# Patient Record
Sex: Female | Born: 1935 | Race: White | Hispanic: No | Smoking: Never smoker
Health system: Southern US, Community
[De-identification: ages and names within clinical notes are randomized; demographics above are authoritative.]

## PROBLEM LIST (undated history)

## (undated) DIAGNOSIS — M1990 Unspecified osteoarthritis, unspecified site: Secondary | ICD-10-CM

## (undated) DIAGNOSIS — F29 Unspecified psychosis not due to a substance or known physiological condition: Secondary | ICD-10-CM

## (undated) DIAGNOSIS — M4620 Osteomyelitis of vertebra, site unspecified: Secondary | ICD-10-CM

## (undated) DIAGNOSIS — Z87440 Personal history of urinary (tract) infections: Secondary | ICD-10-CM

## (undated) DIAGNOSIS — F419 Anxiety disorder, unspecified: Secondary | ICD-10-CM

## (undated) DIAGNOSIS — I499 Cardiac arrhythmia, unspecified: Secondary | ICD-10-CM

## (undated) DIAGNOSIS — Z85828 Personal history of other malignant neoplasm of skin: Secondary | ICD-10-CM

## (undated) DIAGNOSIS — IMO0002 Reserved for concepts with insufficient information to code with codable children: Secondary | ICD-10-CM

## (undated) DIAGNOSIS — Z8679 Personal history of other diseases of the circulatory system: Secondary | ICD-10-CM

## (undated) DIAGNOSIS — Z8639 Personal history of other endocrine, nutritional and metabolic disease: Secondary | ICD-10-CM

## (undated) DIAGNOSIS — D72829 Elevated white blood cell count, unspecified: Secondary | ICD-10-CM

## (undated) DIAGNOSIS — E871 Hypo-osmolality and hyponatremia: Secondary | ICD-10-CM

## (undated) DIAGNOSIS — M21371 Foot drop, right foot: Secondary | ICD-10-CM

## (undated) DIAGNOSIS — R197 Diarrhea, unspecified: Secondary | ICD-10-CM

## (undated) DIAGNOSIS — I739 Peripheral vascular disease, unspecified: Secondary | ICD-10-CM

## (undated) DIAGNOSIS — G6181 Chronic inflammatory demyelinating polyneuritis: Secondary | ICD-10-CM

## (undated) DIAGNOSIS — N393 Stress incontinence (female) (male): Secondary | ICD-10-CM

## (undated) DIAGNOSIS — K859 Acute pancreatitis without necrosis or infection, unspecified: Secondary | ICD-10-CM

## (undated) DIAGNOSIS — I341 Nonrheumatic mitral (valve) prolapse: Secondary | ICD-10-CM

## (undated) DIAGNOSIS — D649 Anemia, unspecified: Secondary | ICD-10-CM

## (undated) DIAGNOSIS — N12 Tubulo-interstitial nephritis, not specified as acute or chronic: Secondary | ICD-10-CM

## (undated) DIAGNOSIS — L89309 Pressure ulcer of unspecified buttock, unspecified stage: Secondary | ICD-10-CM

## (undated) DIAGNOSIS — I10 Essential (primary) hypertension: Secondary | ICD-10-CM

## (undated) DIAGNOSIS — A0472 Enterocolitis due to Clostridium difficile, not specified as recurrent: Secondary | ICD-10-CM

## (undated) DIAGNOSIS — K219 Gastro-esophageal reflux disease without esophagitis: Secondary | ICD-10-CM

## (undated) DIAGNOSIS — E119 Type 2 diabetes mellitus without complications: Secondary | ICD-10-CM

## (undated) DIAGNOSIS — I4891 Unspecified atrial fibrillation: Secondary | ICD-10-CM

## (undated) DIAGNOSIS — Z872 Personal history of diseases of the skin and subcutaneous tissue: Secondary | ICD-10-CM

## (undated) DIAGNOSIS — F339 Major depressive disorder, recurrent, unspecified: Secondary | ICD-10-CM

## (undated) DIAGNOSIS — M21372 Foot drop, left foot: Secondary | ICD-10-CM

## (undated) DIAGNOSIS — M316 Other giant cell arteritis: Secondary | ICD-10-CM

## (undated) DIAGNOSIS — R51 Headache: Secondary | ICD-10-CM

## (undated) DIAGNOSIS — H9190 Unspecified hearing loss, unspecified ear: Secondary | ICD-10-CM

## (undated) DIAGNOSIS — R5381 Other malaise: Secondary | ICD-10-CM

## (undated) DIAGNOSIS — R630 Anorexia: Secondary | ICD-10-CM

## (undated) DIAGNOSIS — R5383 Other fatigue: Secondary | ICD-10-CM

## (undated) DIAGNOSIS — IMO0001 Reserved for inherently not codable concepts without codable children: Secondary | ICD-10-CM

## (undated) DIAGNOSIS — Z5189 Encounter for other specified aftercare: Secondary | ICD-10-CM

## (undated) DIAGNOSIS — M545 Low back pain: Secondary | ICD-10-CM

## (undated) HISTORY — DX: Essential (primary) hypertension: I10

## (undated) HISTORY — DX: Major depressive disorder, recurrent, unspecified: F33.9

## (undated) HISTORY — DX: Other malaise: R53.81

## (undated) HISTORY — DX: Hypo-osmolality and hyponatremia: E87.1

## (undated) HISTORY — DX: Reserved for concepts with insufficient information to code with codable children: IMO0002

## (undated) HISTORY — PX: LUMBAR LAMINECTOMY: SHX95

## (undated) HISTORY — DX: Headache: R51

## (undated) HISTORY — DX: Other giant cell arteritis: M31.6

## (undated) HISTORY — DX: Diarrhea, unspecified: R19.7

## (undated) HISTORY — PX: OTHER SURGICAL HISTORY: SHX169

## (undated) HISTORY — DX: Unspecified osteoarthritis, unspecified site: M19.90

## (undated) HISTORY — PX: ABDOMINAL HYSTERECTOMY: SHX81

## (undated) HISTORY — DX: Anemia, unspecified: D64.9

## (undated) HISTORY — DX: Low back pain: M54.5

## (undated) HISTORY — PX: JOINT REPLACEMENT: SHX530

## (undated) HISTORY — DX: Tubulo-interstitial nephritis, not specified as acute or chronic: N12

## (undated) HISTORY — DX: Anorexia: R63.0

## (undated) HISTORY — DX: Other fatigue: R53.83

## (undated) HISTORY — DX: Pressure ulcer of unspecified buttock, unspecified stage: L89.309

## (undated) HISTORY — DX: Unspecified hearing loss, unspecified ear: H91.90

## (undated) HISTORY — DX: Elevated white blood cell count, unspecified: D72.829

## (undated) HISTORY — PX: CHOLECYSTECTOMY: SHX55

---

## 2008-06-21 ENCOUNTER — Encounter: Admission: RE | Admit: 2008-06-21 | Discharge: 2008-06-21 | Payer: Self-pay | Admitting: Family Medicine

## 2008-10-20 ENCOUNTER — Encounter: Payer: Self-pay | Admitting: Family Medicine

## 2008-11-17 ENCOUNTER — Ambulatory Visit: Payer: Self-pay | Admitting: Family Medicine

## 2008-11-17 DIAGNOSIS — I10 Essential (primary) hypertension: Secondary | ICD-10-CM

## 2008-11-17 DIAGNOSIS — M1990 Unspecified osteoarthritis, unspecified site: Secondary | ICD-10-CM | POA: Insufficient documentation

## 2008-11-17 DIAGNOSIS — H9190 Unspecified hearing loss, unspecified ear: Secondary | ICD-10-CM | POA: Insufficient documentation

## 2008-11-17 HISTORY — DX: Unspecified hearing loss, unspecified ear: H91.90

## 2008-11-17 HISTORY — DX: Unspecified osteoarthritis, unspecified site: M19.90

## 2008-11-17 HISTORY — DX: Essential (primary) hypertension: I10

## 2008-11-24 ENCOUNTER — Ambulatory Visit: Payer: Self-pay | Admitting: Family Medicine

## 2008-11-29 ENCOUNTER — Encounter: Payer: Self-pay | Admitting: Family Medicine

## 2008-12-19 ENCOUNTER — Ambulatory Visit: Payer: Self-pay | Admitting: Family Medicine

## 2008-12-19 DIAGNOSIS — R630 Anorexia: Secondary | ICD-10-CM

## 2008-12-19 DIAGNOSIS — R197 Diarrhea, unspecified: Secondary | ICD-10-CM

## 2008-12-19 HISTORY — DX: Diarrhea, unspecified: R19.7

## 2008-12-19 HISTORY — DX: Anorexia: R63.0

## 2008-12-20 LAB — CONVERTED CEMR LAB
Basophils Absolute: 0 10*3/uL (ref 0.0–0.1)
Basophils Relative: 0.3 % (ref 0.0–3.0)
Calcium: 8.2 mg/dL — ABNORMAL LOW (ref 8.4–10.5)
Creatinine, Ser: 0.8 mg/dL (ref 0.4–1.2)
Eosinophils Absolute: 0.2 10*3/uL (ref 0.0–0.7)
Eosinophils Relative: 2.3 % (ref 0.0–5.0)
GFR calc non Af Amer: 72.97 mL/min (ref >60)
Glucose, Bld: 99 mg/dL (ref 70–99)
Lymphocytes Relative: 17 % (ref 12.0–46.0)
MCHC: 35 g/dL (ref 30.0–36.0)
MCV: 91.9 fL (ref 78.0–100.0)
Monocytes Relative: 11.7 % (ref 3.0–12.0)
Neutro Abs: 6 10*3/uL (ref 1.4–7.7)
Potassium: 5.2 meq/L — ABNORMAL HIGH (ref 3.5–5.1)
RDW: 11.8 % (ref 11.5–14.6)
TSH: 1.45 microintl units/mL (ref 0.35–5.50)

## 2008-12-22 ENCOUNTER — Encounter: Payer: Self-pay | Admitting: Family Medicine

## 2008-12-23 ENCOUNTER — Ambulatory Visit: Payer: Self-pay | Admitting: Internal Medicine

## 2008-12-23 ENCOUNTER — Inpatient Hospital Stay (HOSPITAL_COMMUNITY): Admission: EM | Admit: 2008-12-23 | Discharge: 2008-12-26 | Payer: Self-pay | Admitting: Emergency Medicine

## 2008-12-25 ENCOUNTER — Encounter: Payer: Self-pay | Admitting: Speech Pathology

## 2008-12-26 ENCOUNTER — Encounter (INDEPENDENT_AMBULATORY_CARE_PROVIDER_SITE_OTHER): Payer: Self-pay | Admitting: Gastroenterology

## 2009-01-01 ENCOUNTER — Ambulatory Visit: Payer: Self-pay | Admitting: Family Medicine

## 2009-01-01 DIAGNOSIS — R5381 Other malaise: Secondary | ICD-10-CM

## 2009-01-01 DIAGNOSIS — R5383 Other fatigue: Secondary | ICD-10-CM

## 2009-01-01 DIAGNOSIS — E871 Hypo-osmolality and hyponatremia: Secondary | ICD-10-CM

## 2009-01-01 HISTORY — DX: Hypo-osmolality and hyponatremia: E87.1

## 2009-01-01 HISTORY — DX: Other malaise: R53.81

## 2009-01-01 LAB — CONVERTED CEMR LAB: Sodium: 138 meq/L (ref 135–145)

## 2009-01-02 ENCOUNTER — Telehealth: Payer: Self-pay | Admitting: Family Medicine

## 2009-01-02 ENCOUNTER — Ambulatory Visit: Payer: Self-pay | Admitting: Family Medicine

## 2009-01-15 ENCOUNTER — Encounter: Payer: Self-pay | Admitting: Internal Medicine

## 2009-01-16 ENCOUNTER — Ambulatory Visit: Payer: Self-pay | Admitting: Family Medicine

## 2009-01-17 LAB — CONVERTED CEMR LAB
Chloride: 100 meq/L (ref 96–112)
Creatinine, Ser: 0.8 mg/dL (ref 0.4–1.2)
Potassium: 3.9 meq/L (ref 3.5–5.1)
Sodium: 134 meq/L — ABNORMAL LOW (ref 135–145)

## 2009-01-18 ENCOUNTER — Telehealth: Payer: Self-pay | Admitting: Family Medicine

## 2009-01-19 ENCOUNTER — Telehealth (INDEPENDENT_AMBULATORY_CARE_PROVIDER_SITE_OTHER): Payer: Self-pay | Admitting: *Deleted

## 2009-01-22 ENCOUNTER — Ambulatory Visit: Payer: Self-pay | Admitting: Family Medicine

## 2009-01-24 ENCOUNTER — Telehealth: Payer: Self-pay | Admitting: Family Medicine

## 2009-01-31 ENCOUNTER — Encounter: Payer: Self-pay | Admitting: Family Medicine

## 2009-02-01 ENCOUNTER — Ambulatory Visit: Payer: Self-pay | Admitting: Family Medicine

## 2009-02-01 DIAGNOSIS — F339 Major depressive disorder, recurrent, unspecified: Secondary | ICD-10-CM

## 2009-02-01 DIAGNOSIS — L89309 Pressure ulcer of unspecified buttock, unspecified stage: Secondary | ICD-10-CM | POA: Insufficient documentation

## 2009-02-01 HISTORY — DX: Pressure ulcer of unspecified buttock, unspecified stage: L89.309

## 2009-02-01 HISTORY — DX: Major depressive disorder, recurrent, unspecified: F33.9

## 2009-02-12 ENCOUNTER — Telehealth: Payer: Self-pay | Admitting: Family Medicine

## 2009-02-14 ENCOUNTER — Encounter: Payer: Self-pay | Admitting: Internal Medicine

## 2009-02-15 ENCOUNTER — Telehealth: Payer: Self-pay | Admitting: Family Medicine

## 2009-03-02 ENCOUNTER — Ambulatory Visit: Payer: Self-pay | Admitting: Family Medicine

## 2009-03-02 ENCOUNTER — Telehealth (INDEPENDENT_AMBULATORY_CARE_PROVIDER_SITE_OTHER): Payer: Self-pay | Admitting: *Deleted

## 2009-03-14 ENCOUNTER — Telehealth (INDEPENDENT_AMBULATORY_CARE_PROVIDER_SITE_OTHER): Payer: Self-pay | Admitting: *Deleted

## 2009-03-20 ENCOUNTER — Telehealth: Payer: Self-pay | Admitting: Family Medicine

## 2009-04-16 ENCOUNTER — Ambulatory Visit: Payer: Self-pay | Admitting: Family Medicine

## 2009-04-24 ENCOUNTER — Telehealth: Payer: Self-pay | Admitting: Family Medicine

## 2009-05-24 ENCOUNTER — Telehealth: Payer: Self-pay | Admitting: Family Medicine

## 2009-05-29 ENCOUNTER — Encounter: Payer: Self-pay | Admitting: Family Medicine

## 2009-06-04 ENCOUNTER — Ambulatory Visit (HOSPITAL_COMMUNITY): Admission: RE | Admit: 2009-06-04 | Discharge: 2009-06-04 | Payer: Self-pay | Admitting: Surgery

## 2009-06-04 ENCOUNTER — Encounter (INDEPENDENT_AMBULATORY_CARE_PROVIDER_SITE_OTHER): Payer: Self-pay | Admitting: Surgery

## 2009-06-04 HISTORY — PX: TEMPORAL ARTERY BIOPSY / LIGATION: SUR132

## 2009-06-13 ENCOUNTER — Telehealth: Payer: Self-pay | Admitting: Family Medicine

## 2009-06-13 ENCOUNTER — Encounter: Payer: Self-pay | Admitting: Family Medicine

## 2009-06-28 ENCOUNTER — Telehealth: Payer: Self-pay | Admitting: Family Medicine

## 2009-07-20 ENCOUNTER — Encounter: Payer: Self-pay | Admitting: Family Medicine

## 2009-07-20 ENCOUNTER — Ambulatory Visit (HOSPITAL_COMMUNITY): Admission: RE | Admit: 2009-07-20 | Discharge: 2009-07-20 | Payer: Self-pay | Admitting: Rheumatology

## 2009-07-23 ENCOUNTER — Encounter (INDEPENDENT_AMBULATORY_CARE_PROVIDER_SITE_OTHER): Payer: Self-pay | Admitting: *Deleted

## 2009-07-30 ENCOUNTER — Encounter (INDEPENDENT_AMBULATORY_CARE_PROVIDER_SITE_OTHER): Payer: Self-pay | Admitting: *Deleted

## 2009-08-03 ENCOUNTER — Telehealth: Payer: Self-pay | Admitting: Family Medicine

## 2009-08-07 ENCOUNTER — Telehealth: Payer: Self-pay | Admitting: Family Medicine

## 2009-08-20 ENCOUNTER — Telehealth: Payer: Self-pay | Admitting: Family Medicine

## 2009-08-20 ENCOUNTER — Ambulatory Visit: Payer: Self-pay | Admitting: Family Medicine

## 2009-08-20 DIAGNOSIS — D72829 Elevated white blood cell count, unspecified: Secondary | ICD-10-CM

## 2009-08-20 DIAGNOSIS — D649 Anemia, unspecified: Secondary | ICD-10-CM

## 2009-08-20 DIAGNOSIS — M316 Other giant cell arteritis: Secondary | ICD-10-CM

## 2009-08-20 HISTORY — DX: Elevated white blood cell count, unspecified: D72.829

## 2009-08-20 HISTORY — DX: Other giant cell arteritis: M31.6

## 2009-08-20 HISTORY — DX: Anemia, unspecified: D64.9

## 2009-08-22 LAB — CONVERTED CEMR LAB
ALT: 22 units/L (ref 0–35)
AST: 21 units/L (ref 0–37)
Albumin: 3.4 g/dL — ABNORMAL LOW (ref 3.5–5.2)
BUN: 31 mg/dL — ABNORMAL HIGH (ref 6–23)
Basophils Absolute: 0.1 10*3/uL (ref 0.0–0.1)
Creatinine, Ser: 1.4 mg/dL — ABNORMAL HIGH (ref 0.4–1.2)
Eosinophils Relative: 0.1 % (ref 0.0–5.0)
Ferritin: 51.9 ng/mL (ref 10.0–291.0)
Glucose, Bld: 143 mg/dL — ABNORMAL HIGH (ref 70–99)
HCT: 31.7 % — ABNORMAL LOW (ref 36.0–46.0)
Hemoglobin: 10.4 g/dL — ABNORMAL LOW (ref 12.0–15.0)
Iron: 58 ug/dL (ref 42–145)
Lymphocytes Relative: 4.6 % — ABNORMAL LOW (ref 12.0–46.0)
Lymphs Abs: 0.9 10*3/uL (ref 0.7–4.0)
MCHC: 32.7 g/dL (ref 30.0–36.0)
MCV: 89.9 fL (ref 78.0–100.0)
Monocytes Absolute: 0.5 10*3/uL (ref 0.1–1.0)
Monocytes Relative: 2.4 % — ABNORMAL LOW (ref 3.0–12.0)
Neutro Abs: 17.4 10*3/uL — ABNORMAL HIGH (ref 1.4–7.7)
Neutrophils Relative %: 92.5 % — ABNORMAL HIGH (ref 43.0–77.0)
Potassium: 5.1 meq/L (ref 3.5–5.1)
RBC: 3.52 M/uL — ABNORMAL LOW (ref 3.87–5.11)
Total Bilirubin: 0.6 mg/dL (ref 0.3–1.2)
Total Protein: 6 g/dL (ref 6.0–8.3)
WBC: 18.9 10*3/uL (ref 4.5–10.5)

## 2009-08-30 ENCOUNTER — Telehealth: Payer: Self-pay | Admitting: Family Medicine

## 2009-09-04 ENCOUNTER — Inpatient Hospital Stay (HOSPITAL_COMMUNITY): Admission: EM | Admit: 2009-09-04 | Discharge: 2009-09-06 | Payer: Self-pay | Admitting: Emergency Medicine

## 2009-09-06 ENCOUNTER — Telehealth: Payer: Self-pay | Admitting: Family Medicine

## 2009-09-10 ENCOUNTER — Ambulatory Visit: Payer: Self-pay | Admitting: Family Medicine

## 2009-09-10 DIAGNOSIS — N12 Tubulo-interstitial nephritis, not specified as acute or chronic: Secondary | ICD-10-CM

## 2009-09-10 HISTORY — DX: Tubulo-interstitial nephritis, not specified as acute or chronic: N12

## 2009-09-10 LAB — CONVERTED CEMR LAB
Blood in Urine, dipstick: NEGATIVE
Nitrite: NEGATIVE
Protein, U semiquant: NEGATIVE
Specific Gravity, Urine: <1.005
Urobilinogen, UA: 0.2

## 2009-09-12 ENCOUNTER — Ambulatory Visit: Payer: Self-pay | Admitting: Family Medicine

## 2009-09-14 ENCOUNTER — Telehealth: Payer: Self-pay | Admitting: Family Medicine

## 2009-09-21 ENCOUNTER — Encounter: Payer: Self-pay | Admitting: Family Medicine

## 2009-09-24 ENCOUNTER — Telehealth: Payer: Self-pay | Admitting: Family Medicine

## 2009-09-24 ENCOUNTER — Ambulatory Visit: Payer: Self-pay | Admitting: Family Medicine

## 2009-09-24 LAB — CONVERTED CEMR LAB
CO2: 29 meq/L (ref 19–32)
Calcium: 8.9 mg/dL (ref 8.4–10.5)
Creatinine, Ser: 1 mg/dL (ref 0.4–1.2)
Eosinophils Absolute: 0.4 10*3/uL (ref 0.0–0.7)
Lymphocytes Relative: 9 % — ABNORMAL LOW (ref 12–46)
Lymphs Abs: 1.8 10*3/uL (ref 0.7–4.0)
MCHC: 35.6 g/dL (ref 30.0–36.0)
MCV: 88.9 fL (ref 78.0–100.0)
Monocytes Absolute: 0.6 10*3/uL (ref 0.1–1.0)
Monocytes Relative: 3 % (ref 3–12)
Neutro Abs: 16.9 10*3/uL — ABNORMAL HIGH (ref 1.7–7.7)
Platelets: 460 10*3/uL — ABNORMAL HIGH (ref 150–400)
Potassium: 4.3 meq/L (ref 3.5–5.1)
RDW: 18.9 % — ABNORMAL HIGH (ref 11.5–15.5)
Sed Rate: 13 mm/hr (ref 0–22)
WBC: 19.7 10*3/uL — ABNORMAL HIGH (ref 4.0–10.5)

## 2009-10-03 ENCOUNTER — Telehealth: Payer: Self-pay | Admitting: Family Medicine

## 2009-10-09 ENCOUNTER — Encounter: Payer: Self-pay | Admitting: Family Medicine

## 2009-10-09 ENCOUNTER — Telehealth: Payer: Self-pay | Admitting: Family Medicine

## 2009-10-10 ENCOUNTER — Encounter: Payer: Self-pay | Admitting: Family Medicine

## 2009-10-11 ENCOUNTER — Telehealth: Payer: Self-pay | Admitting: Family Medicine

## 2009-10-11 ENCOUNTER — Encounter: Payer: Self-pay | Admitting: Family Medicine

## 2009-10-16 ENCOUNTER — Telehealth: Payer: Self-pay | Admitting: Family Medicine

## 2009-10-19 ENCOUNTER — Telehealth: Payer: Self-pay | Admitting: Family Medicine

## 2009-10-24 ENCOUNTER — Encounter: Payer: Self-pay | Admitting: Family Medicine

## 2009-11-27 ENCOUNTER — Encounter: Payer: Self-pay | Admitting: Family Medicine

## 2010-01-07 ENCOUNTER — Encounter: Payer: Self-pay | Admitting: Family Medicine

## 2010-01-09 ENCOUNTER — Telehealth: Payer: Self-pay | Admitting: Family Medicine

## 2010-01-17 ENCOUNTER — Telehealth: Payer: Self-pay | Admitting: Family Medicine

## 2010-01-18 ENCOUNTER — Inpatient Hospital Stay (HOSPITAL_COMMUNITY)
Admission: EM | Admit: 2010-01-18 | Discharge: 2010-01-21 | Payer: Self-pay | Source: Ambulatory Visit | Admitting: Emergency Medicine

## 2010-02-14 ENCOUNTER — Ambulatory Visit: Payer: Self-pay | Admitting: Family Medicine

## 2010-02-21 ENCOUNTER — Telehealth: Payer: Self-pay | Admitting: Family Medicine

## 2010-02-23 ENCOUNTER — Encounter: Payer: Self-pay | Admitting: Family Medicine

## 2010-03-18 ENCOUNTER — Encounter: Payer: Self-pay | Admitting: Family Medicine

## 2010-03-18 ENCOUNTER — Telehealth: Payer: Self-pay | Admitting: Family Medicine

## 2010-03-27 ENCOUNTER — Encounter: Payer: Self-pay | Admitting: Family Medicine

## 2010-03-29 ENCOUNTER — Encounter: Payer: Self-pay | Admitting: Family Medicine

## 2010-04-02 ENCOUNTER — Ambulatory Visit: Payer: Self-pay | Admitting: Family Medicine

## 2010-04-04 ENCOUNTER — Ambulatory Visit: Payer: Self-pay | Admitting: Family Medicine

## 2010-04-16 ENCOUNTER — Ambulatory Visit: Payer: Self-pay | Admitting: Family Medicine

## 2010-04-16 DIAGNOSIS — IMO0002 Reserved for concepts with insufficient information to code with codable children: Secondary | ICD-10-CM

## 2010-04-16 HISTORY — DX: Reserved for concepts with insufficient information to code with codable children: IMO0002

## 2010-04-18 ENCOUNTER — Ambulatory Visit: Payer: Self-pay | Admitting: Family Medicine

## 2010-04-18 DIAGNOSIS — R51 Headache: Secondary | ICD-10-CM

## 2010-04-18 DIAGNOSIS — R519 Headache, unspecified: Secondary | ICD-10-CM | POA: Insufficient documentation

## 2010-04-18 HISTORY — DX: Headache: R51

## 2010-04-24 ENCOUNTER — Telehealth: Payer: Self-pay | Admitting: Family Medicine

## 2010-04-27 ENCOUNTER — Ambulatory Visit: Payer: Self-pay | Admitting: Family Medicine

## 2010-05-13 ENCOUNTER — Ambulatory Visit: Payer: Self-pay | Admitting: Family Medicine

## 2010-05-21 ENCOUNTER — Telehealth: Payer: Self-pay | Admitting: Family Medicine

## 2010-05-24 ENCOUNTER — Telehealth: Payer: Self-pay | Admitting: Family Medicine

## 2010-05-31 ENCOUNTER — Telehealth: Payer: Self-pay | Admitting: Family Medicine

## 2010-06-13 ENCOUNTER — Ambulatory Visit: Payer: Self-pay | Admitting: Family Medicine

## 2010-07-03 ENCOUNTER — Telehealth: Payer: Self-pay | Admitting: Family Medicine

## 2010-07-29 ENCOUNTER — Ambulatory Visit: Payer: Self-pay | Admitting: Family Medicine

## 2010-07-29 DIAGNOSIS — M545 Low back pain, unspecified: Secondary | ICD-10-CM

## 2010-07-29 HISTORY — DX: Low back pain, unspecified: M54.50

## 2010-08-02 ENCOUNTER — Telehealth: Payer: Self-pay | Admitting: Family Medicine

## 2010-08-29 ENCOUNTER — Telehealth: Payer: Self-pay | Admitting: Family Medicine

## 2010-09-09 ENCOUNTER — Ambulatory Visit
Admission: RE | Admit: 2010-09-09 | Discharge: 2010-09-09 | Payer: Self-pay | Source: Home / Self Care | Attending: Family Medicine | Admitting: Family Medicine

## 2010-09-09 ENCOUNTER — Other Ambulatory Visit: Payer: Self-pay | Admitting: Family Medicine

## 2010-09-09 DIAGNOSIS — T148XXA Other injury of unspecified body region, initial encounter: Secondary | ICD-10-CM | POA: Insufficient documentation

## 2010-09-09 LAB — BASIC METABOLIC PANEL
BUN: 20 mg/dL (ref 6–23)
CO2: 30 mEq/L (ref 19–32)
Calcium: 9.3 mg/dL (ref 8.4–10.5)
Chloride: 97 mEq/L (ref 96–112)
Creatinine, Ser: 0.7 mg/dL (ref 0.4–1.2)
GFR: 80.76 mL/min (ref >60.00)
Glucose, Bld: 75 mg/dL (ref 70–99)
Potassium: 3.8 mEq/L (ref 3.5–5.1)
Sodium: 135 mEq/L (ref 135–145)

## 2010-09-20 ENCOUNTER — Encounter: Payer: Self-pay | Admitting: Family Medicine

## 2010-09-20 ENCOUNTER — Ambulatory Visit
Admission: RE | Admit: 2010-09-20 | Discharge: 2010-09-20 | Payer: Self-pay | Source: Home / Self Care | Attending: Family Medicine | Admitting: Family Medicine

## 2010-09-26 ENCOUNTER — Encounter: Payer: Self-pay | Admitting: Family Medicine

## 2010-09-26 ENCOUNTER — Telehealth: Payer: Self-pay | Admitting: Family Medicine

## 2010-10-01 NOTE — Assessment & Plan Note (Signed)
Summary: rov/mm//infected boil on back.Marland Kitchenlh   Vital Signs:  Patient profile:   77 year old female Temp:     98.8 degrees F oral BP sitting:   170 / 100  (left arm) Cuff size:   regular  Vitals Entered By: Sid Falcon LPN (September 12, 2009 4:07 PM) CC: boil on buttock   History of Present Illness: Patient seen with ulceration buttock area. She has known pressure ulcers and followed by home health nurse. There is concern that one had a yellowish discoloration superiorly. Also they noticed a firm nodular density right buttock.  There was some concern about whether this was an abscess. No fever.  they are using some sort of topical cream for her superficial pressure ulcers and they plan to call back with the name of that. Her methotrexate was stopped by rheumatologist yesterday.  Allergies: 1)  ! Epinephrine Hcl (Epinephrine Hcl) 2)  ! Lotensin  Past History:  Past Medical History: Last updated: 08/20/2009 Arthritis Hypertension migraines Blood Tranfusion Mitral Valve Prolapse Osteoarthritis Peripheral neuropathy Temperal arteritis  Review of Systems      See HPI  Physical Exam  General:  Well-developed,well-nourished,in no acute distress; alert,appropriate and cooperative throughout examination Lungs:  Normal respiratory effort, chest expands symmetrically. Lungs are clear to auscultation, no crackles or wheezes. Heart:  normal rate and regular rhythm.   Skin:  patient has couple of this small superficial pressure ulcerations one near the lower sacral area which is approximately 1/2 by three-quarter centimeter in size. There is some yellowish exudative material but no purulent drainage  and no surrounding erythema. No fluctuance. She has a smaller ulcer which has almost fully healed left lower buttock   Impression & Recommendations:  Problem # 1:  PRESSURE ULCER BUTTOCK (ICD-707.05) No cellulitis or abscess findings on exam today.  Cont good topical wound care and  frequent shift of position.  Problem # 2:  LIPOMA (ICD-214.9) lipomatous mass R buttock appears benign.  They are reassurred this is not an abcess.  Complete Medication List: 1)  Neurontin 100 Mg Caps (Gabapentin) .... Two tabs at bedtime 2)  Lisinopril-hydrochlorothiazide 20-12.5 Mg Tabs (Lisinopril-hydrochlorothiazide) .... One tab daily 3)  Diazepam 5 Mg Tabs (Diazepam) .... One tab daily as needed 4)  Hydrocodone-acetaminophen 10-500 Mg Tabs (Hydrocodone-acetaminophen) .... One tab three times a day as needed 5)  Amlodipine Besylate 2.5 Mg Tabs (Amlodipine besylate) .... One by mouth once daily 6)  Calcium 500/d 500-200 Mg-unit Tabs (Calcium carbonate-vitamin d) .... Twice daily 7)  Imodium A-d 2 Mg Tabs (Loperamide hcl) .... As needed 8)  Butalbital-apap-caffeine 50-325-40 Mg Tabs (Butalbital-apap-caffeine) .... One tab every 6 hours as needed for headache 9)  Tagamet Hb 200 Mg Tabs (Cimetidine) .... One tab two times a day

## 2010-10-01 NOTE — Miscellaneous (Signed)
Summary: Certification and Plan of Care/[facility]  Certification and Plan of Care/[facility]   Imported By: Maryln Gottron 05/02/2010 10:58:06  _____________________________________________________________________  External Attachment:    Type:   Image     Comment:   External Document

## 2010-10-01 NOTE — Progress Notes (Signed)
Summary: refill  Phone Note Refill Request Message from:  Fax from Pharmacy  Refills Requested: Medication #1:  HYDROCODONE-ACETAMINOPHEN 10-500 MG TABS one tab three times a day as needed   Last Ref[facility]: [facility] [phone]     fax---[phone]  Initial call taken by: Warnell Forester,  September 06, 2009 12:50 PM  Follow-up for Phone Call        I checked with Ray, pharmicist at [facility].  He states thie request was a mistake, they do have 12/21 Rx for #90 with 1 refill. Follow-up by: Sid Falcon LPN,  September 07, 2009 10:18 AM

## 2010-10-01 NOTE — Progress Notes (Signed)
Summary: Diazepam refill request  Phone Note From Pharmacy   [facility]  [place] [address] Call For: Burchette/Fry  Summary of Call: Faxed request for Diazepam 5mg , last filled [facility]/04/10 [facility] [place] Initial call taken by: Sid Falcon LPN,  August 02, 2010 3:48 PM  Follow-up for Phone Call        call in #30 with no rf  Follow-up by: Nelwyn Salisbury MD,  August 02, 2010 5:30 PM  Additional Follow-up for Phone Call Additional follow up Details #1::        called Additional Follow-up by: Pura Spice, RN,  August 02, 2010 5:42 PM    Prescriptions: DIAZEPAM 5 MG TABS (DIAZEPAM) one tab daily as needed  #30 x 0   Entered by:   Pura Spice, RN   Authorized by:   Nelwyn Salisbury MD   Signed by:   Pura Spice, RN on 08/02/2010   Method used:   Telephoned to ...       [facility]  [place] [address] ([facility]ail)       [address]       [place], [place]  [address]       Ph: [phone] or [phone]       Fax: [phone]   RxID:   [id number]

## 2010-10-01 NOTE — Miscellaneous (Signed)
Summary: [facility]  [facility]   Imported By: Maryln Gottron 04/03/2010 10:04:34  _____________________________________________________________________  External Attachment:    Type:   Image     Comment:   External Document

## 2010-10-01 NOTE — Progress Notes (Signed)
Summary: Order for UA, given  Phone Note From Other Clinic   Caller: Nurse [facility] CARE) Neysa Bonito [phone] Request: Talk with Provider Summary of Call: Neysa Bonito, RN w/ [facility] called to adv that pt has been complaining of burning / discomfort while urinating,   frequency associated with same... would like to do a UA but has to have an order for same.Marland Kitchen...will be seeing pt around 10am this morning.... Can you advise order for UA to be done/taken by Neysa Bonito, RN w/ [facility]??   Initial call taken by: Debbra Riding,  October 09, 2009 8:35 AM  Follow-up for Phone Call        [facility] back, verbal order given.  UA will be sent to [facility] and we should have the results soon Follow-up by: Sid Falcon LPN,  October 09, 2009 8:46 AM  Additional Follow-up for Phone Call Additional follow up Details #1::        agree with advice. Additional Follow-up by: Evelena Peat MD,  October 09, 2009 12:35 PM

## 2010-10-01 NOTE — Medication Information (Signed)
Summary: Order for [facility]  Order for [facility]   Imported By: Maryln Gottron 02/27/2010 12:14:48  _____________________________________________________________________  External Attachment:    Type:   Image     Comment:   External Document

## 2010-10-01 NOTE — Miscellaneous (Signed)
Summary: Physician's Orders/[facility]  Physician's Orders/[facility]   Imported By: Maryln Gottron 10/12/2009 10:53:39  _____________________________________________________________________  External Attachment:    Type:   Image     Comment:   External Document

## 2010-10-01 NOTE — Assessment & Plan Note (Signed)
Summary: tb inj/njr  Nurse Visit   Allergies: 1)  ! Epinephrine Hcl (Epinephrine Hcl) 2)  ! Lotensin  Immunizations Administered:  PPD Skin Test:    Vaccine Type: PPD    Site: left forearm    Mfr: Sanofi Pasteur    Dose: 0.1 ml    Route: ID    Given by: Sid Falcon LPN    Exp. Date: 06/14/2011    Lot #: [id number]  Orders Added: 1)  TB Skin Test [86580] 2)  Admin 1st Vaccine [phone]

## 2010-10-01 NOTE — Letter (Signed)
Summary: TB Skin Test  All     ,     Phone:   Fax:           TB Skin Test    Jo Alvarez    Date TB Test Placed:  ______8/2/2011________  L or R forearm  TB Test Placed by:  _________[name] Neckers LPN__________  Lot #:  ____[id number]_        Expiration Date: _____10-13-2012________  Date TB Test Read:  _____8/4/2011_______________    Result ______<5 (neg)_____MM  TB Test Read by:  _____[name] Neckers LPN__________

## 2010-10-01 NOTE — Progress Notes (Signed)
Summary: [facility] req referal for Hearing Test  Phone Note From Other Clinic Call back at [facility]  [phone] [place]   Caller: [facility]   - [place] Summary of Call: Pt is needing to get an Audiology Referral or hearing test to determine change in hearing. Pls call [phone]     Initial call taken by: Lucy Antigua,  May 21, 2010 11:51 AM  Follow-up for Phone Call        OK to refer. Follow-up by: Evelena Peat MD,  May 21, 2010 1:10 PM

## 2010-10-01 NOTE — Progress Notes (Signed)
Summary: Refill Diazepam & Hydo-Acet  Phone Note From Pharmacy   Summary of Call: Ok to refill diazepam 5 mg? Last refill was on 12-19-09,on med list, last office vist 05-13-10 Ok to refill Hydrocodon-acetaminophn? 10-500 last refill: 04/24/10 Initial call taken by: Kathrynn Speed CMA,  May 31, 2010 3:04 PM  Follow-up for Phone Call        OK for both RF Follow-up by: Gordy Savers  MD,  May 31, 2010 3:15 PM    Prescriptions: DIAZEPAM 5 MG TABS (DIAZEPAM) one tab daily as needed  #30 x 0   Entered by:   Kathrynn Speed CMA   Authorized by:   Evelena Peat MD   Signed by:   Kathrynn Speed CMA on 05/31/2010   Method used:   Historical   RxID:   [id number] HYDROCODONE-ACETAMINOPHEN 10-500 MG TABS (HYDROCODONE-ACETAMINOPHEN) one tab three times a day as needed  #90 x 0   Entered by:   Kathrynn Speed CMA   Authorized by:   Evelena Peat MD   Signed by:   Kathrynn Speed CMA on 05/31/2010   Method used:   Historical   RxID:   [id number]

## 2010-10-01 NOTE — Assessment & Plan Note (Signed)
Summary: 2 WK ROV//SLM son rsc/njr   Vital Signs:  Patient profile:   77 year old female Weight:      141 pounds O2 Sat:      96 % on Room air Temp:     98.7 degrees F oral Pulse rate:   84 / minute Pulse rhythm:   regular BP sitting:   140 / 64  (left arm) BP standing:   132 / 60 Cuff size:   regular  Vitals Entered By: Sid Falcon LPN (September 10, 2009 10:00 AM)  O2 Flow:  Room air  Serial Vital Signs/Assessments:  Time      Position  BP       Pulse  Resp  Temp     By           Lying RA  168/70                         Evelena Peat MD           Sitting   160/70                         Evelena Peat MD           Standing  132/60                         Evelena Peat MD  CC: Post hospital visit   History of Present Illness: Hospital followup. Patient was admitted recently after a fall at home. Lab work revealed a pyuria and a urine culture grew out Escherichia coli. She was diagnosed with pyelonephritis. Discharged on Cipro and has one more day of therapy. Has some chronic urine frequency but no burning with urination.  Patient has temporal arteritis on chronic steroids also psoriatic arthritis on methotrexate. He does have some generalized weakness. Recent fall and had x-rays including lumbar spine films, pelvic x-ray, and head CT which showed no acute abnormalities. CT of the spine revealed degenerative spondylosis but no acute finding. Patient has home physical therapy arranged.  Major complaint now is generalized weakness. She feels methotrexate is making her feel worse. She has occasional lightheadedness but no syncope. Denies fever or chills. Has some chronic intermittent diarrhea which is unchanged. Having some left lower rib cage pain which has been brought up with a rheumatologist previously.  Chronic anemia and recent hosp d/c Hgb 7.6.  Pt due for labs and to see rheumatologist in 2 weeks.  Mild light headedness with standing but no sycope.  Allergies: 1)  !  Epinephrine Hcl (Epinephrine Hcl) 2)  ! Lotensin  Past History:  Past Medical History: Last updated: 08/20/2009 Arthritis Hypertension migraines Blood Tranfusion Mitral Valve Prolapse Osteoarthritis Peripheral neuropathy Temperal arteritis  Past Surgical History: Last updated: 12/28/2008 Cholecystectomy Total hip replacement Hysterectomy Total knee replacement Lumbar laminectomy Skin cancer MVP  Social History: Last updated: 11/17/2008 Retired Widow/Widower Former Smoker Alcohol use-no  Review of Systems  The patient denies anorexia, weight loss, weight gain, syncope, dyspnea on exertion, peripheral edema, prolonged cough, headaches, hemoptysis, abdominal pain, melena, hematochezia, severe indigestion/heartburn, and hematuria.    Physical Exam  General:  Well-developed,well-nourished,in no acute distress; alert,appropriate and cooperative throughout examination Ears:  External ear exam shows no significant lesions or deformities.  Otoscopic examination reveals clear canals, tympanic membranes are intact bilaterally without bulging, retraction, inflammation or discharge. Hearing is grossly normal bilaterally. Mouth:  Oral  mucosa and oropharynx without lesions or exudates.   Neck:  No deformities, masses, or tenderness noted. Lungs:  clear to auscultation. Symmetric breath sounds Heart:  normal rate, regular rhythm, and no gallop.   Abdomen:  soft and nontender. Extremities:  trace edema lower legs bil. Bil leg braces on. Neurologic:  alert & oriented X3 and cranial nerves II-XII intact.  no focal strength defecits.   Psych:  Oriented X3, normally interactive, good eye contact, and not anxious appearing.     Impression & Recommendations:  Problem # 1:  PYELONEPHRITIS (ICD-590.80) Assessment Improved  recheck UA.  Prompt f/u for any recurrent fever.  Orders: UA Dipstick w/o Micro (manual) (45409)  Problem # 2:  WEAKNESS (ICD-780.79) Likely multifactorial  (inactivity, anemia, meds, multiple medical problems).  High risk of falls.  Patient undergoing PT.  Recheck labs (BMP, ESR, CBC) in 2 weeks.  Problem # 3:  ANEMIA (ICD-285.9) chronic. Transfuse if hgb < 7.  Problem # 4:  TEMPORAL ARTERITIS (ICD-446.5) On prednisone and followed by rheumatology.  Problem # 5:  ACCIDENTAL FALLS, RECURRENT (ICD-E888.9) Cont walker at all times and PT.  patient instructed to change positions (eg sitting to walking) slowly.  Complete Medication List: 1)  Neurontin 100 Mg Caps (Gabapentin) .... Two tabs at bedtime 2)  Lisinopril-hydrochlorothiazide 20-12.5 Mg Tabs (Lisinopril-hydrochlorothiazide) .... One tab daily 3)  Diazepam 5 Mg Tabs (Diazepam) .... One tab daily as needed 4)  Hydrocodone-acetaminophen 10-500 Mg Tabs (Hydrocodone-acetaminophen) .... One tab three times a day as needed 5)  Amlodipine Besylate 2.5 Mg Tabs (Amlodipine besylate) .... One by mouth once daily 6)  Calcium 500/d 500-200 Mg-unit Tabs (Calcium carbonate-vitamin d) .... Once daily 7)  Calcium Citrate-vitamin D 250-50 Mg-unit Tabs (Calcium citrate-vitamin d) .... Once daily 8)  Arthrotec 75 75-200 Mg-mcg Tabs (Diclofenac-misoprostol) .... One by mouth two times a day 9)  Imodium A-d 2 Mg Tabs (Loperamide hcl) .... As needed 10)  Sucralfate 1 Gm Tabs (Sucralfate) .... Once daily 11)  Butalbital-apap-caffeine 50-325-40 Mg Tabs (Butalbital-apap-caffeine) .... One tab every 6 hours as needed for headache 12)  Tagamet Hb 200 Mg Tabs (Cimetidine) .... One tab two times a day 13)  Methotrexate 2.5 Mg Tabs (Methotrexate sodium)  Patient Instructions: 1)  Drink plenty of fluids 2)  Eat plenty of potassium rich foods such as fruits (bananas, oranges, strawberries, etc) 3)  Finish out Cipro and follow up immediately if you have any burning with urination or recurrent fever 4)  Get up from sitting or lying slowly to avoid sudden drop in blood pressure.  Laboratory Results   Urine  Tests    Routine Urinalysis   Color: yellow Appearance: Clear Glucose: negative   (Normal Range: Negative) Bilirubin: negative   (Normal Range: Negative) Ketone: negative   (Normal Range: Negative) Spec. Gravity: <1.005   (Normal Range: 1.003-1.035) Blood: negative   (Normal Range: Negative) pH: 6.0   (Normal Range: 5.0-8.0) Protein: negative   (Normal Range: Negative) Urobilinogen: 0.2   (Normal Range: 0-1) Nitrite: negative   (Normal Range: Negative) Leukocyte Esterace: small   (Normal Range: Negative)    Comments: Sid Falcon LPN  September 10, 2009 10:52 AM

## 2010-10-01 NOTE — Progress Notes (Signed)
Summary: [facility] orders update, continue home visits sacrum sores  Phone Note From Other Clinic   [facility]er[facility]ysa Bonito [phone] Call For: Luane Rochon Summary of Call: Pt Home health nurse from [facility] [phone]) reporting she has been applying Hydrocoloid dressings to pt sacrum.  This dressing tends to "roll" when pt slides down in bed.  Questioning if Dr [name] has another recommendation for pt sores.  Also, today is last approved [facility] visit.  Requesting weekly order X 4 weeks as sacrum sores are "about the same". Initial call taken by: Sid Falcon LPN,  October 03, 2009 3:40 PM  Follow-up for Phone Call        I would verbally extend her home visits another 4 weeks.  Have they tried a thinner hydrocolloid such as Tegaderm? Follow-up by: Evelena Peat MD,  October 04, 2009 8:45 AM  Additional Follow-up for Phone Call Additional follow up Details #1::        Verbal order to extend visits X 4 weeks given, pt sores actually looked better yesterday using only  a barrier cream.  Neysa Bonito will try tegaderm if next week the sores have not continued to improve. Additional Follow-up by: Sid Falcon LPN,  October 04, 2009 10:56 AM

## 2010-10-01 NOTE — Medication Information (Signed)
Summary: Folding Walker with wheels and seat attachment/[facility] stage manager with wheels and seat attachment/[facility]   Imported By: Maryln Gottron 11/29/2009 10:24:41  _____________________________________________________________________  External Attachment:    Type:   Image     Comment:   External Document

## 2010-10-01 NOTE — Progress Notes (Signed)
Summary: wants ov today  Phone Note Call from Patient Call back at [phone]   Caller: Son---live call Summary of Call: wants ov today. Having a lot of shoulder pain. Initial call taken by: Warnell Forester,  March 18, 2010 8:43 AM  Follow-up for Phone Call        Fluid in shoulder that [facility] is treating, and does not want to wait until tomorrow to see Dr. [name].  Already has a call in to [facility], will wait for them to call back for appt today. Follow-up by: Lynann Beaver CMA,  March 18, 2010 8:51 AM  Additional Follow-up for Phone Call Additional follow up Details #1::        noted Additional Follow-up by: Evelena Peat MD,  March 18, 2010 10:46 AM

## 2010-10-01 NOTE — Assessment & Plan Note (Signed)
Summary: 1 month fup//ccm   Vital Signs:  Patient profile:   77 year old female Weight:      132 pounds Temp:     98 degrees F oral BP sitting:   170 / 72  (left arm) BP standing:   140 / 68 Cuff size:   regular  Vitals Entered By: Sid Falcon LPN (June 13, 2010 10:11 AM)  Serial Vital Signs/Assessments:  Time      Position  BP       Pulse  Resp  Temp     By           Sitting   158/70                         Evelena Peat MD           Standing  140/68                         Evelena Peat MD   History of Present Illness: Patient for followup hypertension. Increased amlodipine to 10 mg last visit. No edema or other side effects. Not monitoring blood pressure. No orthostatic symptoms.  Hypertension History:      She denies headache, chest pain, palpitations, dyspnea with exertion, orthopnea, peripheral edema, visual symptoms, neurologic problems, syncope, and side effects from treatment.        Positive major cardiovascular risk factors include female age 44 years old or older and hypertension.  Negative major cardiovascular risk factors include non-tobacco-user status.      Allergies: 1)  ! Epinephrine Hcl (Epinephrine Hcl) 2)  ! Lotensin  Past History:  Past Medical History: Last updated: 08/20/2009 Arthritis Hypertension migraines Blood Tranfusion Mitral Valve Prolapse Osteoarthritis Peripheral neuropathy Temperal arteritis PMH reviewed for relevance  Review of Systems      See HPI  Physical Exam  General:  Well-developed,well-nourished,in no acute distress; alert,appropriate and cooperative throughout examination Mouth:  Oral mucosa and oropharynx without lesions or exudates.  Teeth in good repair. Neck:  No deformities, masses, or tenderness noted. Lungs:  Normal respiratory effort, chest expands symmetrically. Lungs are clear to auscultation, no crackles or wheezes. Heart:  normal rate and regular rhythm.   Extremities:  no edema   Impression  & Recommendations:  Problem # 1:  HYPERTENSION (ICD-401.9) Assessment Improved cont current meds. Her updated medication list for this problem includes:    Lisinopril-hydrochlorothiazide 20-12.5 Mg Tabs (Lisinopril-hydrochlorothiazide) ..... One tab daily    Amlodipine Besylate 10 Mg Tabs (Amlodipine besylate) ..... One by mouth once daily  Complete Medication List: 1)  Neurontin 100 Mg Caps (Gabapentin) .... One by mouth once daily 2)  Lisinopril-hydrochlorothiazide 20-12.5 Mg Tabs (Lisinopril-hydrochlorothiazide) .... One tab daily 3)  Diazepam 5 Mg Tabs (Diazepam) .... One tab daily as needed 4)  Hydrocodone-acetaminophen 10-500 Mg Tabs (Hydrocodone-acetaminophen) .... One tab three times a day as needed 5)  Amlodipine Besylate 10 Mg Tabs (Amlodipine besylate) .... One by mouth once daily 6)  Calcium 500/d 500-200 Mg-unit Tabs (Calcium carbonate-vitamin d) .... Twice daily 7)  Butalbital-apap-caffeine 50-325-40 Mg Tabs (Butalbital-apap-caffeine) .... One tab every 6 hours as needed for headache 8)  Tagamet Hb 200 Mg Tabs (Cimetidine) .... One tab two times a day 9)  Sertraline Hcl 50 Mg Tabs (Sertraline hcl) .... One by mouth once daily 10)  Prednisone 5 Mg Tabs (Prednisone) .... Take as directed by dr deveshwar  Other Orders: Flu Vaccine 43yrs +  MEDICARE PATIENTS [phone]) Administration Flu vaccine - MCR [phone]) Prescription Created Electronically [phone])  Hypertension Assessment/Plan:      The patient's hypertensive risk group is category B: At least one risk factor (excluding diabetes) with no target organ damage.  Today's blood pressure is 170/72.    Patient Instructions: 1)  Please schedule a follow-up appointment in 3 months .  Prescriptions: SERTRALINE HCL 50 MG TABS (SERTRALINE HCL) one by mouth once daily  #30 x 5   Entered and Authorized by:   Evelena Peat MD   Signed by:   Evelena Peat MD on 06/13/2010   Method used:   [facility]  [place] [address]       [address]       [place], [place]  [address]       Ph: [phone] or [phone]       Fax: [phone]   RxID:   [id number]   Flu Vaccine Consent Questions     Do you have a history of severe allergic reactions to this vaccine? no    Any prior history of allergic reactions to egg and/or gelatin? no    Do you have a sensitivity to the preservative Thimersol? no    Do you have a past history of Guillan-Barre Syndrome? no    Do you currently have an acute febrile illness? no    Have you ever had a severe reaction to latex? no    Vaccine information given and explained to patient? yes    Are you currently pregnant? no    Lot Number:[id number]   Exp Date:03/01/2011   Site Given  Left Deltoid IMdflu

## 2010-10-01 NOTE — Progress Notes (Signed)
Summary: Hydrocodone refill X 3 months  Phone Note From Pharmacy   [facility]  [place] [address] Call For: Jo Alvarez  Summary of Call: Faxed request to refill Hydrocodone Acetaminophin 10-500 Last filled 08-21-09 #90 with 1 refill Initial call taken by: Sid Falcon LPN,  October 19, 2009 4:17 PM  Follow-up for Phone Call        OK to refill for 3 months (ie once with 2 refills) Follow-up by: Evelena Peat MD,  October 19, 2009 4:52 PM  Additional Follow-up for Phone Call Additional follow up Details #1::        Rx called in Additional Follow-up by: Sid Falcon LPN,  October 22, 2009 8:27 AM    Prescriptions: HYDROCODONE-ACETAMINOPHEN 10-500 MG TABS (HYDROCODONE-ACETAMINOPHEN) one tab three times a day as needed  #90 x 2   Entered by:   Sid Falcon LPN   Authorized by:   Evelena Peat MD   Signed by:   Sid Falcon LPN on 78/29/5621   Method used:   Telephoned to ...       [facility]  [place] [address] ([facility]ail)       [address]       [place], [place]  [address]       Ph: [phone] or [phone]       Fax: [phone]   RxID:   [id number]

## 2010-10-01 NOTE — Miscellaneous (Signed)
Summary: FL-2  FL-2   Imported By: Maryln Gottron 04/01/2010 10:52:30  _____________________________________________________________________  External Attachment:    Type:   Image     Comment:   External Document

## 2010-10-01 NOTE — Progress Notes (Signed)
Summary: Approval request for Hydrocodone, caregiver on vacation  Phone Note From Pharmacy   [facility]  [place] [address] Call For: Jo Alvarez  Summary of Call: [facility] a [facility] requesting early refill authorization for Hydrocodone 10-500.   CAREGIVERS WILL BE ON VACATION WHEN THIS COMES DUE AGAIN, CAN DOCTOR GO AHEAD AND APPROVE??????? Initial call taken by: Sid Falcon LPN,  April 24, 2010 12:30 PM  Follow-up for Phone Call        OK to refill once. Follow-up by: Evelena Peat MD,  April 24, 2010 1:04 PM    Prescriptions: HYDROCODONE-ACETAMINOPHEN 10-500 MG TABS (HYDROCODONE-ACETAMINOPHEN) one tab three times a day as needed  #90 x 0   Entered by:   Sid Falcon LPN   Authorized by:   Evelena Peat MD   Signed by:   Sid Falcon LPN on 16/06/9603   Method used:   Telephoned to ...       [facility]  [place] [address] ([facility]ail)       [address]       [place], [place]  [address]       Ph: [phone] or [phone]       Fax: [phone]   RxID:   [id number]

## 2010-10-01 NOTE — Progress Notes (Signed)
Summary: return call re: pain med  Phone Note Call from Patient Call back at Home Phone [phone]   Caller: Patient---live call Summary of Call: returning nancy's call. Initial call taken by: Warnell Forester,  Jan 17, 2010 2:19 PM  Follow-up for Phone Call        Pt reports she has had terrible headaches lately, alot of problems with one of her eyes.  Explained reason for our call re: the Butalbital pain med.  Pt is going to schedule OV to see Dr [name] next week sometime as she would like to see him about several things. Follow-up by: Sid Falcon LPN,  Jan 17, 2010 2:32 PM

## 2010-10-01 NOTE — Assessment & Plan Note (Signed)
Summary: ROA/RCD   Vital Signs:  Patient profile:   77 year old female Temp:     97.8 degrees F oral BP sitting:   148 / 72  (left arm) Cuff size:   regular  Vitals Entered By: Sid Falcon LPN (April 18, 2010 9:28 AM)  History of Present Illness: Here for follow up forearm abscess.   I and D 2 days ago and feels better No fever or chills.  Dressing changed yesterday. Date of last tetanus unknown and needs booster.  Hx of freq headaches.  Has taken Esgic for years and requested refill yesterday. Discussed not overusing this.  Pt states she never got filled on 03-25-10 and son will confirm.  Allergies: 1)  ! Epinephrine Hcl (Epinephrine Hcl) 2)  ! Lotensin  Past History:  Past Medical History: Last updated: 08/20/2009 Arthritis Hypertension migraines Blood Tranfusion Mitral Valve Prolapse Osteoarthritis Peripheral neuropathy Temperal arteritis  Review of Systems      See HPI  Physical Exam  General:  Well-developed,well-nourished,in no acute distress; alert,appropriate and cooperative throughout examination Lungs:  Normal respiratory effort, chest expands symmetrically. Lungs are clear to auscultation, no crackles or wheezes. Heart:  normal rate and regular rhythm.   Extremities:  R forearm.  Great improvemetn in edema and less erythema.  Packing removed and some pus in cavity.  Cleaned with  peroxide and wound repacked.  Area of erythema greatly reduced.   Impression & Recommendations:  Problem # 1:  CELLULITIS AND ABSCESS OF UPPER ARM AND FOREARM (ICD-682.3) Assessment Improved wound repacked.  Family will remove in 2 days and wound care instruction given. Her updated medication list for this problem includes:    Cephalexin 500 Mg Caps (Cephalexin) ..... One by mouth three times a day for 7 days  Problem # 2:  HEADACHE (ICD-784.0) discussed over use of analgesics. Her updated medication list for this problem includes:    Hydrocodone-acetaminophen 10-500  Mg Tabs (Hydrocodone-acetaminophen) ..... One tab three times a day as needed    Butalbital-apap-caffeine 50-325-40 Mg Tabs (Butalbital-apap-caffeine) ..... One tab every 6 hours as needed for headache  Complete Medication List: 1)  Neurontin 100 Mg Caps (Gabapentin) .... One by mouth once daily 2)  Lisinopril-hydrochlorothiazide 20-12.5 Mg Tabs (Lisinopril-hydrochlorothiazide) .... One tab daily 3)  Diazepam 5 Mg Tabs (Diazepam) .... One tab daily as needed 4)  Hydrocodone-acetaminophen 10-500 Mg Tabs (Hydrocodone-acetaminophen) .... One tab three times a day as needed 5)  Amlodipine Besylate 5 Mg Tabs (Amlodipine besylate) .... One by mouth once daily 6)  Calcium 500/d 500-200 Mg-unit Tabs (Calcium carbonate-vitamin d) .... Twice daily 7)  Butalbital-apap-caffeine 50-325-40 Mg Tabs (Butalbital-apap-caffeine) .... One tab every 6 hours as needed for headache 8)  Tagamet Hb 200 Mg Tabs (Cimetidine) .... One tab two times a day 9)  Sertraline Hcl 50 Mg Tabs (Sertraline hcl) .... One by mouth once daily 10)  Prednisone 5 Mg Tabs (Prednisone) .... Take as directed by dr deveshwar 11)  Cephalexin 500 Mg Caps (Cephalexin) .... One by mouth three times a day for 7 days  Other Orders: TD Toxoids IM 7 YR + (01027) Admin 1st Vaccine (25366)  Patient Instructions: 1)  Pull packing in 2 days then clean daily with soap and water and topical antibiotic daily.   Immunizations Administered:  Tetanus Vaccine:    Vaccine Type: Td    Site: left deltoid    Mfr: Sanofi Pasteur    Dose: 0.5 ml    Route: IM    Given  by: Sid Falcon LPN    Exp. Date: 10/03/2011    Lot #: [id number]    VIS given: 07/20/07 version given April 18, 2010.

## 2010-10-01 NOTE — Assessment & Plan Note (Signed)
Summary: KNEE PAIN // RS   Vital Signs:  Patient profile:   77 year old female Temp:     98.0 degrees F oral BP sitting:   120 / 50  (left arm) Cuff size:   regular  Vitals Entered By: Sid Falcon LPN (July 29, 2010 1:28 PM)  History of Present Illness: Patient here with pain which radiates from right lower lumbar area toward right buttock and down to the knee. Present for couple of months and progressive. Sharp quality worse with walking sometimes relieved with sitting and extending the back. Has had mild relief with diazepam. Also takes hydrocodone. Pain is still intense at x8-10/10. Takes low-dose Neurontin 100 mg 2 at night.   Remote hx of lumbar laminectomy.   Allergies: 1)  ! Epinephrine Hcl (Epinephrine Hcl) 2)  ! Lotensin  Past History:  Past Medical History: Last updated: 08/20/2009 Arthritis Hypertension migraines Blood Tranfusion Mitral Valve Prolapse Osteoarthritis Peripheral neuropathy Temperal arteritis  Past Surgical History: Last updated: 12/28/2008 Cholecystectomy Total hip replacement Hysterectomy Total knee replacement Lumbar laminectomy Skin cancer MVP  Family History: Last updated: 11/17/2008 Family History of Arthritis Family History Hypertension Family History Lung cancer  Social History: Last updated: 11/17/2008 Retired Widow/Widower Former Smoker Alcohol use-no  Risk Factors: Smoking Status: quit (11/17/2008) PMH-FH-SH reviewed for relevance  Review of Systems  The patient denies anorexia, fever, weight loss, peripheral edema, and incontinence.    Physical Exam  General:  Well-developed,well-nourished,in no acute distress; alert,appropriate and cooperative throughout examination Neck:  No deformities, masses, or tenderness noted. Lungs:  Normal respiratory effort, chest expands symmetrically. Lungs are clear to auscultation, no crackles or wheezes. Heart:  normal rate and regular rhythm.   Extremities:  no edema.  She has some pain on the medial and lateral joint line right knee but no effusion, no erythema, and no warmth. Full range of motion of the knee. Straight leg raise is negative Neurologic:  patient has AFO brace on right lower extremity. She has chronic weakness with plantarflexion and dorsiflexion of the right Skin:  no rashes.     Impression & Recommendations:  Problem # 1:  BACK PAIN, LUMBAR (ICD-724.2) suspect radiculopathy symptoms.  Try increase neurontin to 100 mg AM and cont 200 mg at night.  Cont hydrocodone as needed. Consider ortho referral with prior hx surgery if no better in few weeks. Her updated medication list for this problem includes:    Hydrocodone-acetaminophen 10-500 Mg Tabs (Hydrocodone-acetaminophen) ..... One tab three times a day as needed    Butalbital-apap-caffeine 50-325-40 Mg Tabs (Butalbital-apap-caffeine) ..... One tab every 6 hours as needed for headache  Complete Medication List: 1)  Neurontin 100 Mg Caps (Gabapentin) .... One by mouth am and two by mouth at bedtime 2)  Lisinopril-hydrochlorothiazide 20-12.5 Mg Tabs (Lisinopril-hydrochlorothiazide) .... One tab daily 3)  Diazepam 5 Mg Tabs (Diazepam) .... One tab daily as needed 4)  Hydrocodone-acetaminophen 10-500 Mg Tabs (Hydrocodone-acetaminophen) .... One tab three times a day as needed 5)  Amlodipine Besylate 10 Mg Tabs (Amlodipine besylate) .... One by mouth once daily 6)  Calcium 500/d 500-200 Mg-unit Tabs (Calcium carbonate-vitamin d) .... Twice daily 7)  Butalbital-apap-caffeine 50-325-40 Mg Tabs (Butalbital-apap-caffeine) .... One tab every 6 hours as needed for headache 8)  Tagamet Hb 200 Mg Tabs (Cimetidine) .... One tab two times a day 9)  Sertraline Hcl 50 Mg Tabs (Sertraline hcl) .... One by mouth once daily 10)  Prednisone 5 Mg Tabs (Prednisone) .... Take as directed by dr Corliss Skains  Patient Instructions: 1)  Touch base in 2 weeks if back pain no better. 2)  We need to consider x-rays at  that time if no better. Prescriptions: NEURONTIN 100 MG CAPS (GABAPENTIN) one by mouth AM and two by mouth at bedtime  #90 x 5   Entered and Authorized by:   Evelena Peat MD   Signed by:   Evelena Peat MD on 07/29/2010   Method used:   [facility]        [facility]  [place] [address] [facility]       [address]       [place], [place]  [address]       Ph: [phone] or [phone]       Fax: [phone]   RxID:   [id number]    Orders Added: 1)  Est. Patient Level III [27741]

## 2010-10-01 NOTE — Assessment & Plan Note (Signed)
Summary: 3 month rov/njr   Vital Signs:  Patient profile:   77 year old female Weight:      131.50 pounds Temp:     98.2 degrees F oral BP sitting:   180 / 88  (left arm) BP standing:   158 / 64 Cuff size:   regular  Vitals Entered By: Sid Falcon LPN (May 13, 2010 9:38 AM)  Serial Vital Signs/Assessments:  Time      Position  BP       Pulse  Resp  Temp     By           Sitting   170/68                         Evelena Peat MD           Standing  158/64                         Evelena Peat MD   History of Present Illness: here for follow up hypertension.  Compliant with meds and denies any side effects.  Hypertension History:      She denies chest pain, palpitations, dyspnea with exertion, orthopnea, PND, peripheral edema, neurologic problems, syncope, and side effects from treatment.        Positive major cardiovascular risk factors include female age 43 years old or older and hypertension.  Negative major cardiovascular risk factors include non-tobacco-user status.     Allergies: 1)  ! Epinephrine Hcl (Epinephrine Hcl) 2)  ! Lotensin  Past History:  Past Medical History: Last updated: 08/20/2009 Arthritis Hypertension migraines Blood Tranfusion Mitral Valve Prolapse Osteoarthritis Peripheral neuropathy Temperal arteritis  Physical Exam  General:  Well-developed,well-nourished,in no acute distress; alert,appropriate and cooperative throughout examination Mouth:  Oral mucosa and oropharynx without lesions or exudates.  Teeth in good repair. Neck:  No deformities, masses, or tenderness noted. Lungs:  Normal respiratory effort, chest expands symmetrically. Lungs are clear to auscultation, no crackles or wheezes. Heart:  normal rate and regular rhythm.   Extremities:  no pitting edema.   Impression & Recommendations:  Problem # 1:  HYPERTENSION (ICD-401.9) Assessment Deteriorated  increase amlodipine to 10 mg by mouth once daily and one month  f/u Her updated medication list for this problem includes:    Lisinopril-hydrochlorothiazide 20-12.5 Mg Tabs (Lisinopril-hydrochlorothiazide) ..... One tab daily    Amlodipine Besylate 10 Mg Tabs (Amlodipine besylate) ..... One by mouth once daily  Orders: Prescription Created Electronically [id number])  Complete Medication List: 1)  Neurontin 100 Mg Caps (Gabapentin) .... One by mouth once daily 2)  Lisinopril-hydrochlorothiazide 20-12.5 Mg Tabs (Lisinopril-hydrochlorothiazide) .... One tab daily 3)  Diazepam 5 Mg Tabs (Diazepam) .... One tab daily as needed 4)  Hydrocodone-acetaminophen 10-500 Mg Tabs (Hydrocodone-acetaminophen) .... One tab three times a day as needed 5)  Amlodipine Besylate 10 Mg Tabs (Amlodipine besylate) .... One by mouth once daily 6)  Calcium 500/d 500-200 Mg-unit Tabs (Calcium carbonate-vitamin d) .... Twice daily 7)  Butalbital-apap-caffeine 50-325-40 Mg Tabs (Butalbital-apap-caffeine) .... One tab every 6 hours as needed for headache 8)  Tagamet Hb 200 Mg Tabs (Cimetidine) .... One tab two times a day 9)  Sertraline Hcl 50 Mg Tabs (Sertraline hcl) .... One by mouth once daily 10)  Prednisone 5 Mg Tabs (Prednisone) .... Take as directed by dr deveshwar 11)  Cephalexin 500 Mg Caps (Cephalexin) .... One by mouth three times a day for 7  days  Hypertension Assessment/Plan:      The patient's hypertensive risk group is category B: At least one risk factor (excluding diabetes) with no target organ damage.  Today's blood pressure is 180/88.    Patient Instructions: 1)  Please schedule a follow-up appointment in 1 month.  Prescriptions: AMLODIPINE BESYLATE 10 MG TABS (AMLODIPINE BESYLATE) one by mouth once daily  #30 x 5   Entered and Authorized by:   Evelena Peat MD   Signed by:   Evelena Peat MD on 05/13/2010   Method used:   [facility]        [facility]  [place] [address]       [address]       [place], [place]  [address]       Ph: [phone] or  [phone]       Fax: [phone]   RxID:   [id number]

## 2010-10-01 NOTE — Miscellaneous (Signed)
Summary: [facility]'s Orders/[facility]  Physician's Orders/[facility]   Imported By: Maryln Gottron 04/02/2010 12:33:30  _____________________________________________________________________  External Attachment:    Type:   Image     Comment:   External Document

## 2010-10-01 NOTE — Progress Notes (Signed)
Summary: Diazepam and Hydrocodone refill, last filled 9/30  Phone Note From Pharmacy   [facility]  [address] Call For: Burchette  Summary of Call: Pt pharmacy requesting refill on Diazepam #30 and Hydrocodone #90, last filled 05-31-2010 per Dr Amador Cunas. Initial call taken by: Sid Falcon LPN,  July 03, 2010 12:10 PM  Follow-up for Phone Call        may refill Hydrocodone #90 with 2 refill and refill diazepam once.  Follow-up by: Evelena Peat MD,  July 03, 2010 1:08 PM    Prescriptions: DIAZEPAM 5 MG TABS (DIAZEPAM) one tab daily as needed  #30 x 0   Entered by:   Sid Falcon LPN   Authorized by:   Evelena Peat MD   Signed by:   Sid Falcon LPN on 87/56/4332   Method used:   Telephoned to ...       [facility]  [place] [address] [facility]ail[facility]       [address]       [place], [place]  [address]       Ph: [phone] or [phone]       Fax: [phone]   RxID:   [id number] HYDROCODONE-ACETAMINOPHEN 10-500 MG TABS (HYDROCODONE-ACETAMINOPHEN) one tab three times a day as needed  #90 x 2   Entered by:   Sid Falcon LPN   Authorized by:   Evelena Peat MD   Signed by:   Sid Falcon LPN on 73/71/0626   Method used:   Telephoned to ...       [facility]  [place] [address] (retail)       [address]       [place], [place]  [address]       Ph: [phone] or [phone]       Fax: [phone]   RxID:   [id number]

## 2010-10-01 NOTE — Progress Notes (Signed)
Summary: early signs urine urgency, pressure  Phone Note From Other Clinic   Caller: Nurse [facility]  Call For: Roselind Klus Summary of Call: VM from [facility], Raynelle Fanning reporting pt has not been leaking urine like before, however she is C/O urine urge and pressure, no pain, burning or fever.  Questioning need for another UA, or take a wait and see approach? Initial call taken by: Sid Falcon LPN,  February 21, 2010 3:10 PM  Follow-up for Phone Call        Would repeat given her hx of recent infection and admission. Follow-up by: Evelena Peat MD,  February 21, 2010 5:42 PM  Additional Follow-up for Phone Call Additional follow up Details #1::        Spoke with pt, we will plan to collect urine tomorrow [facility] [phone], [facility] informed, they will collect tomorrow.  Pt informed Additional Follow-up by: Sid Falcon LPN,  February 21, 2010 5:59 PM

## 2010-10-01 NOTE — Progress Notes (Signed)
Summary: Bilateral carbon toe off ankle foot orthosis order  Phone Note Other Incoming Call back at [phone]   Caller: Son [name] Summary of Call: Son [name] sent a letter requesting a right and left carbon toe off ankle foot orthosis.  One of her braces broke, so it was determined she was due for bilateral new braces.  Rx written by Dr [name], faxed order, confirmation received, son informed on hpome phone Initial call taken by: Sid Falcon LPN,  May 24, 2010 10:58 AM

## 2010-10-01 NOTE — Assessment & Plan Note (Signed)
Summary: tb reading/njr  Nurse Visit   Allergies: 1)  ! Epinephrine Hcl (Epinephrine Hcl) 2)  ! Lotensin  PPD Results    Date of reading: 04/04/2010    Results: < 5mm    Interpretation: negative

## 2010-10-01 NOTE — Progress Notes (Signed)
Summary: butalbital request  Phone Note Call from Patient Call back at Home Phone [phone]   Call For: Ervey Fallin Summary of Call: Requests Esgic, generic butalbital, for terrible migraine headaches.  Harriett Sine said she'd check with him.  [facility] [place].  Requests it before the weekend. Initial call taken by: Rudy Jew, RN,  September 14, 2009 4:28 PM  Follow-up for Phone Call        OK to refill once. Follow-up by: Evelena Peat MD,  September 17, 2009 1:05 PM  Additional Follow-up for Phone Call Additional follow up Details #1::        Pt called to check on status of migraine med.Please call in to [facility] asap today.  Additional Follow-up by: Lucy Antigua,  September 17, 2009 2:43 PM    Additional Follow-up for Phone Call Additional follow up Details #2::    Pt informed Rx called in Follow-up by: Sid Falcon LPN,  September 17, 2009 4:42 PM  Prescriptions: BUTALBITAL-APAP-CAFFEINE 50-325-40 MG TAB[facility]) one tab every 6 hours as needed for headache  #30 x 0   Entered by:   Sid Falcon LPN   Authorized by:   Evelena Peat MD   Signed by:   Sid Falcon LPN on 09/81/1914   Method used:   Telephoned to ...       [facility]  [place] [address] (retail)       [address]       [place], [place]  [address]       Ph: [phone] or [phone]       Fax: [phone]   RxID:   [id number]

## 2010-10-01 NOTE — Medication Information (Signed)
Summary: Order for Wheelchair and Accessories/[facility]  Order for Wheelchair and Accessories/[facility]   Imported By: Maryln Gottron 03/28/2010 15:44:42  _____________________________________________________________________  External Attachment:    Type:   Image     Comment:   External Document

## 2010-10-01 NOTE — Letter (Signed)
Summary: [facility]   Imported By: Maryln Gottron 10/03/2009 15:40:50  _____________________________________________________________________  External Attachment:    Type:   Image     Comment:   External Document

## 2010-10-01 NOTE — Progress Notes (Signed)
Summary: CRITICAL VALUE  Phone Note From Other Clinic   Caller: [facility] Call For: Dr. [name] Action Taken: Provider Notified Details for Reason: CRITICAL VALUE Summary of Call: WBC:  18.6 Critical Value from [facility]. Initial call taken by: Lynann Beaver CMA,  September 24, 2009 1:38 PM  Follow-up for Phone Call        pt is on prednisone for temporal arteritis and this likely accounts for her leukocytosis.  If pt has any fever she would need prompt f/u to reassess otherwise we expect some WBC elevation. Follow-up by: Evelena Peat MD,  September 24, 2009 1:45 PM  Additional Follow-up for Phone Call Additional follow up Details #1::        Called pt, she reports she is in alot of pain; she is not running a fever.  FYI Additional Follow-up by: Sid Falcon LPN,  September 24, 2009 2:23 PM    Additional Follow-up for Phone Call Additional follow up Details #2::    noted Follow-up by: Evelena Peat MD,  September 24, 2009 2:57 PM

## 2010-10-01 NOTE — Assessment & Plan Note (Signed)
Summary: fup from [facility]m   Vital Signs:  Patient profile:   77 year old female Weight:      129 pounds O2 Sat:      95 % on Room air Temp:     98.7 degrees F oral BP sitting:   160 / 70  (left arm) Cuff size:   regular  Vitals Entered By: Sid Falcon LPN (February 14, 2010 11:28 AM)  O2 Flow:  Room air CC: post hospital visit   History of Present Illness: Patient has multiple medical problems including history of hypertension, chronic intermittent diarrhea, depression, temporal arteritis, and peripheral neuropathy and is seen for hospital followup.  Admitted in May with urinary tract infection with Escherichia coli. Patient treated with medication and discharged to rehabilitation. She has been generally weak and decreased ability to ambulate since admission but is slowly improving with physical therapy several times per week. She is getting close to using a walker again. Her Neurontin was reduced to 100 mg once daily. Norvasc increased to 5 mg daily. Patient remains on prednisone low dose per rheumatologist for temporal arteritis  Records reviewed from hospital and meds reviewed.  Allergies: 1)  ! Epinephrine Hcl (Epinephrine Hcl) 2)  ! Lotensin  Past History:  Past Medical History: Last updated: 08/20/2009 Arthritis Hypertension migraines Blood Tranfusion Mitral Valve Prolapse Osteoarthritis Peripheral neuropathy Temperal arteritis  Past Surgical History: Last updated: 12/28/2008 Cholecystectomy Total hip replacement Hysterectomy Total knee replacement Lumbar laminectomy Skin cancer MVP  Family History: Last updated: 11/17/2008 Family History of Arthritis Family History Hypertension Family History Lung cancer  Social History: Last updated: 11/17/2008 Retired Widow/Widower Former Smoker Alcohol use-no  Risk Factors: Smoking Status: quit (11/17/2008) PMH-FH-SH reviewed for relevance  Review of Systems       The patient complains of dyspnea  on exertion and muscle weakness.  The patient denies anorexia, fever, weight loss, vision loss, chest pain, syncope, peripheral edema, prolonged cough, headaches, hemoptysis, abdominal pain, melena, hematochezia, severe indigestion/heartburn, hematuria, and incontinence.    Physical Exam  General:  patient is alert cooperative and somewhat frail in appearance Head:  Normocephalic and atraumatic without obvious abnormalities. No apparent alopecia or balding. Eyes:  pupils equal, pupils round, and pupils reactive to light.   Ears:  External ear exam shows no significant lesions or deformities.  Otoscopic examination reveals clear canals, tympanic membranes are intact bilaterally without bulging, retraction, inflammation or discharge. Hearing is grossly normal bilaterally. Mouth:  Oral mucosa and oropharynx without lesions or exudates.  Teeth in good repair. Neck:  No deformities, masses, or tenderness noted. Lungs:  Normal respiratory effort, chest expands symmetrically. Lungs are clear to auscultation, no crackles or wheezes. Heart:  normal rate and regular rhythm.   Extremities:  patient has braces on both ankles and feet. No edema Neurologic:  alert & oriented X3 and cranial nerves II-XII intact.  unable to walk without assistance Psych:  normally interactive, good eye contact, not anxious appearing, and not depressed appearing.     Impression & Recommendations:  Problem # 1:  WEAKNESS (ICD-780.79) general deconditioning and will continue with PT.  Problem # 2:  HYPERTENSION (ICD-401.9)  Her updated medication list for this problem includes:    Lisinopril-hydrochlorothiazide 20-12.5 Mg Tabs (Lisinopril-hydrochlorothiazide) ..... One tab daily    Amlodipine Besylate 5 Mg Tabs (Amlodipine besylate) ..... One by mouth once daily  Problem # 3:  DEGENERATIVE JOINT DISEASE (ICD-715.90) cont hydrocodone as needed. Her updated medication list for this problem includes:  Hydrocodone-acetaminophen 10-500 Mg Tabs (Hydrocodone-acetaminophen) ..... One tab three times a day as needed    Butalbital-apap-caffeine 50-325-40 Mg Tabs (Butalbital-apap-caffeine) ..... One tab every 6 hours as needed for headache  Problem # 4:  TEMPORAL ARTERITIS (ICD-446.5) followed by rheum on pred taper.  Complete Medication List: 1)  Neurontin 100 Mg Caps (Gabapentin) .... One by mouth once daily 2)  Lisinopril-hydrochlorothiazide 20-12.5 Mg Tabs (Lisinopril-hydrochlorothiazide) .... One tab daily 3)  Diazepam 5 Mg Tabs (Diazepam) .... One tab daily as needed 4)  Hydrocodone-acetaminophen 10-500 Mg Tabs (Hydrocodone-acetaminophen) .... One tab three times a day as needed 5)  Amlodipine Besylate 5 Mg Tabs (Amlodipine besylate) .... One by mouth once daily 6)  Calcium 500/d 500-200 Mg-unit Tabs (Calcium carbonate-vitamin d) .... Twice daily 7)  Butalbital-apap-caffeine 50-325-40 Mg Tabs (Butalbital-apap-caffeine) .... One tab every 6 hours as needed for headache 8)  Tagamet Hb 200 Mg Tabs (Cimetidine) .... One tab two times a day 9)  Sertraline Hcl 50 Mg Tabs (Sertraline hcl) .... One by mouth once daily 10)  Prednisone 5 Mg Tabs (Prednisone) .... Take as directed by dr deveshwar  Patient Instructions: 1)  Continue physical therapy. 2)  Check your  Blood Pressure regularly . If it is above:140/90   you should make an appointment. 3)  Please schedule a follow-up appointment in 3 months .

## 2010-10-01 NOTE — Progress Notes (Signed)
Summary: Hydrocodone and Sertraline refill request  Phone Note From Pharmacy   [facility]  [place] [address] Call For: [place]  Summary of Call: Fax received fro pt pharmacy to refill Hydrocodone 10-500.  Last filled on 2/21 #90 with 2 refills Also Sertraline 50mg  one tab daily, med removed from active med list on 03/02/2009 Initial call taken by: Sid Falcon LPN,  Jan 09, 2010 1:51 PM  Follow-up for Phone Call        Need clarification.  Has pt been taking Sertraline? Ok to refill Hydrocodone for 3 months. Follow-up by: Evelena Peat MD,  Jan 10, 2010 9:15 AM  Additional Follow-up for Phone Call Additional follow up Details #1::        Called in Hydrocodone to pharmacist.     Per pharmacist, pt was given Sertraline, sig: one tab daily, #30 with 6 refills on 02/01/2009 by Dr [name]. Additional Follow-up by: Sid Falcon LPN,  Jan 10, 2010 9:26 AM    Additional Follow-up for Phone Call Additional follow up Details #2::    Will refill Sertraline and pt needs office f/u in one month to reassess.  She needs to take sertraline DAILY or not at all. Follow-up by: Evelena Peat MD,  Jan 10, 2010 9:35 AM  Additional Follow-up for Phone Call Additional follow up Details #3:: Details for Additional Follow-up Action Taken: Pt is taking Sertraline daily, also have trouble with her back, she will check with her son [name] for a ride and come for OV soon Additional Follow-up by: Sid Falcon LPN,  Jan 10, 2010 11:14 AM  New/Updated Medications: SERTRALINE HCL 50 MG TABS (SERTRALINE HCL) one by mouth once daily Prescriptions: SERTRALINE HCL 50 MG TABS (SERTRALINE HCL) one by mouth once daily  #30 x 5   Entered and Authorized by:   Evelena Peat MD   Signed by:   Evelena Peat MD on 01/10/2010   Method used:   [facility]  [place] [address]       [address]       [place], [place]  [address]       Ph: [phone] or [phone]       Fax:  [phone]   RxID:   [id number] HYDROCODONE-ACETAMINOPHEN 10-500 MG TABS (HYDROCODONE-ACETAMINOPHEN) one tab three times a day as needed  #90 x 2   Entered by:   Sid Falcon LPN   Authorized by:   Evelena Peat MD   Signed by:   Sid Falcon LPN on 74/25/9563   Method used:   Telephoned to ...       [facility]  [place] [address] (retail)       [address]       [place], [place]  [address]       Ph: [phone] or [phone]       Fax: [phone]   RxID:   [id number]

## 2010-10-01 NOTE — Miscellaneous (Signed)
Summary: Physician's Orders-Additional/[facility]  Physician's Orders-Additional/[facility]   Imported By: Maryln Gottron 10/12/2009 11:08:51  _____________________________________________________________________  External Attachment:    Type:   Image     Comment:   External Document

## 2010-10-01 NOTE — Medication Information (Signed)
Summary: Order for [facility]  Order for [facility]   Imported By: Maryln Gottron 03/19/2010 13:49:33  _____________________________________________________________________  External Attachment:    Type:   Image     Comment:   External Document

## 2010-10-01 NOTE — Assessment & Plan Note (Signed)
Summary: BOIL ON ARM/RCD   Vital Signs:  Patient profile:   77 year old female Temp:     99.0 degrees F oral BP sitting:   150 / 80  (left arm) Cuff size:   regular  Vitals Entered By: Sid Falcon LPN (April 16, 2010 11:39 AM)   History of Present Illness: Hx of reported "cyst" L forearm and 2 day ago noted rapid increase in swelling and  erythema.  Tender to palpation. No fever.  No relief with heating pad.  Allergies: 1)  ! Epinephrine Hcl (Epinephrine Hcl) 2)  ! Lotensin  Past History:  Past Medical History: Last updated: 08/20/2009 Arthritis Hypertension migraines Blood Tranfusion Mitral Valve Prolapse Osteoarthritis Peripheral neuropathy Temperal arteritis PMH reviewed for relevance  Physical Exam  General:  Well-developed,well-nourished,in no acute distress; alert,appropriate and cooperative throughout examination Lungs:  Normal respiratory effort, chest expands symmetrically. Lungs are clear to auscultation, no crackles or wheezes. Heart:  normal rate and regular rhythm.   Extremities:  R forearm area of erythema 5 by 10 cm.  Near center some induration and central area of fluctuance.  Tender to palpation.   Impression & Recommendations:  Problem # 1:  CELLULITIS AND ABSCESS OF UPPER ARM AND FOREARM (ICD-682.3) Assessment New Rec I and D and pt consented.  Prepped skin with betadine and anesth with plain xylocaine.  INcision with # 11 blade and expressed copious amt of pus.  Hemostats used to free loculations.  Packed with gauze.  Outer dressing applied.  Start Cephalexin for cellulitis changes. Her updated medication list for this problem includes:    Cephalexin 500 Mg Caps (Cephalexin) ..... One by mouth three times a day for 7 days  Orders: I&D Abscess, Complex (10061)  Complete Medication List: 1)  Neurontin 100 Mg Caps (Gabapentin) .... One by mouth once daily 2)  Lisinopril-hydrochlorothiazide 20-12.5 Mg Tabs (Lisinopril-hydrochlorothiazide)  .... One tab daily 3)  Diazepam 5 Mg Tabs (Diazepam) .... One tab daily as needed 4)  Hydrocodone-acetaminophen 10-500 Mg Tabs (Hydrocodone-acetaminophen) .... One tab three times a day as needed 5)  Amlodipine Besylate 5 Mg Tabs (Amlodipine besylate) .... One by mouth once daily 6)  Calcium 500/d 500-200 Mg-unit Tabs (Calcium carbonate-vitamin d) .... Twice daily 7)  Butalbital-apap-caffeine 50-325-40 Mg Tabs (Butalbital-apap-caffeine) .... One tab every 6 hours as needed for headache 8)  Tagamet Hb 200 Mg Tabs (Cimetidine) .... One tab two times a day 9)  Sertraline Hcl 50 Mg Tabs (Sertraline hcl) .... One by mouth once daily 10)  Prednisone 5 Mg Tabs (Prednisone) .... Take as directed by dr deveshwar 11)  Cephalexin 500 Mg Caps (Cephalexin) .... One by mouth three times a day for 7 days  Patient Instructions: 1)  Elevate arm frequently. 2)  Change outer dressing tomorrow. 3)  Follow up in 2 days for packing removal. 4)  Start anitibiotic today Prescriptions: CEPHALEXIN 500 MG CAPS (CEPHALEXIN) one by mouth three times a day for 7 days  #21 x 0   Entered and Authorized by:   Evelena Peat MD   Signed by:   Evelena Peat MD on 04/16/2010   Method used:   [facility]        [facility]  [place] [address] [facility]       [address]       [place], [place]  [address]       Ph: [phone] or [phone]       Fax: [phone]   RxID:  [id number]  

## 2010-10-01 NOTE — Miscellaneous (Signed)
Summary: Physician's Orders/[facility]  Physician's Orders/[facility]   Imported By: Maryln Gottron 10/26/2009 12:19:50  _____________________________________________________________________  External Attachment:    Type:   Image     Comment:   External Document

## 2010-10-01 NOTE — Progress Notes (Signed)
Summary: Butal-Apap refill request  Phone Note From Pharmacy   [facility]  [place] [address] [place] Call For: Laurelin Elson  Summary of Call: Pharmacy faxed Rx request ti refill Butal-Apap 325-Caff, #30, take one every 6 hours as needed headache Last filled 09/17/09 Initial call taken by: Sid Falcon LPN,  October 16, 2009 9:13 AM  Follow-up for Phone Call        may refill once. Follow-up by: Evelena Peat MD,  October 16, 2009 12:48 PM    Prescriptions: Sarita Haver 50-325-40 MG TABS [facility]) one tab every 6 hours as needed for headache  #30 x 0   Entered by:   Raechel Ache, RN   Authorized by:   Evelena Peat MD   Signed by:   Raechel Ache, RN on 10/16/2009   Method used:   [facility]        [facility]  [place] [address] [facility]ail[facility]       [address]       [place], [place]  [address]       Ph: [phone] or [phone]       Fax: [phone]   RxID:   [id number]

## 2010-10-01 NOTE — Progress Notes (Signed)
Summary: [facility] called FYI, pt fell at home, no injury  Phone Note From Other Clinic   Caller: [facility] nurse [phone] Summary of Call: [facility] nurse calling as an FYI, pt fell at home in her kitchen when she was by herself, no injury. Initial call taken by: Sid Falcon LPN,  October 11, 2009 4:37 PM  Follow-up for Phone Call        noted.   Hx of frequent falls and has had extensive PT. Follow-up by: Evelena Peat MD,  October 11, 2009 5:34 PM

## 2010-10-02 ENCOUNTER — Telehealth: Payer: Self-pay | Admitting: *Deleted

## 2010-10-02 NOTE — Telephone Encounter (Signed)
Notified pts son

## 2010-10-02 NOTE — Telephone Encounter (Signed)
Have her try miralax for the next couple of days as needed and increase fluid intake.

## 2010-10-02 NOTE — Telephone Encounter (Signed)
Son, [name] calls stating pt is constipated and has not had a BM in one week.  Sheiis taking some kind of stool softner, but they do not know what kind.  Asking for advice from Dr. [name], please.

## 2010-10-03 NOTE — Assessment & Plan Note (Signed)
Summary: 3 mnth rov//slm   Vital Signs:  Patient profile:   77 year old female Weight:      132 pounds Temp:     97.8 degrees F oral BP sitting:   142 / 80  (left arm) Cuff size:   regular  Vitals Entered By: Sid Falcon LPN (September 09, 2010 8:37 AM)  History of Present Illness: Here for several items  Laceration just this AM.  R wrist dorsally after hitting on wheelchair. No bony tenderness.  Minimal bleeding.  Last tetanus few months ago.  Hx hyponatremia.  Pt on hctz for hypertension.  No nausea.  Chronic LBP progressive.  Sharp to deep achy and poorly controlled on hydrocodone 10 three times a day.  Med helps but not lasting 8 hours. Pain 8/10 at times.  No radiculopathy signs.  Hypertension History:      She denies headache, chest pain, palpitations, dyspnea with exertion, peripheral edema, neurologic problems, syncope, and side effects from treatment.        Positive major cardiovascular risk factors include female age 4 years old or older and hypertension.  Negative major cardiovascular risk factors include non-tobacco-user status.     Allergies: 1)  ! Epinephrine Hcl (Epinephrine Hcl) 2)  ! Lotensin  Past History:  Past Medical History: Last updated: 08/20/2009 Arthritis Hypertension migraines Blood Tranfusion Mitral Valve Prolapse Osteoarthritis Peripheral neuropathy Temperal arteritis  Past Surgical History: Last updated: 12/28/2008 Cholecystectomy Total hip replacement Hysterectomy Total knee replacement Lumbar laminectomy Skin cancer MVP  Family History: Last updated: 11/17/2008 Family History of Arthritis Family History Hypertension Family History Lung cancer  Social History: Last updated: 11/17/2008 Retired Widow/Widower Former Smoker Alcohol use-no  Risk Factors: Smoking Status: quit (11/17/2008) PMH-FH-SH reviewed for relevance  Review of Systems  The patient denies anorexia, fever, weight loss, chest pain, syncope,  dyspnea on exertion, peripheral edema, hemoptysis, abdominal pain, melena, hematochezia, severe indigestion/heartburn, and hematuria.    Physical Exam  General:  Well-developed,well-nourished,in no acute distress; alert,appropriate and cooperative throughout examination Mouth:  Oral mucosa and oropharynx without lesions or exudates.  Teeth in good repair. Neck:  No deformities, masses, or tenderness noted. Lungs:  Normal respiratory effort, chest expands symmetrically. Lungs are clear to auscultation, no crackles or wheezes. Heart:  normal rate and regular rhythm.   Msk:  tender lower lumbar area. Extremities:  R wrist dorsally 5 cm laceration.  very thin skin. No bleeding. Neurologic:  alert & oriented X3 and cranial nerves II-XII intact.     Impression & Recommendations:  Problem # 1:  BACK PAIN, LUMBAR (ICD-724.2) Assessment Deteriorated  titrate hydrocodone to 10 mg qid  check films. Her updated medication list for this problem includes:    Hydrocodone-acetaminophen 10-325 Mg Tabs (Hydrocodone-acetaminophen) ..... One by mouth qid    Butalbital-apap-caffeine 50-325-40 Mg Tabs (Butalbital-apap-caffeine) ..... One tab every 6 hours as needed for headache  Orders: T-Lumbar Spine Complete, 5 Views (71110TC)  Problem # 2:  HYPONATREMIA (ICD-276.1)  Orders: Specimen Handling ([address]) Venipuncture (60454) TLB-BMP (Basic Metabolic Panel-BMET) (80048-METABOL)  Problem # 3:  LACERATION (ICD-879.8) very thin skin and  unable to suture. Steristrips applied.  Problem # 4:  HYPERTENSION (ICD-401.9)  Her updated medication list for this problem includes:    Lisinopril-hydrochlorothiazide 20-12.5 Mg Tabs (Lisinopril-hydrochlorothiazide) ..... One tab daily    Amlodipine Besylate 10 Mg Tabs (Amlodipine besylate) ..... One by mouth once daily  Complete Medication List: 1)  Neurontin 100 Mg Caps (Gabapentin) .... One by mouth am and two  by mouth at bedtime 2)   Lisinopril-hydrochlorothiazide 20-12.5 Mg Tabs (Lisinopril-hydrochlorothiazide) .... One tab daily 3)  Diazepam 5 Mg Tabs (Diazepam) .... One tab daily as needed 4)  Hydrocodone-acetaminophen 10-325 Mg Tabs (Hydrocodone-acetaminophen) .... One by mouth qid 5)  Amlodipine Besylate 10 Mg Tabs (Amlodipine besylate) .... One by mouth once daily 6)  Calcium 500/d 500-200 Mg-unit Tabs (Calcium carbonate-vitamin d) .... Twice daily 7)  Butalbital-apap-caffeine 50-325-40 Mg Tabs (Butalbital-apap-caffeine) .... One tab every 6 hours as needed for headache 8)  Tagamet Hb 200 Mg Tabs (Cimetidine) .... One tab two times a day 9)  Sertraline Hcl 50 Mg Tabs (Sertraline hcl) .... One by mouth once daily 10)  Prednisone 5 Mg Tabs (Prednisone) .... Take as directed by dr deveshwar  Hypertension Assessment/Plan:      The patient's hypertensive risk group is category B: At least one risk factor (excluding diabetes) with no target organ damage.  Today's blood pressure is 142/80.    Patient Instructions: 1)  Please schedule a follow-up appointment in 2 weeks.    Orders Added: 1)  Specimen Handling [99000] 2)  Venipuncture [36415] 3)  TLB-BMP (Basic Metabolic Panel-BMET) [80048-METABOL] 4)  T-Lumbar Spine Complete, 5 Views [71110TC] 5)  Est. Patient Level IV [62130]

## 2010-10-03 NOTE — Progress Notes (Signed)
Summary: Hydrocodone refill request, last filled 12/28  Phone Note From Pharmacy   [facility]  [place] [address] Call For: Jo Alvarez  Summary of Call: Faxed Rx request for Hydrocodone 10-500, one tab three times a day as needed pain current sig.  Pt looking for new script with an increase to 4 times per day".  Hydrocodone 10-325 is on med list. Initial call taken by: Sid Falcon LPN,  September 26, 2010 1:28 PM  Follow-up for Phone Call        refill Follow-up by: Evelena Peat MD,  September 26, 2010 5:19 PM  Additional Follow-up for Phone Call Additional follow up Details #1::        son aware ready for pick-up Additional Follow-up by: Sid Falcon LPN,  September 26, 2010 5:32 PM    Prescriptions: HYDROCODONE-ACETAMINOPHEN 10-325 MG TABS (HYDROCODONE-ACETAMINOPHEN) one by mouth qid  #120 x 2   Entered and Authorized by:   Evelena Peat MD   Signed by:   Evelena Peat MD on 09/26/2010   Method used:   Print then Give to Patient   RxID:   [id number]

## 2010-10-03 NOTE — Progress Notes (Signed)
Summary: refill diazepam  Phone Note Refill Request Message from:  Fax from Pharmacy on August 29, 2010 3:47 PM  Refills Requested: Medication #1:  DIAZEPAM 5 MG TABS one tab daily as needed   [facility]   Method Requested: Fax to Local Pharmacy Initial call taken by: Duard Brady LPN,  August 29, 2010 3:48 PM  Follow-up for Phone Call        refill for 3 months Follow-up by: Evelena Peat MD,  August 29, 2010 5:45 PM    Prescriptions: DIAZEPAM 5 MG TABS (DIAZEPAM) one tab daily as needed  #30 x 2   Entered by:   Duard Brady LPN   Authorized by:   Evelena Peat MD   Signed by:   Duard Brady LPN on 82/95/6213   Method used:   Historical   RxID:   [id number]  faxed back to [facility]

## 2010-10-03 NOTE — Assessment & Plan Note (Signed)
Summary: rov/mm   Vital Signs:  Patient profile:   77 year old female Weight:      139 pounds Temp:     97.8 degrees F oral BP sitting:   132 / 68  (left arm) Cuff size:   regular  Vitals Entered By: Sid Falcon LPN (September 20, 2010 9:58 AM)  History of Present Illness: Patient for followup. Recent right wrist laceration healing well. Steri-Strips still in place. No signs of infection.  Chronic low back pain severe at times. Hydrocodone helps but she still has an intense pain at times. Pain worse with movement and frequently interupting sleep. Frequent awakening. No constipation or other side effects with hydrocodone. We increased her hydrocodone from t.i.d. to q.i.d. recently. Still has breakthrough pain. Also takes low-dose Neurontin for neuropathy pains. Recent x-rays reviewed and showed diffuse degenerative spondylosis. No acute findings. Occasional right lower extremity radiculopathy symptoms. No incontinence.  Blood pressure stable with lisinopril HCTZ. No recent chest pains. No orthostatic symptoms. No recent falls.  Depression stable on Sertraline.  No suicidal ideation.  No med side effects.  Allergies: 1)  ! Epinephrine Hcl (Epinephrine Hcl) 2)  ! Lotensin  Past History:  Past Medical History: Last updated: 08/20/2009 Arthritis Hypertension migraines Blood Tranfusion Mitral Valve Prolapse Osteoarthritis Peripheral neuropathy Temperal arteritis  Past Surgical History: Last updated: 12/28/2008 Cholecystectomy Total hip replacement Hysterectomy Total knee replacement Lumbar laminectomy Skin cancer MVP  Family History: Last updated: 11/17/2008 Family History of Arthritis Family History Hypertension Family History Lung cancer  Social History: Last updated: 11/17/2008 Retired Widow/Widower Former Smoker Alcohol use-no  Risk Factors: Smoking Status: quit (11/17/2008) PMH-FH-SH reviewed for relevance  Review of Systems       no constipation on  pain meds.  Physical Exam  General:  Well-developed,well-nourished,in no acute distress; alert,appropriate and cooperative throughout examination Head:  normocephalic and atraumatic.   Mouth:  Oral mucosa and oropharynx without lesions or exudates.  Teeth in good repair. Neck:  No deformities, masses, or tenderness noted. Lungs:  Normal respiratory effort, chest expands symmetrically. Lungs are clear to auscultation, no crackles or wheezes. Heart:  normal rate and regular rhythm.   Extremities:  right wrist dorsally reveals Steri-Strips in place. Laceration healing well no signs of secondary infection. Neurologic:  alert & oriented X3 and cranial nerves II-XII intact.   Skin:  no rashes and no suspicious lesions.   Cervical Nodes:  No lymphadenopathy noted Psych:  Oriented X3, normally interactive, good eye contact, not anxious appearing, and not depressed appearing.     Impression & Recommendations:  Problem # 1:  BACK PAIN, LUMBAR (ICD-724.2) Discussed options.  Add tramadol for as needed use for breakthrough pain. If no relief with that we may need to look at other options such as oxycodone.  No med side effects at this time. Her updated medication list for this problem includes:    Hydrocodone-acetaminophen 10-325 Mg Tabs (Hydrocodone-acetaminophen) ..... One by mouth qid    Butalbital-apap-caffeine 50-325-40 Mg Tabs (Butalbital-apap-caffeine) ..... One tab every 6 hours as needed for headache    Tramadol Hcl 50 Mg Tabs (Tramadol hcl) .Marland Kitchen... 1-2 by mouth q 6 hours as needed pain  Problem # 2:  LACERATION (ICD-879.8) Assessment: Improved she will let steristrips fall off on their own.  Problem # 3:  DEPRESSION, MAJOR, RECURRENT (ICD-296.30)  Complete Medication List: 1)  Neurontin 100 Mg Caps (Gabapentin) .... One by mouth am and two by mouth at bedtime 2)  Lisinopril-hydrochlorothiazide 20-12.5 Mg Tabs (Lisinopril-hydrochlorothiazide) .Marland KitchenMarland KitchenMarland Kitchen  One tab daily 3)  Diazepam 5 Mg Tabs  (Diazepam) .... One tab daily as needed 4)  Hydrocodone-acetaminophen 10-325 Mg Tabs (Hydrocodone-acetaminophen) .... One by mouth qid 5)  Amlodipine Besylate 10 Mg Tabs (Amlodipine besylate) .... One by mouth once daily 6)  Calcium 500/d 500-200 Mg-unit Tabs (Calcium carbonate-vitamin d) .... Twice daily 7)  Butalbital-apap-caffeine 50-325-40 Mg Tabs (Butalbital-apap-caffeine) .... One tab every 6 hours as needed for headache 8)  Tagamet Hb 200 Mg Tabs (Cimetidine) .... One tab two times a day 9)  Sertraline Hcl 50 Mg Tabs (Sertraline hcl) .... One by mouth once daily 10)  Prednisone 5 Mg Tabs (Prednisone) .... Take as directed by dr deveshwar 11)  Tramadol Hcl 50 Mg Tabs (Tramadol hcl) .Marland Kitchen.. 1-2 by mouth q 6 hours as needed pain  Patient Instructions: 1)  Please schedule a follow-up appointment in 3 months .  Prescriptions: TRAMADOL HCL 50 MG TABS (TRAMADOL HCL) 1-2 by mouth q 6 hours as needed pain  #60 x 3   Entered and Authorized by:   Evelena Peat MD   Signed by:   Evelena Peat MD on 09/20/2010   Method used:   Electronically to        [facility]  [place] [address] (retail)       [address] [place] [address]       [place], [place]  [address]       Ph: [phone] or [phone]       Fax: [phone]   RxID:   [id number]    Orders Added: 1)  Est. Patient Level IV [56387]

## 2010-10-09 NOTE — Letter (Signed)
Summary: [facility]  [facility]   Imported By: Maryln Gottron 10/04/2010 13:42:42  _____________________________________________________________________  External Attachment:    Type:   Image     Comment:   External Document

## 2010-11-08 ENCOUNTER — Emergency Department (HOSPITAL_COMMUNITY): Payer: Medicare Other

## 2010-11-08 ENCOUNTER — Inpatient Hospital Stay (HOSPITAL_COMMUNITY)
Admission: EM | Admit: 2010-11-08 | Discharge: 2010-11-13 | DRG: 372 | Disposition: A | Discharge status: Skilled Nursing Facility | Payer: Medicare Other | Attending: Internal Medicine | Admitting: Internal Medicine

## 2010-11-08 DIAGNOSIS — M316 Other giant cell arteritis: Secondary | ICD-10-CM | POA: Diagnosis present

## 2010-11-08 DIAGNOSIS — K5641 Fecal impaction: Secondary | ICD-10-CM | POA: Diagnosis present

## 2010-11-08 DIAGNOSIS — I4891 Unspecified atrial fibrillation: Secondary | ICD-10-CM | POA: Diagnosis present

## 2010-11-08 DIAGNOSIS — A0472 Enterocolitis due to Clostridium difficile, not specified as recurrent: Principal | ICD-10-CM | POA: Diagnosis present

## 2010-11-08 DIAGNOSIS — E871 Hypo-osmolality and hyponatremia: Secondary | ICD-10-CM | POA: Diagnosis present

## 2010-11-08 DIAGNOSIS — IMO0002 Reserved for concepts with insufficient information to code with codable children: Secondary | ICD-10-CM

## 2010-11-08 DIAGNOSIS — M216X9 Other acquired deformities of unspecified foot: Secondary | ICD-10-CM | POA: Diagnosis present

## 2010-11-08 DIAGNOSIS — Z96659 Presence of unspecified artificial knee joint: Secondary | ICD-10-CM

## 2010-11-08 DIAGNOSIS — E876 Hypokalemia: Secondary | ICD-10-CM | POA: Diagnosis present

## 2010-11-08 DIAGNOSIS — Z96649 Presence of unspecified artificial hip joint: Secondary | ICD-10-CM

## 2010-11-08 DIAGNOSIS — I10 Essential (primary) hypertension: Secondary | ICD-10-CM | POA: Diagnosis present

## 2010-11-08 DIAGNOSIS — G609 Hereditary and idiopathic neuropathy, unspecified: Secondary | ICD-10-CM | POA: Diagnosis present

## 2010-11-08 LAB — CBC
HCT: 36.2 % (ref 36.0–46.0)
Hemoglobin: 11.8 g/dL — ABNORMAL LOW (ref 12.0–15.0)
MCHC: 32.6 g/dL (ref 30.0–36.0)
MCV: 94.8 fL (ref 78.0–100.0)
RBC: 3.82 MIL/uL — ABNORMAL LOW (ref 3.87–5.11)
WBC: 18.8 10*3/uL — ABNORMAL HIGH (ref 4.0–10.5)

## 2010-11-08 LAB — URINALYSIS, ROUTINE W REFLEX MICROSCOPIC
Glucose, UA: NEGATIVE mg/dL
Hgb urine dipstick: NEGATIVE
Ketones, ur: NEGATIVE mg/dL
Protein, ur: NEGATIVE mg/dL

## 2010-11-08 LAB — COMPREHENSIVE METABOLIC PANEL
ALT: 15 U/L (ref 0–35)
Calcium: 8.9 mg/dL (ref 8.4–10.5)
Chloride: 92 mEq/L — ABNORMAL LOW (ref 96–112)
GFR calc Af Amer: >60 mL/min (ref >60)
GFR calc non Af Amer: >60 mL/min (ref >60)
Potassium: 3.8 mEq/L (ref 3.5–5.1)
Sodium: 130 mEq/L — ABNORMAL LOW (ref 135–145)
Total Bilirubin: 0.9 mg/dL (ref 0.3–1.2)
Total Protein: 6 g/dL (ref 6.0–8.3)

## 2010-11-08 LAB — DIFFERENTIAL
Basophils Absolute: 0 10*3/uL (ref 0.0–0.1)
Basophils Relative: 0 % (ref 0–1)
Eosinophils Relative: 1 % (ref 0–5)
Monocytes Absolute: 1.1 10*3/uL — ABNORMAL HIGH (ref 0.1–1.0)

## 2010-11-08 LAB — LIPASE, BLOOD: Lipase: 20 U/L (ref 11–59)

## 2010-11-08 MED ORDER — IOHEXOL 300 MG/ML  SOLN
100.0000 mL | Freq: Once | INTRAMUSCULAR | Status: AC | PRN
  Administered 2010-11-08: 100 mL via INTRAVENOUS

## 2010-11-09 ENCOUNTER — Inpatient Hospital Stay (HOSPITAL_COMMUNITY): Payer: Medicare Other

## 2010-11-09 LAB — DIFFERENTIAL
Basophils Absolute: 0.1 10*3/uL (ref 0.0–0.1)
Basophils Relative: 1 % (ref 0–1)
Lymphocytes Relative: 13 % (ref 12–46)
Neutro Abs: 7.4 10*3/uL (ref 1.7–7.7)
Neutrophils Relative %: 74 % (ref 43–77)

## 2010-11-09 LAB — BASIC METABOLIC PANEL
CO2: 26 mEq/L (ref 19–32)
Calcium: 7.9 mg/dL — ABNORMAL LOW (ref 8.4–10.5)
GFR calc Af Amer: >60 mL/min (ref >60)
GFR calc non Af Amer: >60 mL/min (ref >60)
Glucose, Bld: 80 mg/dL (ref 70–99)
Potassium: 3.4 mEq/L — ABNORMAL LOW (ref 3.5–5.1)
Sodium: 131 mEq/L — ABNORMAL LOW (ref 135–145)

## 2010-11-09 LAB — CBC
HCT: 31.5 % — ABNORMAL LOW (ref 36.0–46.0)
Hemoglobin: 10.3 g/dL — ABNORMAL LOW (ref 12.0–15.0)
WBC: 10 10*3/uL (ref 4.0–10.5)

## 2010-11-09 LAB — URINE CULTURE
Culture  Setup Time: 201203091531
Culture: NO GROWTH

## 2010-11-10 LAB — BASIC METABOLIC PANEL
BUN: 8 mg/dL (ref 6–23)
Chloride: 106 mEq/L (ref 96–112)
Creatinine, Ser: 0.67 mg/dL (ref 0.4–1.2)
Glucose, Bld: 99 mg/dL (ref 70–99)
Potassium: 4.7 mEq/L (ref 3.5–5.1)

## 2010-11-11 LAB — BASIC METABOLIC PANEL
BUN: 6 mg/dL (ref 6–23)
CO2: 27 mEq/L (ref 19–32)
Calcium: 8.1 mg/dL — ABNORMAL LOW (ref 8.4–10.5)
Chloride: 101 mEq/L (ref 96–112)
Creatinine, Ser: 0.63 mg/dL (ref 0.4–1.2)
GFR calc Af Amer: >60 mL/min (ref >60)

## 2010-11-13 LAB — BASIC METABOLIC PANEL
BUN: 8 mg/dL (ref 6–23)
Calcium: 8.2 mg/dL — ABNORMAL LOW (ref 8.4–10.5)
Creatinine, Ser: 0.64 mg/dL (ref 0.4–1.2)
GFR calc Af Amer: >60 mL/min (ref >60)
GFR calc non Af Amer: >60 mL/min (ref >60)

## 2010-11-13 LAB — CBC
MCH: 30.8 pg (ref 26.0–34.0)
MCHC: 32.4 g/dL (ref 30.0–36.0)
MCV: 95 fL (ref 78.0–100.0)
Platelets: 278 10*3/uL (ref 150–400)
RDW: 12.6 % (ref 11.5–15.5)

## 2010-11-17 LAB — CULTURE, BLOOD (ROUTINE X 2): Culture: NO GROWTH

## 2010-11-17 LAB — URINE CULTURE

## 2010-11-17 LAB — DIFFERENTIAL
Basophils Absolute: 0 10*3/uL (ref 0.0–0.1)
Basophils Relative: 0 % (ref 0–1)
Eosinophils Absolute: 0 10*3/uL (ref 0.0–0.7)
Eosinophils Absolute: 0 10*3/uL (ref 0.0–0.7)
Eosinophils Absolute: 0.1 10*3/uL (ref 0.0–0.7)
Eosinophils Relative: 0 % (ref 0–5)
Eosinophils Relative: 0 % (ref 0–5)
Lymphocytes Relative: 5 % — ABNORMAL LOW (ref 12–46)
Lymphs Abs: 0.7 10*3/uL (ref 0.7–4.0)
Lymphs Abs: 1.7 10*3/uL (ref 0.7–4.0)
Monocytes Absolute: 0.2 10*3/uL (ref 0.1–1.0)
Monocytes Relative: 1 % — ABNORMAL LOW (ref 3–12)
Monocytes Relative: 2 % — ABNORMAL LOW (ref 3–12)
Monocytes Relative: 3 % (ref 3–12)
Neutrophils Relative %: 85 % — ABNORMAL HIGH (ref 43–77)

## 2010-11-17 LAB — BASIC METABOLIC PANEL
BUN: 17 mg/dL (ref 6–23)
BUN: 23 mg/dL (ref 6–23)
CO2: 26 mEq/L (ref 19–32)
Chloride: 104 mEq/L (ref 96–112)
Chloride: 108 mEq/L (ref 96–112)
Creatinine, Ser: 1.02 mg/dL (ref 0.4–1.2)
Creatinine, Ser: 1.11 mg/dL (ref 0.4–1.2)
GFR calc Af Amer: >60 mL/min (ref >60)
GFR calc non Af Amer: 47 mL/min — ABNORMAL LOW (ref >60)
Glucose, Bld: 112 mg/dL — ABNORMAL HIGH (ref 70–99)

## 2010-11-17 LAB — SEDIMENTATION RATE: Sed Rate: 35 mm/hr — ABNORMAL HIGH (ref 0–22)

## 2010-11-17 LAB — CBC
HCT: 24.3 % — ABNORMAL LOW (ref 36.0–46.0)
Hemoglobin: 8.1 g/dL — ABNORMAL LOW (ref 12.0–15.0)
MCHC: 33.5 g/dL (ref 30.0–36.0)
MCHC: 33.7 g/dL (ref 30.0–36.0)
MCV: 89.1 fL (ref 78.0–100.0)
MCV: 89.1 fL (ref 78.0–100.0)
RBC: 2.56 MIL/uL — ABNORMAL LOW (ref 3.87–5.11)
RBC: 2.73 MIL/uL — ABNORMAL LOW (ref 3.87–5.11)
RBC: 3.22 MIL/uL — ABNORMAL LOW (ref 3.87–5.11)
RDW: 20.5 % — ABNORMAL HIGH (ref 11.5–15.5)
RDW: 20.9 % — ABNORMAL HIGH (ref 11.5–15.5)
WBC: 13.7 10*3/uL — ABNORMAL HIGH (ref 4.0–10.5)

## 2010-11-17 LAB — COMPREHENSIVE METABOLIC PANEL
ALT: 21 U/L (ref 0–35)
AST: 22 U/L (ref 0–37)
CO2: 24 mEq/L (ref 19–32)
Calcium: 8.2 mg/dL — ABNORMAL LOW (ref 8.4–10.5)
GFR calc Af Amer: 54 mL/min — ABNORMAL LOW (ref >60)
Potassium: 3.5 mEq/L (ref 3.5–5.1)
Sodium: 133 mEq/L — ABNORMAL LOW (ref 135–145)
Total Protein: 5.7 g/dL — ABNORMAL LOW (ref 6.0–8.3)

## 2010-11-17 LAB — URINALYSIS, ROUTINE W REFLEX MICROSCOPIC
Glucose, UA: NEGATIVE mg/dL
Ketones, ur: NEGATIVE mg/dL
Nitrite: POSITIVE — AB
Protein, ur: NEGATIVE mg/dL
Urobilinogen, UA: 0.2 mg/dL (ref 0.0–1.0)

## 2010-11-17 LAB — FOLATE: Folate: >20 ng/mL

## 2010-11-17 LAB — LACTIC ACID, PLASMA: Lactic Acid, Venous: 2.2 mmol/L (ref 0.5–2.2)

## 2010-11-17 LAB — STOOL CULTURE

## 2010-11-17 LAB — BRAIN NATRIURETIC PEPTIDE: Pro B Natriuretic peptide (BNP): 674 pg/mL — ABNORMAL HIGH (ref 0.0–100.0)

## 2010-11-18 LAB — CBC
HCT: 29.6 % — ABNORMAL LOW (ref 36.0–46.0)
HCT: 30.9 % — ABNORMAL LOW (ref 36.0–46.0)
Hemoglobin: 10.3 g/dL — ABNORMAL LOW (ref 12.0–15.0)
Hemoglobin: 10.5 g/dL — ABNORMAL LOW (ref 12.0–15.0)
Hemoglobin: 9.7 g/dL — ABNORMAL LOW (ref 12.0–15.0)
MCHC: 32.6 g/dL (ref 30.0–36.0)
MCHC: 33 g/dL (ref 30.0–36.0)
MCHC: 33 g/dL (ref 30.0–36.0)
MCHC: 33.2 g/dL (ref 30.0–36.0)
MCV: 90.7 fL (ref 78.0–100.0)
MCV: 91.8 fL (ref 78.0–100.0)
Platelets: 314 10*3/uL (ref 150–400)
RBC: 2.77 MIL/uL — ABNORMAL LOW (ref 3.87–5.11)
RBC: 3.22 MIL/uL — ABNORMAL LOW (ref 3.87–5.11)
RBC: 3.51 MIL/uL — ABNORMAL LOW (ref 3.87–5.11)
RDW: 18.5 % — ABNORMAL HIGH (ref 11.5–15.5)
RDW: 18.6 % — ABNORMAL HIGH (ref 11.5–15.5)

## 2010-11-18 LAB — DIFFERENTIAL
Basophils Absolute: 0.4 10*3/uL — ABNORMAL HIGH (ref 0.0–0.1)
Basophils Relative: 2 % — ABNORMAL HIGH (ref 0–1)
Eosinophils Absolute: 0 10*3/uL (ref 0.0–0.7)
Eosinophils Relative: 0 % (ref 0–5)
Lymphs Abs: 1.5 10*3/uL (ref 0.7–4.0)
Monocytes Absolute: 0.7 10*3/uL (ref 0.1–1.0)
Monocytes Absolute: 1.5 10*3/uL — ABNORMAL HIGH (ref 0.1–1.0)
Monocytes Relative: 4 % (ref 3–12)
Monocytes Relative: 8 % (ref 3–12)
Neutro Abs: 14.5 10*3/uL — ABNORMAL HIGH (ref 1.7–7.7)
Neutro Abs: 15.1 10*3/uL — ABNORMAL HIGH (ref 1.7–7.7)
Neutrophils Relative %: 84 % — ABNORMAL HIGH (ref 43–77)

## 2010-11-18 LAB — COMPREHENSIVE METABOLIC PANEL
ALT: 16 U/L (ref 0–35)
AST: 22 U/L (ref 0–37)
Albumin: 3.3 g/dL — ABNORMAL LOW (ref 3.5–5.2)
Albumin: 3.4 g/dL — ABNORMAL LOW (ref 3.5–5.2)
Alkaline Phosphatase: 52 U/L (ref 39–117)
BUN: 24 mg/dL — ABNORMAL HIGH (ref 6–23)
BUN: 27 mg/dL — ABNORMAL HIGH (ref 6–23)
Calcium: 8.1 mg/dL — ABNORMAL LOW (ref 8.4–10.5)
Chloride: 96 mEq/L (ref 96–112)
GFR calc Af Amer: 44 mL/min — ABNORMAL LOW (ref >60)
Glucose, Bld: 113 mg/dL — ABNORMAL HIGH (ref 70–99)
Potassium: 4 mEq/L (ref 3.5–5.1)
Sodium: 130 mEq/L — ABNORMAL LOW (ref 135–145)
Sodium: 130 mEq/L — ABNORMAL LOW (ref 135–145)
Total Bilirubin: 0.8 mg/dL (ref 0.3–1.2)
Total Protein: 5.8 g/dL — ABNORMAL LOW (ref 6.0–8.3)
Total Protein: 6 g/dL (ref 6.0–8.3)

## 2010-11-18 LAB — BASIC METABOLIC PANEL
CO2: 28 mEq/L (ref 19–32)
Chloride: 98 mEq/L (ref 96–112)
GFR calc Af Amer: 45 mL/min — ABNORMAL LOW (ref >60)
Potassium: 3.9 mEq/L (ref 3.5–5.1)
Sodium: 135 mEq/L (ref 135–145)

## 2010-11-18 LAB — URINE CULTURE: Colony Count: >=100000

## 2010-11-18 LAB — URINALYSIS, MICROSCOPIC ONLY
Bilirubin Urine: NEGATIVE
Glucose, UA: NEGATIVE mg/dL
Ketones, ur: NEGATIVE mg/dL
Protein, ur: 30 mg/dL — AB

## 2010-11-18 LAB — URINALYSIS, ROUTINE W REFLEX MICROSCOPIC
Bilirubin Urine: NEGATIVE
Ketones, ur: NEGATIVE mg/dL
Nitrite: NEGATIVE
Specific Gravity, Urine: 1.013 (ref 1.005–1.030)
Urobilinogen, UA: 0.2 mg/dL (ref 0.0–1.0)

## 2010-11-18 LAB — URINE MICROSCOPIC-ADD ON

## 2010-11-18 LAB — CARDIAC PANEL(CRET KIN+CKTOT+MB+TROPI): Relative Index: INVALID (ref 0.0–2.5)

## 2010-11-18 LAB — CREATININE, URINE, RANDOM: Creatinine, Urine: 104.8 mg/dL

## 2010-11-30 ENCOUNTER — Other Ambulatory Visit: Payer: Self-pay | Admitting: Family Medicine

## 2010-12-06 LAB — BASIC METABOLIC PANEL
CO2: 25 mEq/L (ref 19–32)
Calcium: 8.7 mg/dL (ref 8.4–10.5)
GFR calc Af Amer: >60 mL/min (ref >60)
GFR calc non Af Amer: 56 mL/min — ABNORMAL LOW (ref >60)
Sodium: 131 mEq/L — ABNORMAL LOW (ref 135–145)

## 2010-12-06 LAB — CBC
Hemoglobin: 9.3 g/dL — ABNORMAL LOW (ref 12.0–15.0)
RBC: 3.47 MIL/uL — ABNORMAL LOW (ref 3.87–5.11)

## 2010-12-11 LAB — CORTISOL: Cortisol, Plasma: 13.7 ug/dL

## 2010-12-11 LAB — DIFFERENTIAL
Basophils Relative: 0 % (ref 0–1)
Eosinophils Absolute: 0.1 10*3/uL (ref 0.0–0.7)
Monocytes Absolute: 0.6 10*3/uL (ref 0.1–1.0)
Monocytes Relative: 8 % (ref 3–12)
Neutro Abs: 6.7 10*3/uL (ref 1.7–7.7)

## 2010-12-11 LAB — BASIC METABOLIC PANEL
BUN: 2 mg/dL — ABNORMAL LOW (ref 6–23)
BUN: 4 mg/dL — ABNORMAL LOW (ref 6–23)
BUN: 9 mg/dL (ref 6–23)
Calcium: 7.9 mg/dL — ABNORMAL LOW (ref 8.4–10.5)
Chloride: 104 mEq/L (ref 96–112)
Chloride: 110 mEq/L (ref 96–112)
Creatinine, Ser: 0.51 mg/dL (ref 0.4–1.2)
Creatinine, Ser: 0.69 mg/dL (ref 0.4–1.2)
GFR calc non Af Amer: >60 mL/min (ref >60)
GFR calc non Af Amer: >60 mL/min (ref >60)
Glucose, Bld: 105 mg/dL — ABNORMAL HIGH (ref 70–99)
Glucose, Bld: 115 mg/dL — ABNORMAL HIGH (ref 70–99)
Glucose, Bld: 147 mg/dL — ABNORMAL HIGH (ref 70–99)
Potassium: 2.6 mEq/L — CL (ref 3.5–5.1)
Potassium: 2.9 mEq/L — ABNORMAL LOW (ref 3.5–5.1)
Sodium: 128 mEq/L — ABNORMAL LOW (ref 135–145)
Sodium: 133 mEq/L — ABNORMAL LOW (ref 135–145)

## 2010-12-11 LAB — CBC
HCT: 26.8 % — ABNORMAL LOW (ref 36.0–46.0)
Hemoglobin: 11 g/dL — ABNORMAL LOW (ref 12.0–15.0)
Hemoglobin: 9.4 g/dL — ABNORMAL LOW (ref 12.0–15.0)
MCHC: 34.3 g/dL (ref 30.0–36.0)
MCV: 90.5 fL (ref 78.0–100.0)
Platelets: 432 10*3/uL — ABNORMAL HIGH (ref 150–400)
Platelets: 483 10*3/uL — ABNORMAL HIGH (ref 150–400)
Platelets: 492 10*3/uL — ABNORMAL HIGH (ref 150–400)
RDW: 12.2 % (ref 11.5–15.5)
RDW: 12.3 % (ref 11.5–15.5)
RDW: 12.6 % (ref 11.5–15.5)
WBC: 7.1 10*3/uL (ref 4.0–10.5)

## 2010-12-11 LAB — GIARDIA/CRYPTOSPORIDIUM SCREEN(EIA)
Cryptosporidium Screen (EIA): NEGATIVE
Giardia Screen - EIA: NEGATIVE

## 2010-12-11 LAB — COMPREHENSIVE METABOLIC PANEL
ALT: 9 U/L (ref 0–35)
Albumin: 2.7 g/dL — ABNORMAL LOW (ref 3.5–5.2)
Alkaline Phosphatase: 63 U/L (ref 39–117)
GFR calc Af Amer: >60 mL/min (ref >60)
Potassium: 3.8 mEq/L (ref 3.5–5.1)
Sodium: 123 mEq/L — ABNORMAL LOW (ref 135–145)
Total Protein: 5.3 g/dL — ABNORMAL LOW (ref 6.0–8.3)

## 2010-12-11 LAB — SEDIMENTATION RATE: Sed Rate: 22 mm/hr (ref 0–22)

## 2010-12-11 LAB — FECAL LACTOFERRIN, QUANT: Fecal Lactoferrin: POSITIVE

## 2010-12-11 LAB — NA AND K (SODIUM & POTASSIUM), RAND UR
Potassium Urine: 58 mEq/L
Sodium, Ur: 87 mEq/L

## 2010-12-11 LAB — CLOSTRIDIUM DIFFICILE EIA: C difficile Toxins A+B, EIA: NEGATIVE

## 2010-12-11 LAB — URINALYSIS, ROUTINE W REFLEX MICROSCOPIC
Glucose, UA: NEGATIVE mg/dL
Hgb urine dipstick: NEGATIVE
Specific Gravity, Urine: 1.011 (ref 1.005–1.030)

## 2010-12-11 LAB — POTASSIUM: Potassium: 3.5 mEq/L (ref 3.5–5.1)

## 2010-12-11 LAB — PROTEIN ELECTROPHORESIS, SERUM
Alpha-2-Globulin: 16 % — ABNORMAL HIGH (ref 7.1–11.8)
M-Spike, %: NOT DETECTED g/dL
Total Protein ELP: 5.2 g/dL — ABNORMAL LOW (ref 6.0–8.3)

## 2010-12-11 LAB — OSMOLALITY, URINE: Osmolality, Ur: 353 mOsm/kg — ABNORMAL LOW (ref 390–1090)

## 2010-12-18 ENCOUNTER — Encounter: Payer: Self-pay | Admitting: Family Medicine

## 2010-12-19 ENCOUNTER — Ambulatory Visit (INDEPENDENT_AMBULATORY_CARE_PROVIDER_SITE_OTHER): Payer: Medicare Other | Admitting: Family Medicine

## 2010-12-19 ENCOUNTER — Ambulatory Visit: Payer: Medicare Other | Admitting: Family Medicine

## 2010-12-19 ENCOUNTER — Encounter: Payer: Self-pay | Admitting: Family Medicine

## 2010-12-19 DIAGNOSIS — I10 Essential (primary) hypertension: Secondary | ICD-10-CM

## 2010-12-19 DIAGNOSIS — M545 Low back pain: Secondary | ICD-10-CM

## 2010-12-19 DIAGNOSIS — F339 Major depressive disorder, recurrent, unspecified: Secondary | ICD-10-CM

## 2010-12-19 NOTE — Patient Instructions (Signed)
Follow up Promptly for any recurrent diarrhea.

## 2010-12-19 NOTE — Progress Notes (Signed)
  Subjective:    Patient ID: Jo Alvarez, female    DOB: 01-Sep-1925, 77 y.o.   MRN: [id number]  HPI Patient seen for recent hospital followup. She had presented with some diarrhea and actually had some impaction. This was in relation to her chronic opioid use for chronic pain. She was determined to have Clostridium difficile colitis which was treated with Flagyl. She is back to baseline now. No recurrent diarrhea, abdominal cramps, or fever. No constipation since discharge. She is taking stool softeners daily and try to drink more fluids.  Her medications are reviewed. She had mild low sodium during hospitalization with sodium 132. Her lisinopril HCTZ was switched to Prinivil 20 mg twice a day. According to son, her blood pressure has been more elevated since making that change. They request going back on lisinopril HCTZ this time. She also remains on amlodipine 10 mg daily. She is taking hydrocodone 4 times daily. Also supplements with Ultram still some breakthrough pain at times. She has chronic low back pain and also peripheral neuropathy pains. Maintained on low dose Neurontin.  History of depression. Patient feels this is stable on sertraline 50 mg daily. We discussed other options such as Cymbalta given her neuropathy history   Review of Systems  Constitutional: Positive for fatigue. Negative for fever and appetite change.  HENT: Negative for trouble swallowing.   Respiratory: Negative for cough and wheezing.   Cardiovascular: Negative for chest pain and palpitations.  Genitourinary: Negative for dysuria.  Musculoskeletal: Positive for back pain and arthralgias.  Neurological: Negative for dizziness, syncope and headaches.  Hematological: Negative for adenopathy.  Psychiatric/Behavioral: Negative for confusion.       Objective:   Physical Exam  Constitutional: She is oriented to person, place, and time. She appears well-developed and well-nourished.  Neck: Neck supple. No  thyromegaly present.  Cardiovascular: Normal rate, regular rhythm and normal heart sounds.   Pulmonary/Chest: Breath sounds normal. She has no wheezes. She has no rales.  Musculoskeletal: She exhibits no edema.  Lymphadenopathy:    She has no cervical adenopathy.  Neurological: She is alert and oriented to person, place, and time. No cranial nerve deficit.          Assessment & Plan:  #1 recent C. difficile colitis resolved with no recurrent diarrhea #2 hypertension with marginal control.  Start back lisinopril HCTZ 20/12.5 one daily and check basic metabolic panel followup in 3 months to assess stability of Na. #3 history of depression continue sertraline 50 mg daily. Consider switch to Cymbalta if neuropathy pain worsens. #4 chronic pain syndrome. She is aware of risk of recurrent constipation with opioids. She has not been controlled with Ultram alone in the past. She is encouraged to take daily stool softener and plenty of fluids and fiber

## 2010-12-20 ENCOUNTER — Ambulatory Visit: Payer: Self-pay | Admitting: Family Medicine

## 2010-12-23 ENCOUNTER — Ambulatory Visit: Payer: Medicare Other | Admitting: Family Medicine

## 2010-12-24 ENCOUNTER — Ambulatory Visit: Payer: Medicare Other | Admitting: Family Medicine

## 2010-12-24 ENCOUNTER — Telehealth: Payer: Self-pay | Admitting: *Deleted

## 2010-12-24 ENCOUNTER — Encounter: Payer: Self-pay | Admitting: Family Medicine

## 2010-12-24 DIAGNOSIS — Z299 Encounter for prophylactic measures, unspecified: Secondary | ICD-10-CM

## 2010-12-24 DIAGNOSIS — Z111 Encounter for screening for respiratory tuberculosis: Secondary | ICD-10-CM

## 2010-12-24 MED ORDER — TUBERCULIN PPD 5 UNIT/0.1ML ID SOLN
5.0000 [IU] | Freq: Once | INTRADERMAL | Status: AC
  Administered 2010-12-24: 5 [IU] via INTRADERMAL

## 2010-12-24 NOTE — Telephone Encounter (Signed)
Pt here for ppd, needed another one this year to enter care facility.  Pt and son also requesting an order or letter to infom care facility staff she is able to dispense her own medications  They will return on Friday for ppd reading and to pick up order/letter, Andria Rhein LPN will assist in my absence

## 2010-12-24 NOTE — Telephone Encounter (Signed)
OK for patient to dispense her own medications.

## 2010-12-24 NOTE — Telephone Encounter (Signed)
Rx written and signed and pt will pick up on Friday when her ppd is read

## 2011-01-02 ENCOUNTER — Telehealth: Payer: Self-pay | Admitting: Family Medicine

## 2011-01-02 NOTE — Telephone Encounter (Signed)
Pt is sch to arrive at [facility] today, and need to get the completed and signed FL-2 form faxed back today, in order for pt to stay at facility. Pls send to efax # [phone]

## 2011-01-02 NOTE — Telephone Encounter (Signed)
Form on your desk  

## 2011-01-02 NOTE — Telephone Encounter (Signed)
FL2 filled out, faxed to [phone] as requested, confirmation received

## 2011-01-02 NOTE — Telephone Encounter (Signed)
Completed.

## 2011-01-14 NOTE — Consult Note (Signed)
Jo Alvarez, Jo Alvarez               ACCOUNT NO.:  [id number]   MEDICAL RECORD NO.:  [id number]          PATIENT TYPE:  INP   LOCATION:  1302                         FACILITY:  [facility]   PHYSICIAN:  Jordan Hawks. Elnoria Howard, MD    DATE OF BIRTH:  June 12, 1925   DATE OF CONSULTATION:  12/24/2008  DATE OF DISCHARGE:                                 CONSULTATION   REASON FOR CONSULTATION:  Diarrhea.   HISTORY OF PRESENT ILLNESS:  This is an 77 year old female with past  medical history of arthritis, hypertension and neuropathy who is  admitted to the hospital with complaints of diarrhea.  Apparently the  patient has had diarrhea for the past 3 months, although it appears that  her history seems to change as  there have been reports anywhere from 10  months to just 1 month ago.  She cannot recall exactly if her diarrhea  started acutely or if it was gradual.  She denies any sick contacts.  No  fevers or abdominal pain.  No recent antibiotic use.  She also denies  using any over-the-counter medications.  No foreign travel of late.  Per  the patient's history, she denies having any significant fevers.  On  average she will have approximately 10 to 12 bowel movements per day,  but she denies having any hematochezia or melena.  As a result of her  diarrhea, she developed hyponatremia with a sodium at 123 and  subsequently was admitted for further evaluation and treatment.  Additionally, the patient also has some issues with nausea and vomiting  but this does not appear to be persistent.   PAST MEDICAL AND SURGICAL HISTORY:  As stated above.   FAMILY HISTORY:  Significant for an uncle with colon cancer.   REVIEW OF SYSTEMS:  As stated above in history of present illness.  Otherwise negative.   SOCIAL HISTORY:  Negative for alcohol, tobacco or illicit drug use.   MEDICATIONS:  1. Lovenox 30 mg subcu daily.  2. Gabapentin 1000 mg p.o. b.i.d.  3. Hydrocortisone 100 mg IV every 8 hours.  4. Protonix 40  mg p.o. daily.  5. Valium 5 mg p.o. t.i.d. p.r.n.  6. Vicodin 1 tablet p.o. q.i.d. p.r.n.  7. Phenergan 12.5 mg IV every 6 hours p.r.n.  8. Ambien 5 mg p.o. nightly.   ALLERGIES:  EPINEPHRINE AND PENICILLIN.   PHYSICAL EXAMINATION:  VITAL SIGNS:  Blood pressure is 134/54, heart  rate is 79, respirations 18, temperature is 97.9.  GENERAL:  The patient is in no acute distress, alert and oriented.  HEENT:  Normocephalic, atraumatic.  Extraocular muscles intact.  NECK:  Supple.  No lymphadenopathy.  LUNGS:  Clear to auscultation bilaterally.  CARDIOVASCULAR:  Regular rate and rhythm.  ABDOMEN:  Flat, soft, nontender, nondistended.  Positive bowel sounds.  EXTREMITIES:  No clubbing, cyanosis or edema.  RECTAL EXAMINATION:  Negative for any masses.  There is some residual  stool in the rectum and is heme-positive.   LABORATORY VALUES:  White blood cell count 10.9, hemoglobin 11.0, MCV is  91.5, platelets at 492.  Sodium is 128,  potassium 4.0, chloride is 97,  CO2 is 24, glucose 105, BUN 9, creatinine 0.9.   IMPRESSION:  1. Diarrhea.  2. Heme-positive stool.  3. Inflammatory arthropathy on prednisone.   I am unable to identify any clear source of why she is having diarrhea  issues.  However with the heme-positive stool, she may have a colitis.  I agree with checking for Clostridium difficile.  No infectious  contacts.  However, there is current evidence that Clostridium difficile  can now be acquired in the community setting.  If the Clostridium  difficile is negative, a colonoscopy plus/minus  esophagogastroduodenoscopy will be performed.  Biopsies will be taken.  However, the biopsies will have to be taken cautiously as the patient is  on a moderate dose of steroids.  Inflammatory colitis can the a  possibility such as ulcerative colitis or Crohn's disease as it can show  up in this late age.   PLAN:  1. Colonoscopy plus/minus esophagogastroduodenoscopy once the sodium      is  corrected, given the preparation can result in a worsening of      the hyponatremia.  2. Agree with checking Clostridium difficile.      Jordan Hawks Elnoria Howard, MD  Electronically Signed     PDH/MEDQ  D:  12/24/2008  T:  12/24/2008  Job:  [id number]

## 2011-01-14 NOTE — Discharge Summary (Signed)
NAMEFAIGA, STONES               ACCOUNT NO.:  [id number]   MEDICAL RECORD NO.:  [id number]          PATIENT TYPE:  INP   LOCATION:  1302                         FACILITY:  [facility]   PHYSICIAN:  Rosalyn Gess. Norins, MD  DATE OF BIRTH:  July 04, 1925   DATE OF ADMISSION:  12/23/2008  DATE OF DISCHARGE:  12/26/2008                               DISCHARGE SUMMARY   ADMITTING DIAGNOSES:  1. Hyponatremia.  2. Chronic diarrhea of unknown etiology.  3. Acute nausea and vomiting, possibly viral.  4. Extreme weakness.  5. 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  6. Chronic inflammatory arthritis with marked negative rheumatoid      arthritis versus osteoarthritis.  7. Positive family history for gastrointestinal cancer.   DISCHARGE DIAGNOSES:  1. Hyponatremia.  2. Chronic diarrhea of unknown etiology.  3. Acute nausea and vomiting, possibly viral.  4. Extreme weakness.  5. 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  6. Chronic inflammatory arthritis with marked negative rheumatoid      arthritis versus osteoarthritis.  7. Positive family history for gastrointestinal cancer.   CONSULTANTS:  Dr. Elnoria Howard for gastroenterology.   PROCEDURE:  1. Two-view chest x-ray day of admission which showed cardiomegaly, no      acute cardiopulmonary abnormality.  Severe left shoulder      arthropathy.  2. Upper endoscopy performed December 26, 2008 by Dr. Elnoria Howard which revealed      2-3 cm hiatal hernia.  Otherwise unremarkable study.  3. Colonoscopy with biopsy which revealed scattered large diverticula      of the sigmoid colon.  No evidence of masses, ulcerations      inflammation, polyps or vascular abnormalities.  Random colon      biopsies were obtained.   HISTORY OF PRESENT ILLNESS:  Jo Alvarez is an 77 year old woman  followed by Dr. [name] now with Lacey Jensen who has multiple  medical  problems.  She presented to the emergency department [facility] with persistent and worsening diarrhea over the 3 weeks prior  to admission with a history of diarrhea for several months, nausea and  vomiting since the night prior to admission.  She felt very weak on the  morning of admission thinking that she was dying.  In the emergency  department she was found to be hyponatremic and she is subsequently  admitted for treatment.  The patient reports that she had been having 10-  12 watery stools a day for several months' duration, but this has only  been brought to medical attention recently.  Outpatient stool tests were  negative.   Please see the H and P for past medical history, family history, social  history and admission physical exam.   HOSPITAL COURSE:  1. The patient was admitted to the general medical service.  She was      started on fluid replacement with IV normal saline 150 mL an hour      to correct her hyponatremia.  Her initial sodium was 123.  The      patient corrected readily with a sodium on the 25th of 128 and a      sodium on the 26th of 133.  The patient did become hypokalemic and      required both oral and IV replacement.  Her potassium on the      morning of the day of discharge was 2.6 but after 4 runs of IV      potassium plus oral potassium, her final potassium at 1500 hours      was 3.5 and normal.  The patient's electrolytes having been      resolved and her dehydration having been corrected, she felt much      better and was thought to be stable in this regard ready for      discharge.  2. Diarrhea.  The patient was seen in consultation by Dr. Elnoria Howard.  She      was heme-positive and hyponatremic.  He recommended replacement of      fluids, electrolytes and then schedule her for EGD and colonoscopy      as noted.  The studies were unrevealing.  The patient also had      clostridium difficile toxin checked twice and was negative x2.      Fecal  fat was checked and was negative.  Stool for cryptosporidium      and Giardia was negative.  Fecal lactoferrin was positive on the      27th.  B12 was 1878.  Thyroid function was normal at 1.854.  At      this time, the patient has only had 1 bowel movement today and she      had decreasing diarrhea over the last 2 days.  With the patient      having had a full endoscopic evaluation with initial laboratory      returning as normal, she is ready for discharge home to follow with      Dr. Elnoria Howard as an outpatient.  3. Severe weakness.  The patient feels much better at the time of      discharge dictation.  She was able to ambulate during the day with      the use of a rolling walker and her leg braces, which is a chronic      condition.  4. Rheumatology.  The patient has been on intermittent steroids in the      past and therefore with this admission she was bolused with      steroids.  Random cortisol level was checked on the 75th and was a      normal range 13.7.  Sedimentation rate was checked this admission      and was normal at 22, total creatinine kinase was checked and was      normal at 26, no additional rheumatologic labs were ordered.  5. The patient's electrolytes being resolved, with her diarrhea being      controlled with her acute evaluation being completed, with the      patient feeling stronger with her being able to take a diet, she is      now ready for discharge this p.m.   DISCHARGE EXAMINATION:  VITAL SIGNS:  At 1350 hours with a temperature  of 98.7, blood pressure 106/69, heart rate 72, respirations 18, O2 sats  98% on room air.  GENERAL APPEARANCE:  This is a pleasant, chronically ill-appearing  Caucasian woman in bed who is  in no acute distress.  HEENT:  Exam with mild temporal wasting, otherwise unremarkable.  CHEST:  Patient is moving air well with no rales, wheezes or rhonchi.  CARDIOVASCULAR:  2+ radial pulse.  She had a quiet precordium with  regular rate and  rhythm.  ABDOMEN:  Soft with no guarding or rebound.  No tenderness was elicited.  No further examination was otherwise conducted.   FINAL LABORATORY:  Potassium 3.5, protein electrophoresis has been  performed with pathology interpretation saying that the possibility of  any restrictive advance cannot be completely excluded in the gamma  region.  Clostridium difficile was negative x2.  Final metabolic panel  from the day of discharge was sodium 137, chloride 110, pO2 of 23,  glucose 115, BUN 2, creatinine 0.5.  Fecal fat qualitative was normal.  Giardia Cryptosporidium stool studies were negative.  CBC April 26th was  unremarkable with a hemoglobin of 9.4, reflecting a chronic anemia with  a white count 7100, lactoferrin positive as noted.  B12 1878.  TSH 1.854  and cortisol 13.7.  ESR was 22.   DISCHARGE MEDICATIONS:  1. The patient will continue on her home medications including      Neurontin 200 mg q.h.s.  2. Lisinopril hydrochlorothiazide 20/12.5 once daily.  3. Diazepam 5 mg daily.  4. Prilosec 20 mg daily.  5. Hydrocodone APAP 10/500 t.i.d. p.r.n.  6. Norvasc 2.5 mg daily.  7. Calcium citrate daily.  8. Meloxicam 15 mg daily.  9. The patient may use antacid of choice.   DIET:  She may follow a diet of choice.   DISPOSITION:  The patient to return home.  She is to call to schedule  appointment with Dr. Elnoria Howard for followup for GI evaluation and she will  follow up with Dr. [name] for management of her other medical  problems including hypertension.   CONDITION AT TIME OF DISCHARGE DICTATION:  Stable and improved.      Rosalyn Gess Norins, MD  Electronically Signed     MEN/MEDQ  D:  12/26/2008  T:  12/26/2008  Job:  [id number]   cc:   Evelena Peat, M.D.   Jordan Hawks Elnoria Howard, MD  Fax: [phone]

## 2011-01-14 NOTE — Assessment & Plan Note (Signed)
[facility]                                 ON-CALL NOTE   NAME:Balash, [facility]                        MRN:          [id number]  DATE:12/23/2008                            DOB:          11/10/1925    PATIENT NAME:  Jo Alvarez.   DATE AND TIME:  December 23, 2008 at 8:52 a.m.   DATE OF BIRTH:  Jul 09, 1925.   PHONE NUMBER:  [phone]   REGULAR DOCTOR:  Evelena Peat, MD   CALLER:  Sherwood Gambler.   CHIEF COMPLAINT:  Weak.   The patient's caregiver states that she has been treated for diarrhea,  decreased appetite, nausea for a week or so.  She has had normal stool  studies, but in the she has been found to be mildly anemic with a very  mildly elevated potassium and low sodium level.  Today, her nausea and  diarrhea is worse.  She saw almost too weak to walk.  I instructed her  to take her to the emergency room for evaluation now, which is what she  is going to do.  She is likely dehydrated.     Marne A. Tower, MD  Electronically Signed    MAT/MedQ  DD: 12/23/2008  DT: 12/24/2008  Job #: [id number]

## 2011-01-14 NOTE — H&P (Signed)
Jo Alvarez, SPEAKER               ACCOUNT NO.:  [id number]   MEDICAL RECORD NO.:  [id number]          PATIENT TYPE:  INP   LOCATION:  1302                         FACILITY:  [facility]   PHYSICIAN:  [name], MDDATE OF BIRTH:  July 28, 1925   DATE OF ADMISSION:  12/23/2008  DATE OF DISCHARGE:                              HISTORY & PHYSICAL   CHIEF COMPLAINT:  Very weak.   HISTORY OF PRESENT ILLNESS:  The patient is an 77 year old female with  multiple medical problems who presented to ER with worsening diarrhea  over past 3 weeks (was having diarrhea for several months), had nausea  and vomiting since last night.  She was very weak this morning, thinking  that she was dying.  In the ER she was found to have low sodium and I  was asked to admit the patient.  She reports to me that she has been  having 10-12 watery stools a day of several months' duration, for some  reason has bothered her much, only lately she started to see Dr.  [name] for the diarrhea.  Apparently the stool tests were negative.  Main thing why she is here today is the weakness.  She denies blood in  the stool, fever, weight loss, abdominal pain, indigestion and other GI  symptoms.   PAST MEDICAL HISTORY:  Inflammatory arthritis, osteoarthritis versus  rheumatoid arthritis, hypertension, neuropathy currently motor and  sensory of many years' duration, as a result she has been having a lot  of weakness in the legs and hands and has to use a walker to ambulate.   ALLERGIES:  EPINEPHRINE and PENICILLIN.   CURRENT MEDICATIONS:  Neurontin, lisinopril, diazepam, Prilosec,  Vicodin, amlodipine, calcium and meloxicam.  She takes prednisone  periodically for arthritis flareup.  She was on Arthrotec which was  stopped recently.   FAMILY HISTORY:  Positive for multiple GI cancers.   SOCIAL HISTORY:  Lives at home with her husband.  Does not smoke or  drink alcohol.   REVIEW OF SYSTEMS:  Negative for chest pain  or shortness of breath.  Generalized weakness.  Fairly poor appetite.  Ambulates with a walker.  Chronic weakness in the legs.  She has to wear braces for foot drop.  Weakness in the hands.  Reports some falls over past year.  No head  injury, no headaches, no double vision, no indigestion, no early  satiety.  The rest of the 18 point review of systems is negative or as  above.   PHYSICAL EXAMINATION:  VITAL SIGNS:  Temperature 98.4, blood pressure  169/74, later rechecked at 130/56, heart rate 75, respirations 20, sats  99% on room air.  GENERAL:  An elderly female in no acute distress, appears tired.  HEENT:  With moist mucosa.  NECK:  Supple.  No meningeal signs.  No carotid bruit.  Thyroid normal.  LUNGS:  Clear.  No wheezes or rales.  HEART:  S1, S2.  Grade 2/6 systolic murmur.  No gallop.  ABDOMEN:  Soft, nontender, no organomegaly or mass felt.  LOWER EXTREMITIES:  Without edema.  Calves nontender.  Peripheral  pulses  normal.  SKIN:  With aging changes.  No pallor.  NEUROLOGICAL:  She is alert, oriented and cooperative.  Denies being  depressed.  Cranial nerves II-XII nonfocal.  Deep tendon reflexes  decreased.  She has visible muscle atrophy of the intercostal muscles,  weakness in feet extensors and flexors.   LABS:  White count 8.1, hemoglobin 10.9, MCV 92.7, platelets 483.  Urinalysis with negative leukocyte esterase.  Sodium 123, potassium 3.8,  glucose 111, creatinine 0.65, ALT and AST normal.   ASSESSMENT AND PLAN:  1. Hyponatremia, new, likely due to problem #2 and #3.  Will use IV      normal saline.  2. Chronic diarrhea, unclear etiology, differential diagnosis very      broad.  Will start with some stool tests and labs.  Will probably      call for gastrointestinal consult tomorrow unless she is fine and      wants to go home and work it up as an outpatient.  3. Acute nausea and vomiting.  Unclear etiology, possibly viral.  IV      fluids and Phenergan.  4.  Extreme weakness.  IV fluids.  I will cover with hydrocortisone IV      for stress.  5. 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  It has      been going on for about 10 years.  No etiology was found.  6. Chronic inflammatory arthritis.  Marker negative rheumatoid      arthritis versus osteoarthritis.  Receiving prednisone periodically      for that.  Her rheumatologist is Dr. Corliss Skains.  7. Multiple relatives with gastrointestinal cancer.  Will need a more      thorough gastrointestinal evaluation.  I will obtain chest x-ray      today.      [name], MD  Electronically Signed     AVP/MEDQ  D:  12/23/2008  T:  12/24/2008  Job:  [id number]

## 2011-01-30 ENCOUNTER — Other Ambulatory Visit: Payer: Self-pay | Admitting: Family Medicine

## 2011-02-06 ENCOUNTER — Telehealth: Payer: Self-pay | Admitting: *Deleted

## 2011-02-06 NOTE — Telephone Encounter (Signed)
Hydrocodone 10-325 refill request, last filled 01-07-2011   #120, taking one tab 4 times a day

## 2011-02-06 NOTE — Telephone Encounter (Signed)
Son [name] dropped off FL-2 Form needed for completion for [employer] beginning June 23 when they go on vacation.  Paper work on Dr [employer]

## 2011-02-07 ENCOUNTER — Other Ambulatory Visit: Payer: Self-pay | Admitting: *Deleted

## 2011-02-07 MED ORDER — HYDROCODONE-ACETAMINOPHEN 10-325 MG PO TABS: 1.0000 | ORAL_TABLET | Freq: Four times a day (QID) | ORAL | Status: DC | PRN

## 2011-02-07 NOTE — Telephone Encounter (Signed)
Hydrocodone refill request #120, last filled 01/07/11

## 2011-02-07 NOTE — Telephone Encounter (Signed)
Refill OK for 3 months (refill with 2 additional refills)

## 2011-02-07 NOTE — Telephone Encounter (Signed)
Completed.

## 2011-02-07 NOTE — Telephone Encounter (Signed)
Rx called in 

## 2011-02-11 ENCOUNTER — Ambulatory Visit (INDEPENDENT_AMBULATORY_CARE_PROVIDER_SITE_OTHER): Payer: Medicare Other | Admitting: Family Medicine

## 2011-02-11 ENCOUNTER — Encounter: Payer: Self-pay | Admitting: Family Medicine

## 2011-02-11 VITALS — BP 130/78 | Temp 98.0°F

## 2011-02-11 DIAGNOSIS — Z111 Encounter for screening for respiratory tuberculosis: Secondary | ICD-10-CM

## 2011-02-11 DIAGNOSIS — R590 Localized enlarged lymph nodes: Secondary | ICD-10-CM

## 2011-02-11 DIAGNOSIS — R599 Enlarged lymph nodes, unspecified: Secondary | ICD-10-CM

## 2011-02-11 NOTE — Progress Notes (Signed)
  Subjective:    Patient ID: Jo Alvarez, female    DOB: 12-17-1924, 77 y.o.   MRN: [id number]  HPI Patient is seen with a lymph node left side of neck noted about couple weeks ago. She denies any sore throat or skin rashes. No fever or chills. Good appetite. No swallowing difficulties. Prior tonsillectomy in childhood. She has not noted any other adenopathy.   Review of Systems  Constitutional: Negative for fever, chills, appetite change, fatigue and unexpected weight change.  HENT: Negative for sore throat.   Respiratory: Negative for cough and shortness of breath.   Cardiovascular: Negative for chest pain.       Objective:   Physical Exam  Constitutional: She appears well-developed and well-nourished. No distress.  Neck: Neck supple.       Patient has one solitary lymph node which is about 1/2 cm diameter which is rounded and easily mobile and nontender left anterior cervical triangle. No other adenopathy noted. No supraclavicular adenopathy. No axillary adenopathy.  Cardiovascular: Normal rate and regular rhythm.   Pulmonary/Chest: Breath sounds normal. No respiratory distress. She has no wheezes. She has no rales. She exhibits no tenderness.          Assessment & Plan:  Cervical adenopathy. Suspect benign. She has one solitary very small node left neck with no atypical features. Observe for now. Follow up if she has any new nodes or progressive enlargement.

## 2011-02-11 NOTE — Patient Instructions (Signed)
Follow up if you have any lymph node enlargement for if you are having any new nodes.

## 2011-02-19 ENCOUNTER — Telehealth: Payer: Self-pay | Admitting: Family Medicine

## 2011-02-19 NOTE — Telephone Encounter (Signed)
Nurse called from [employer]. She is requesting a diet order and clarification on meds for this patient, Jo Alvarez. Please call back.

## 2011-02-19 NOTE — Telephone Encounter (Signed)
Called dottie at [facility] to explain dr. [name] and nancy out of office - she has an imcomplete fl2 form and pt to be admitted Friday for short stay. I told her i would see if i could find the one dr. [name] stated was completed 02/07/11. She is faxing me a copy of what she has.   Tim Lair - can we find out if this was sent to be scanned?

## 2011-02-26 ENCOUNTER — Emergency Department (HOSPITAL_COMMUNITY)
Admission: EM | Admit: 2011-02-26 | Discharge: 2011-02-26 | Disposition: A | Discharge status: Home / Self Care | Payer: Medicare Other | Attending: Emergency Medicine | Admitting: Emergency Medicine

## 2011-02-26 DIAGNOSIS — I10 Essential (primary) hypertension: Secondary | ICD-10-CM | POA: Insufficient documentation

## 2011-02-26 DIAGNOSIS — G589 Mononeuropathy, unspecified: Secondary | ICD-10-CM | POA: Insufficient documentation

## 2011-02-26 DIAGNOSIS — Z008 Encounter for other general examination: Secondary | ICD-10-CM | POA: Insufficient documentation

## 2011-02-26 DIAGNOSIS — Y9210 Unspecified residential institution as the place of occurrence of the external cause: Secondary | ICD-10-CM | POA: Insufficient documentation

## 2011-02-26 DIAGNOSIS — W06XXXA Fall from bed, initial encounter: Secondary | ICD-10-CM | POA: Insufficient documentation

## 2011-03-04 ENCOUNTER — Ambulatory Visit (INDEPENDENT_AMBULATORY_CARE_PROVIDER_SITE_OTHER): Payer: Medicare Other | Admitting: Family Medicine

## 2011-03-04 VITALS — BP 130/70 | HR 68 | Temp 98.6°F | Resp 12

## 2011-03-04 DIAGNOSIS — R6251 Failure to thrive (child): Secondary | ICD-10-CM

## 2011-03-04 DIAGNOSIS — R627 Adult failure to thrive: Secondary | ICD-10-CM

## 2011-03-04 DIAGNOSIS — F339 Major depressive disorder, recurrent, unspecified: Secondary | ICD-10-CM

## 2011-03-04 DIAGNOSIS — R300 Dysuria: Secondary | ICD-10-CM

## 2011-03-04 LAB — POCT URINALYSIS DIPSTICK
Leukocytes, UA: NEGATIVE
Nitrite, UA: NEGATIVE
Protein, UA: NEGATIVE
Urobilinogen, UA: 0.2
pH, UA: 7.5

## 2011-03-04 MED ORDER — CIPROFLOXACIN HCL 250 MG PO TABS: 250.0000 mg | ORAL_TABLET | Freq: Two times a day (BID) | ORAL | Status: AC

## 2011-03-04 NOTE — Progress Notes (Signed)
  Subjective:    Patient ID: Jo Alvarez, female    DOB: 1925/07/10, 77 y.o.   MRN: [id number]  HPI Patient seen for the following issues.  Several day history of severe frequency and possibly some mild burning. History of frequent UTIs in past. Denies any fever, chills, nausea, vomiting, or any flank pain.  Family has some concerns about her mood. History of depression. Currently sertraline 50 mg daily. Family thinks that she may have some ongoing depression with frequent complaints. Patient does not feel her mood is worse in any way. No suicidal thoughts.  Patient family requesting hospice referral for failure to thrive. She has become progressively less active physically. Her weight and appetite have been relatively stable. She is becoming more dependent. They have concerns about leaving her at any point during the day because of her need for constant assistance. She is not having dementia or confusion issues. Her chronic problems include history of recurrent depression, hypertension, temporal arteritis, recurrent UTI, degenerative arthritis, chronic low back pain.  She has hx of frequent falls.   Review of Systems  Constitutional: Positive for fatigue. Negative for fever and chills.  HENT: Negative for sore throat and trouble swallowing.   Respiratory: Negative for cough and shortness of breath.   Genitourinary: Positive for dysuria, urgency and frequency. Negative for flank pain, decreased urine volume and vaginal discharge.  Musculoskeletal: Negative for back pain.  Neurological: Positive for weakness. Negative for dizziness, seizures, syncope and headaches.  Hematological: Negative for adenopathy. Does not bruise/bleed easily.  Psychiatric/Behavioral: Positive for dysphoric mood.       Objective:   Physical Exam  Constitutional: She is oriented to person, place, and time. She appears well-developed and well-nourished. No distress.  HENT:  Right Ear: External ear normal.  Left  Ear: External ear normal.  Mouth/Throat: Oropharynx is clear and moist.  Neck: Neck supple.  Cardiovascular: Normal rate and regular rhythm.   Pulmonary/Chest: Effort normal and breath sounds normal. No respiratory distress. She has no wheezes. She has no rales.  Musculoskeletal: She exhibits no edema.  Lymphadenopathy:    She has no cervical adenopathy.  Neurological: She is alert and oriented to person, place, and time. No cranial nerve deficit.       She is generally weak but no focal weakness  Psychiatric: She has a normal mood and affect. Her behavior is normal. Judgment and thought content normal.          Assessment & Plan:   #1 dysuria. Urine here appears unremarkable. No clear indication for antibiotic but start Cipro if she develops any fever or worsening symptoms.  ?atrophic vaginitis. #2 history of depression. Not clear she is having any exacerbation. Observe for now.  #3 failure to thrive. Family requesting hospice referral. We'll proceed

## 2011-03-04 NOTE — Patient Instructions (Signed)
Urinary Tract Infection (UTI) Infections of the urinary tract can start in several places. A bladder infection (cystitis), a kidney infection (pyelonephritis), and a prostate infection (prostatitis) are different types of urinary tract infections. They usually get better if treated with medicines (antibiotics) that kill germs. Take all the medicine until it is gone. You or your child may feel better in a few days, but TAKE ALL MEDICINE or the infection may not respond and may become more difficult to treat. HOME CARE INSTRUCTIONS  Drink enough water and fluids to keep the urine clear or pale yellow. Cranberry juice is especially recommended, in addition to large amounts of water.   Avoid caffeine, tea, and carbonated beverages. They tend to irritate the bladder.   Alcohol may irritate the prostate.   Only take over-the-counter or prescription medicines for pain, discomfort, or fever as directed by your caregiver.  FINDING OUT THE RESULTS OF YOUR TEST Not all test results are available during your visit. If your or your child's test results are not back during the visit, make an appointment with your caregiver to find out the results. Do not assume everything is normal if you have not heard from your caregiver or the medical facility. It is important for you to follow up on all test results. TO PREVENT FURTHER INFECTIONS:  Empty the bladder often. Avoid holding urine for long periods of time.   After a bowel movement, women should cleanse from front to back. Use each tissue only once.   Empty the bladder before and after sexual intercourse.  SEEK MEDICAL CARE IF:  There is back pain.   You or your child has an oral temperature above 100.   Your baby is older than 3 months with a rectal temperature of 100.5 F (38.1 C) or higher for more than 1 day.   Your or your child's problems (symptoms) are no better in 3 days. Return sooner if you or your child is getting worse.  SEEK IMMEDIATE  MEDICAL CARE IF:  There is severe back pain or lower abdominal pain.   You or your child develops chills.   You or your child has an oral temperature above 100, not controlled by medicine.   Your baby is older than 3 months with a rectal temperature of 102 F (38.9 C) or higher.   Your baby is 39 months old or younger with a rectal temperature of 100.4 F (38 C) or higher.   There is nausea or vomiting.   There is continued burning or discomfort with urination.  MAKE SURE YOU:  Understand these instructions.   Will watch this condition.   Will get help right away if you or your child is not doing well or gets worse.  Document Released: 05/28/2005 Document Re-Released: 11/12/2009 [facility] Patient Information 2011 [place], [place].

## 2011-03-06 ENCOUNTER — Ambulatory Visit (INDEPENDENT_AMBULATORY_CARE_PROVIDER_SITE_OTHER): Payer: Medicare Other | Admitting: Family Medicine

## 2011-03-06 ENCOUNTER — Encounter: Payer: Self-pay | Admitting: Family Medicine

## 2011-03-06 DIAGNOSIS — F339 Major depressive disorder, recurrent, unspecified: Secondary | ICD-10-CM

## 2011-03-06 DIAGNOSIS — R5381 Other malaise: Secondary | ICD-10-CM

## 2011-03-06 DIAGNOSIS — R627 Adult failure to thrive: Secondary | ICD-10-CM

## 2011-03-06 DIAGNOSIS — M1990 Unspecified osteoarthritis, unspecified site: Secondary | ICD-10-CM

## 2011-03-06 NOTE — Progress Notes (Signed)
  Subjective:    Patient ID: Jo Alvarez, female    DOB: Jan 31, 1925, 77 y.o.   MRN: [id number]  HPI Consultation with patient's son and daughter-in-law. Patient lives with son. Multiple chronic problems as previously outlined including frequent falls, osteoarthritis especially involving back, recurrent depression, temporal arteritis, hypertension, and chronic intermittent diarrhea. Son has power of attorney and brings in copy of appropriate papers today.  Concerns over progressive depression. She is very low motivation and distally and then about 95% of the time. Question decreased appetite. Progressive generalized weakness and failure to thrive. Hospice consult has been initiated. Increasingly difficult time with transfers. Question of some recent delusions. No recent head injury. Son and daughter-in-law concerned that she has depression but is not fully treated. Currently on sertraline 50 mg daily and compliant with therapy. She's been on diazepam for several years and also takes hydrocodone for pain relief. Still seems to be in pain at times. She has chronic neuropathy pain treated with low-dose Neurontin. She is supplemented with Ultram for pain relief. No recent fever.  No reports of any suicidal ideation. No alcohol use   Review of Systems As above, per family    Objective:   Physical Exam This is a consultation visit with family and patient not present       Assessment & Plan:  #1 failure to thrive. Question of recent cognitive decline and she has long history of depression. She has chronic pain issues and recent poor appetite. Progressive weakness. Has had extensive physical therapy in the past. Hospice referral has been made #2 recurrent/chronic depression. Discussed titration sertraline to 75 mg daily and reassess 2-3 weeks. They've tried to get her out and engaged in activities that she's been reluctant  30 minutes spent with the patient's family discussing long-term care issues.  They're looking at getting 24-hour assistance and possible eventual long-term placement

## 2011-03-06 NOTE — Patient Instructions (Signed)
Increase Sertraline to 50 mg one and one half daily.

## 2011-03-11 ENCOUNTER — Ambulatory Visit: Payer: Medicare Other | Admitting: Family Medicine

## 2011-03-11 DIAGNOSIS — Z299 Encounter for prophylactic measures, unspecified: Secondary | ICD-10-CM

## 2011-03-11 DIAGNOSIS — Z111 Encounter for screening for respiratory tuberculosis: Secondary | ICD-10-CM

## 2011-03-11 MED ORDER — TUBERCULIN PPD 5 UNIT/0.1ML ID SOLN: 5.0000 [IU] | Freq: Once | INTRADERMAL | Status: DC

## 2011-03-13 ENCOUNTER — Encounter: Payer: Self-pay | Admitting: Family Medicine

## 2011-03-14 ENCOUNTER — Telehealth: Payer: Self-pay | Admitting: *Deleted

## 2011-03-14 MED ORDER — PROMETHAZINE HCL 25 MG PO TABS: 20.0000 mg | ORAL_TABLET | Freq: Four times a day (QID) | ORAL | Status: DC | PRN

## 2011-03-14 NOTE — Telephone Encounter (Signed)
PPD read negative, documentation provided to son.  Pt vomited in the car the other day, C/O nausea at times when in a vehicle.  Requesting an anti nausea med for as needed basis.

## 2011-03-14 NOTE — Telephone Encounter (Signed)
Promethazine 25 mg po q 6 hours prn, disp #20 with no refill.

## 2011-03-21 ENCOUNTER — Ambulatory Visit: Payer: Medicare Other | Admitting: Family Medicine

## 2011-03-26 ENCOUNTER — Ambulatory Visit (INDEPENDENT_AMBULATORY_CARE_PROVIDER_SITE_OTHER): Payer: Medicare Other | Admitting: Family Medicine

## 2011-03-26 ENCOUNTER — Encounter: Payer: Self-pay | Admitting: Family Medicine

## 2011-03-26 VITALS — BP 120/80 | Temp 98.4°F | Wt 128.0 lb

## 2011-03-26 DIAGNOSIS — L8990 Pressure ulcer of unspecified site, unspecified stage: Secondary | ICD-10-CM

## 2011-03-26 DIAGNOSIS — L8991 Pressure ulcer of unspecified site, stage 1: Secondary | ICD-10-CM

## 2011-03-26 NOTE — Progress Notes (Signed)
  Subjective:    Patient ID: Jo Alvarez, female    DOB: 19-Aug-1925, 77 y.o.   MRN: [id number]  HPI Seen for possible pressure sore sacral area. Recent nursing aide help. History of previous pressure sore. Very im mobile. Very little ambulation. Also history of some incontinence. No recent breakthrough and skin. Does not shift positions very often. Also has history of some lower neuropathy but no neuropathy involving area of concern.   Review of Systems  Constitutional: Positive for fatigue. Negative for fever, chills and appetite change.  Respiratory: Negative for cough.   Genitourinary: Positive for urgency. Negative for dysuria.       Objective:   Physical Exam  Constitutional: She appears well-developed and well-nourished.  Cardiovascular: Normal rate and regular rhythm.   Pulmonary/Chest: Effort normal and breath sounds normal. No respiratory distress. She has no wheezes. She has no rales.  Musculoskeletal: She exhibits no edema.  Skin:       She has a mild erythema sacral area but no ulceration.          Assessment & Plan:  Probable early pressure sore sacrum. No epidermal breakdown. Mild erythema. Discussed control of moisture, frequent shifting of positions, and moisture repellent such as Desitin. Prompt followup for any breakthrough and scan and nursing aide will be checking several times weekly

## 2011-03-26 NOTE — Patient Instructions (Signed)
Keep area as dry as possible. Consider moisture repellent such as Desitin Try to shift positions frequently Notify us immediately for any skin breakdown.

## 2011-03-31 ENCOUNTER — Telehealth: Payer: Self-pay | Admitting: *Deleted

## 2011-03-31 NOTE — Telephone Encounter (Signed)
Her insurance was not covering this med and we had recommended trying to keep her off with increased risk of falls with benzodiazepines

## 2011-03-31 NOTE — Telephone Encounter (Signed)
D/C med from active med list and denied refill

## 2011-03-31 NOTE — Telephone Encounter (Signed)
I spoke with pt to inform her of Dr Lucie Leather concerns.  She was not aware the insurance was not covering this med, she explained she has someone with her 8-9 hours a day lately and son and daughter after hours to assist her as needed.  She uses the Diazepam for rib pain, "it helps me  relax so I can get some sleep".  She also states she is not taking the Hydrocodone med very often as the diazepam seems to help more.

## 2011-03-31 NOTE — Telephone Encounter (Signed)
Faxed refill request for Diazepam 5 mg, take 1 tab po every day prn anxiety. Last written 08/30/2010 #30 with 2 refills [facility]

## 2011-04-12 ENCOUNTER — Other Ambulatory Visit: Payer: Self-pay | Admitting: Family Medicine

## 2011-04-18 ENCOUNTER — Emergency Department (HOSPITAL_COMMUNITY): Payer: Medicare Other

## 2011-04-18 ENCOUNTER — Inpatient Hospital Stay (HOSPITAL_COMMUNITY)
Admission: EM | Admit: 2011-04-18 | Discharge: 2011-04-21 | DRG: 689 | Disposition: A | Discharge status: Home Health | Payer: Medicare Other | Attending: Internal Medicine | Admitting: Internal Medicine

## 2011-04-18 DIAGNOSIS — Z96649 Presence of unspecified artificial hip joint: Secondary | ICD-10-CM

## 2011-04-18 DIAGNOSIS — IMO0002 Reserved for concepts with insufficient information to code with codable children: Secondary | ICD-10-CM

## 2011-04-18 DIAGNOSIS — E871 Hypo-osmolality and hyponatremia: Secondary | ICD-10-CM | POA: Diagnosis present

## 2011-04-18 DIAGNOSIS — L4050 Arthropathic psoriasis, unspecified: Secondary | ICD-10-CM | POA: Diagnosis present

## 2011-04-18 DIAGNOSIS — Z880 Allergy status to penicillin: Secondary | ICD-10-CM

## 2011-04-18 DIAGNOSIS — R7881 Bacteremia: Secondary | ICD-10-CM | POA: Diagnosis present

## 2011-04-18 DIAGNOSIS — J189 Pneumonia, unspecified organism: Secondary | ICD-10-CM | POA: Diagnosis present

## 2011-04-18 DIAGNOSIS — Z66 Do not resuscitate: Secondary | ICD-10-CM | POA: Diagnosis present

## 2011-04-18 DIAGNOSIS — Z96659 Presence of unspecified artificial knee joint: Secondary | ICD-10-CM

## 2011-04-18 DIAGNOSIS — I10 Essential (primary) hypertension: Secondary | ICD-10-CM | POA: Diagnosis present

## 2011-04-18 DIAGNOSIS — H5440 Blindness, one eye, unspecified eye: Secondary | ICD-10-CM | POA: Diagnosis present

## 2011-04-18 DIAGNOSIS — I059 Rheumatic mitral valve disease, unspecified: Secondary | ICD-10-CM | POA: Diagnosis present

## 2011-04-18 DIAGNOSIS — Z87440 Personal history of urinary (tract) infections: Secondary | ICD-10-CM

## 2011-04-18 DIAGNOSIS — K5900 Constipation, unspecified: Secondary | ICD-10-CM | POA: Diagnosis present

## 2011-04-18 DIAGNOSIS — T4275XA Adverse effect of unspecified antiepileptic and sedative-hypnotic drugs, initial encounter: Secondary | ICD-10-CM | POA: Diagnosis present

## 2011-04-18 DIAGNOSIS — M1990 Unspecified osteoarthritis, unspecified site: Secondary | ICD-10-CM | POA: Diagnosis present

## 2011-04-18 DIAGNOSIS — Z79899 Other long term (current) drug therapy: Secondary | ICD-10-CM

## 2011-04-18 DIAGNOSIS — D72829 Elevated white blood cell count, unspecified: Secondary | ICD-10-CM | POA: Diagnosis present

## 2011-04-18 DIAGNOSIS — E876 Hypokalemia: Secondary | ICD-10-CM | POA: Diagnosis present

## 2011-04-18 DIAGNOSIS — N12 Tubulo-interstitial nephritis, not specified as acute or chronic: Principal | ICD-10-CM | POA: Diagnosis present

## 2011-04-18 DIAGNOSIS — M316 Other giant cell arteritis: Secondary | ICD-10-CM | POA: Diagnosis present

## 2011-04-18 DIAGNOSIS — M216X9 Other acquired deformities of unspecified foot: Secondary | ICD-10-CM | POA: Diagnosis present

## 2011-04-18 DIAGNOSIS — L89109 Pressure ulcer of unspecified part of back, unspecified stage: Secondary | ICD-10-CM | POA: Diagnosis present

## 2011-04-18 DIAGNOSIS — Z993 Dependence on wheelchair: Secondary | ICD-10-CM

## 2011-04-18 DIAGNOSIS — G8929 Other chronic pain: Secondary | ICD-10-CM | POA: Diagnosis present

## 2011-04-18 DIAGNOSIS — L8991 Pressure ulcer of unspecified site, stage 1: Secondary | ICD-10-CM | POA: Diagnosis present

## 2011-04-18 DIAGNOSIS — R079 Chest pain, unspecified: Secondary | ICD-10-CM | POA: Diagnosis present

## 2011-04-18 DIAGNOSIS — G609 Hereditary and idiopathic neuropathy, unspecified: Secondary | ICD-10-CM | POA: Diagnosis present

## 2011-04-18 DIAGNOSIS — A498 Other bacterial infections of unspecified site: Secondary | ICD-10-CM | POA: Diagnosis present

## 2011-04-18 DIAGNOSIS — I4891 Unspecified atrial fibrillation: Secondary | ICD-10-CM | POA: Diagnosis present

## 2011-04-18 LAB — CBC
MCV: 90.9 fL (ref 78.0–100.0)
Platelets: 255 10*3/uL (ref 150–400)
RBC: 3.52 MIL/uL — ABNORMAL LOW (ref 3.87–5.11)
RDW: 13.5 % (ref 11.5–15.5)
WBC: 19.5 10*3/uL — ABNORMAL HIGH (ref 4.0–10.5)

## 2011-04-18 LAB — URINALYSIS, ROUTINE W REFLEX MICROSCOPIC
Bilirubin Urine: NEGATIVE
Glucose, UA: NEGATIVE mg/dL
Ketones, ur: NEGATIVE mg/dL
pH: 6 (ref 5.0–8.0)

## 2011-04-18 LAB — DIFFERENTIAL
Basophils Relative: 0 % (ref 0–1)
Eosinophils Absolute: 0 10*3/uL (ref 0.0–0.7)
Eosinophils Relative: 0 % (ref 0–5)
Neutrophils Relative %: 90 % — ABNORMAL HIGH (ref 43–77)

## 2011-04-18 LAB — URINE MICROSCOPIC-ADD ON

## 2011-04-18 LAB — HEPATIC FUNCTION PANEL
ALT: 12 U/L (ref 0–35)
AST: 13 U/L (ref 0–37)
Albumin: 3.5 g/dL (ref 3.5–5.2)
Total Protein: 6.4 g/dL (ref 6.0–8.3)

## 2011-04-18 LAB — BASIC METABOLIC PANEL
BUN: 20 mg/dL (ref 6–23)
CO2: 31 mEq/L (ref 19–32)
Chloride: 88 mEq/L — ABNORMAL LOW (ref 96–112)
Creatinine, Ser: 0.7 mg/dL (ref 0.50–1.10)
Potassium: 3.2 mEq/L — ABNORMAL LOW (ref 3.5–5.1)

## 2011-04-19 LAB — BASIC METABOLIC PANEL
CO2: 30 mEq/L (ref 19–32)
Calcium: 8.3 mg/dL — ABNORMAL LOW (ref 8.4–10.5)
Chloride: 92 mEq/L — ABNORMAL LOW (ref 96–112)
Glucose, Bld: 111 mg/dL — ABNORMAL HIGH (ref 70–99)
Sodium: 129 mEq/L — ABNORMAL LOW (ref 135–145)

## 2011-04-19 LAB — MAGNESIUM: Magnesium: 1.7 mg/dL (ref 1.5–2.5)

## 2011-04-19 LAB — CBC
Hemoglobin: 9.7 g/dL — ABNORMAL LOW (ref 12.0–15.0)
MCH: 31.4 pg (ref 26.0–34.0)
MCV: 91.6 fL (ref 78.0–100.0)
Platelets: 221 10*3/uL (ref 150–400)
RBC: 3.09 MIL/uL — ABNORMAL LOW (ref 3.87–5.11)
WBC: 16.1 10*3/uL — ABNORMAL HIGH (ref 4.0–10.5)

## 2011-04-19 NOTE — H&P (Signed)
Jo Alvarez, Jo Alvarez               ACCOUNT NO.:  [id number]  MEDICAL RECORD NO.:  [id number]  LOCATION:  [facility]LED                         FACILITY:  [facility]  PHYSICIAN:  Gery Pray, MD      DATE OF BIRTH:  03-15-1925  DATE OF ADMISSION:  04/18/2011 DATE OF DISCHARGE:                             HISTORY & PHYSICAL   PRIMARY CARE PHYSICIAN:  Evelena Peat, MD, with [facility].  CODE STATUS:  DNR code.  Team 3.  CHIEF COMPLAINT:  Nausea, vomiting.  HISTORY OF PRESENT ILLNESS:  This is a rather pleasant 77 year old female who lives at home with her family.  For the last few days, she has been getting chills on and off.  She has also been having some intermittent nausea on and off for the last few weeks, however, today it became quite marked.  She was vomiting nonstop, no hematemesis.  She reports burning urination.  She reports some difficulty with urinating, however, this is chronic and ongoing.  She does report constipation. She is on chronic pain maintenance.  Today, she also developed a fever as high as 103 and at that point the family thought she was again developing a UTI and brought her into the ER.  She does complain of some chest pain, but this is chronic rib pain that she has had for years. She did have some mild confusion.  She has also been getting weaker over the past few weeks.  History obtained from the patient as well as her son who is present at the bedside, both appear reliable.  PAST MEDICAL HISTORY: 1. Recurrent UTI. 2. Hypertension. 3. Temporal arteritis, on chronic prednisone. 4. 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 5. Paroxysmal AFib, not on anticoagulation. 6. Psoriatic arthritis. 7. Peripheral neuropathy with bilateral footdrop. 8. Mitral valve prolapse. 9. Osteoarthritis.  PAST SURGICAL HISTORY:  Cholecystectomy, lumbar laminectomy, hysterectomy, total right knee replacement 3 times, left total hip replacement, skin cancer,  possible surgery for mitral valve prolapse.  MEDICATIONS: 1. Norvasc 10 mg daily. 2. Fioricet q.6 h. p.r.n. headache. 3. Calcium with D. 4. Tagamet 200 mg twice daily. 5. Colace daily. 6. Folic acid 1 mg twice daily. 7. Neurontin 100 mg 3 times daily. 8. Hydrocodone every 6 hours. 9. Arava 10 mg one-half tablets daily. 10.Lisinopril/HCTZ 20/12.5 daily. 11.Imodium 2 mg as needed. 12.Multivitamins daily. 13.PreserVision 2 tablets daily. 14.Omeprazole 20 mg daily. 15.MiraLax daily. 16.Prednisone 5 mg daily. 17.Zoloft 50 mg daily.  ALLERGIES:  The patient is allergic to, 1. PENICILLIN. 2. EPINEPHRINE. 3. LIDOCAINE. 4. LOTENSIN.  SOCIAL HISTORY:  The patient lives with her family.  She is wheelchair bound.  She does use oxygen.  No smoking.  No drinking.  No illicit drugs.  She has a caretaker.  FAMILY HISTORY:  Significant for hypertension.  REVIEW OF SYSTEMS:  All 10-point systems reviewed are negative except as noted in the HPI.  PHYSICAL EXAMINATION:  VITAL SIGNS:  Blood pressure 157/66, pulse 104, respirations 20, temperature 103 orally, 102.1 rectally, sats 99% on room air. GENERAL:  Alert, oriented, pleasant female. EYES:  Pale conjunctivae.  PERRLA. ENT:  Moist oral mucosa.  Trachea midline. NECK:  Supple. LUNGS:  Clear to auscultation bilaterally.  No use of accessory muscles. CARDIOVASCULAR:  Regular rate and rhythm.  No murmurs appreciated.  No JVD. ABDOMEN:  Soft, positive bowel sounds, nontender, nondistended.  No organomegaly. NEURO:  Cranial nerves II through XII grossly intact.  Sensation is significantly decreased in bilateral lower extremities. MUSCULOSKELETAL:  Strength, the patient is globally weak, bilateral lower extremities is approximately 2.5/5, upper extremities 4/5.  The patient has bilateral footdrop.  The patient's right hand is almost permanently contracted.  No edema.  Decreased range of motion. SKIN:  The patient has a stage I sacral  decubitus.  No rash.  No subcutaneous crepitation. PSYCH:  Alert, oriented, appropriate female.  LABORATORY DATA:  Lactic acid 0.7.  White blood count 19.3, hemoglobin 11, platelets 255.  UA is nitrite positive with moderate leukocyte esterase, cloudy in color, 11-20 white blood cells, many bacteria. Lipase 33.  Sodium 137, potassium 3.2, chloride 88, CO2 of 31, glucose 132, BUN 20.  LFTs are normal.  EKG, normal sinus rhythm.  ASSESSMENT AND PLAN: 1. Sepsis due to urinary tract infection.  The patient will be     admitted, urine and blood cultures have already been collected.  We     will continue patient's Cipro 200 mg IV q.12. 2. Hyponatremia. 3. Hypokalemia, possibly due to the hydrochlorothiazide, per     son, the patient does tend to have chronic issue with this.  We     will go ahead and discontinue the hydrochlorothiazide, check urine     sodium and osmolarity.  We will also check a plasma osmolarity and     TSH.  We will replete electrolytes and IV fluids for now. 4. Hypertension. 5. Temporal arteritis. 6. Paroxysmal atrial fibrillation. 7. Psoriatic arthritis . 8. Peripheral neuropathy. 9. Mitral valve prolapse.  Resume the patient's home medication as     stated the patient is on chronic narcotics. 10.Constipation due to the patient's chronic narcotics.  We will start     a bowel regimen.          ______________________________ Gery Pray, MD     DC/MEDQ  D:  04/18/2011  T:  04/19/2011  Job:  [id number]  Electronically Signed by Gery Pray MD on 04/19/2011 03:24:37 AM

## 2011-04-20 LAB — BASIC METABOLIC PANEL
CO2: 27 mEq/L (ref 19–32)
Chloride: 98 mEq/L (ref 96–112)
Glucose, Bld: 101 mg/dL — ABNORMAL HIGH (ref 70–99)
Potassium: 5.1 mEq/L (ref 3.5–5.1)
Sodium: 129 mEq/L — ABNORMAL LOW (ref 135–145)

## 2011-04-20 LAB — OSMOLALITY: Osmolality: 268 mOsm/kg — ABNORMAL LOW (ref 275–300)

## 2011-04-21 LAB — URINE CULTURE
Colony Count: >=100000
Culture  Setup Time: 201208180143

## 2011-04-21 LAB — CBC
HCT: 29.3 % — ABNORMAL LOW (ref 36.0–46.0)
Hemoglobin: 9.8 g/dL — ABNORMAL LOW (ref 12.0–15.0)
MCH: 30.8 pg (ref 26.0–34.0)
MCHC: 33.4 g/dL (ref 30.0–36.0)
MCV: 92.1 fL (ref 78.0–100.0)
RBC: 3.18 MIL/uL — ABNORMAL LOW (ref 3.87–5.11)

## 2011-04-21 LAB — CULTURE, BLOOD (ROUTINE X 2): Culture  Setup Time: 201208180158

## 2011-04-23 ENCOUNTER — Telehealth: Payer: Self-pay | Admitting: *Deleted

## 2011-04-23 NOTE — Telephone Encounter (Signed)
Note from [facility], med management note, removed Docusate and Omeprazole

## 2011-04-24 ENCOUNTER — Telehealth: Payer: Self-pay | Admitting: *Deleted

## 2011-04-24 MED ORDER — NYSTATIN 100000 UNIT/ML MT SUSP: 500000.0000 [IU] | Freq: Four times a day (QID) | OROMUCOSAL | Status: AC

## 2011-04-24 NOTE — Telephone Encounter (Signed)
Raynelle Fanning from [facility] states that pt told her that she feels like she is having thrush and has blisters in her throat. Raynelle Fanning couldn't see any blisters in her throat but did see some bumps on her tongue. She would like for pt to have some Nystatin Sup called in to [facility]. Please call pt's home to let them know if a rx has been called in.

## 2011-04-24 NOTE — Telephone Encounter (Signed)
Pt son informed. 

## 2011-04-24 NOTE — Telephone Encounter (Signed)
Certainly not common.  Make sure they are monitoring her temp to make sure no fever.

## 2011-04-24 NOTE — Telephone Encounter (Signed)
Received PC from pt son [name] asking for Dr Lucie Leather opinion re mothers condition.  Pt was in hospital 3 days, diag. UTI.  She has been put on Levaquin, she has 13 pills left.  Pt is acting combative, irratic and C/O feeling hot.  Son questioning if this could be a side effect of the Levaquin?  Pt came home on Monday.

## 2011-04-24 NOTE — Telephone Encounter (Signed)
Okay to call in nystatin suspension 1 teaspoon each side of mouth swish and spit 3-4 times daily, dispense 240 mL's

## 2011-04-29 ENCOUNTER — Telehealth: Payer: Self-pay | Admitting: *Deleted

## 2011-04-29 NOTE — Telephone Encounter (Signed)
Family would like an order for [facility] to come in twice weekly for bath and personal care.  Please call with order.

## 2011-04-29 NOTE — Telephone Encounter (Signed)
OK to initiate order requested for Home Health.

## 2011-04-30 NOTE — Telephone Encounter (Signed)
Verbal order given on Jo Alvarez's personally identified VM.  If she needs a written order, requested a call back and will initiate

## 2011-05-02 ENCOUNTER — Other Ambulatory Visit: Payer: Self-pay | Admitting: *Deleted

## 2011-05-02 MED ORDER — SERTRALINE HCL 50 MG PO TABS: ORAL_TABLET | ORAL | Status: DC

## 2011-05-02 NOTE — Discharge Summary (Signed)
Jo, Alvarez               ACCOUNT NO.:  [id number]  MEDICAL RECORD NO.:  [id number]  LOCATION:  1540                         FACILITY:  [facility]  PHYSICIAN:  Marinda Elk, M.D.DATE OF BIRTH:  [birth year]  DATE OF ADMISSION:  04/18/2011 DATE OF DISCHARGE:  04/21/2011                              DISCHARGE SUMMARY   PRIMARY CARE DOCTOR:  Evelena Peat, M.D., [place].  DISCHARGE DIAGNOSES: 1. Pneumonia 2. E coli pyelonephritis. 3. E coli bacteremia. 4. Hyponatremia. 5. Hypokalemia. 6. Paroxysmal atrial fibrillation.  DISCHARGE MEDICATIONS: 1. Tylenol 650 mg q.4 h. p.r.n. 2. Levaquin 750 mg p.o. daily. 3. Amlodipine 10 mg p.o. daily. 4. Arava 1-1/2 tablets daily. 5. Artificial tears 1 drop OP b.i.d. 6. Beta-carotene tablets 2 tablets b.i.d. 7. Calcium carbonate 500/200 mg 1 tablet b.i.d. 8. Docusate 100 mg daily. 9. Folic acid 1 mg daily. 10.Famotidine 20 mg p.o. b.i.d. 11.Hydrocodone/APAP 10/325 mg 1 tablet p.o. q.6 h. p.r.n. 12.Lisinopril/hydrochlorothiazide 20/12.5 mg p.o. daily. 13.Loperamide 2 mg p.o. daily. 14.Multivitamin 1 tablet daily. 15.Neurontin 100 mg 1-2 tablets t.i.d. 16.Omeprazole 20 mg daily. 17.Prednisone 5 mg daily. 18.PreserVision 2 caps b.i.d. 19.Senokot 2 tablets at bedtime. 20.Sertraline 50 mg tablet daily. 21.Systane 1 drop in both eyes b.i.d.  PROCEDURES PERFORMED:  Chest x-ray showed left lower lobe pneumonia.  BRIEF ADMITTING HISTORY AND PHYSICAL:  This is an 77 year old female who lives at home with past medical history of paroxysmal AFib who comes for chills, on and off for the last few days.  She has also been having intermittent nausea on and off for the last week.  However, today she has been quite marked.  She was vomiting nonstop, she reports burning with urination, she reports some difficulty with urinating, however, that this is chronic and ongoing.  She does report some constipation. She has chronic pain and  weakness.  Today, she developed fever 103 and the family thought she was developing a UTI, so she was brought here to the ED.  She does complain of some rib pain, but she has had that for years.  She has also had some mild confusion.  BRIEF ADMITTING PHYSICAL EXAMINATION:  VITAL SIGNS:  Temperature 157/66, pulse of 104, respirations of 20, temperature 103, saturating 99% on room air. GENERAL:  Awake, alert, oriented, pleasant female. HEENT:  Pale conjunctivae.  PERRLA.  ENT, moist mucous membrane, midline trachea. NECK:  Supple. LUNGS:  Good air movement.  Clear to auscultation. CARDIOVASCULAR:  Regular rate and rhythm with positive S1, S2.  No murmurs, rubs, or gallops.  No JVD. ABDOMEN:  Positive bowel sounds.  Nontender, nondistended, soft. EXTREMITIES:  Positive pulses.  No clubbing, cyanosis, or edema. NEURO:  Nonfocal. MUSCULOSKELETAL:  5/5 in all 4 extremities.  Muscle strength is good. No joint deformities. SKIN:  No rashes. She has a stage I decubitus ulcer. PSYCH:  Alert and oriented and appropriate.  LABORATORY DATA ON ADMISSION:  Her lactic acid 0.7.  White count of 19, hemoglobin of 11, platelet count 255.  UA was positive for nitrites and moderate leukocyte esterase, 11 to 20 white blood cells, many bacteria. Lipase of 33.  Sodium 137, potassium 3.2, chloride 88, bicarb of 31, glucose of  132, BUN of 30.  LFTs within normal limits.  EKG shows sinus rhythm and imaging as above.  ASSESSMENT/PLAN: 1. E coli pyelonephritis.  Positive costovertebral angle tenderness on     physical exam.  She was started on Cipro on admission, this was     changed to Rocephin.  Urine cultures came back E coli sensitive to     quinolones.  She was changed to Levaquin, which she was doing fine.     She was making urine well.  Her white count came down, she remained     afebrile, so she was discharged on 14 days of antibiotic. 2. Bacteremia.  Blood cultures were done that showed E coli  sensitive     to Levaquin.  She was changed to Levaquin.  She will continue this     for a total of 14 days.  She was discharged in stable condition.     Her nausea and vomiting had resolved. 3. Pneumonia.  Probably contributing to the factors, hard to exclude     it.  Also, she had a white count and an infiltrate on chest x-ray.     She has also been complaining of some cough occasionally.     Initially, she was started on Cipro, this was changed to Rocephin.     As she improved, she was changed to quinolone in order to cover     both lungs and urine.  Her urine cultures came back and blood     cultures that showed E coli sensitive to Levaquin, so she was     changed to Levaquin, she will continue this for a total of 14 days. 4. Paroxysmal atrial fibrillation, currently in sinus rhythm.  No     changes. 5. Hyponatremia that is probably secondary to pyelonephritis and     pneumonia.  This resolved with IV fluids. 6. Hypokalemia that is probably secondary to vomiting and dehydration.     We gave her IV fluids, repleted, the mag was checked.  The     magnesium level was checked as 1.7.  This resolved.  VITALS ON THE DAY OF DISCHARGE:  Temperature of 99, pulse 76, respiration of 16, blood pressure 150/76, she was saturating 93% on room air.  LABORATORY DATA ON THE DAY OF DISCHARGE:  Urine culture E coli sensitive to quinolones.  Blood cultures 2/2 positive for E coli.  Her white count was 11, hemoglobin of 9.8, and platelet count 248.  DISPOSITION:  The patient will follow up with her primary care doctor in 2 weeks.  He will see how she is doing.  We will check a blood pressure and titrate blood pressure medications as needed.  We will see if she has had any nausea.  She has not had any nausea after 3 days of antibiotics here in the hospital and would consider thinking of putting her in suppressive therapy for urinary tract infection.     Marinda Elk, M.D.     AF/MEDQ   D:  04/21/2011  T:  04/22/2011  Job:  [id number]  cc:   Evelena Peat, M.D.  Electronically Signed by Marinda Elk M.D. on 05/02/2011 04:54:04 PM

## 2011-05-12 ENCOUNTER — Telehealth: Payer: Self-pay | Admitting: *Deleted

## 2011-05-12 NOTE — Telephone Encounter (Signed)
Requesting an order for pt to have 1 additional nursing visit.  Please return call to Autumn w/ [facility] at [phone].

## 2011-05-12 NOTE — Telephone Encounter (Signed)
Please advise 

## 2011-05-12 NOTE — Telephone Encounter (Signed)
OK to add nursing visit.

## 2011-05-13 NOTE — Telephone Encounter (Signed)
Msg left on Autumns VM ok for additional nurse visit

## 2011-05-29 ENCOUNTER — Emergency Department (HOSPITAL_COMMUNITY): Payer: Medicare Other

## 2011-05-29 ENCOUNTER — Inpatient Hospital Stay (HOSPITAL_COMMUNITY)
Admission: EM | Admit: 2011-05-29 | Discharge: 2011-06-03 | DRG: 643 | Disposition: A | Discharge status: Skilled Nursing Facility | Payer: Medicare Other | Attending: Internal Medicine | Admitting: Internal Medicine

## 2011-05-29 DIAGNOSIS — L4050 Arthropathic psoriasis, unspecified: Secondary | ICD-10-CM | POA: Diagnosis present

## 2011-05-29 DIAGNOSIS — E876 Hypokalemia: Secondary | ICD-10-CM | POA: Diagnosis present

## 2011-05-29 DIAGNOSIS — I10 Essential (primary) hypertension: Secondary | ICD-10-CM | POA: Diagnosis present

## 2011-05-29 DIAGNOSIS — F3289 Other specified depressive episodes: Secondary | ICD-10-CM | POA: Diagnosis present

## 2011-05-29 DIAGNOSIS — Z993 Dependence on wheelchair: Secondary | ICD-10-CM

## 2011-05-29 DIAGNOSIS — Z96649 Presence of unspecified artificial hip joint: Secondary | ICD-10-CM

## 2011-05-29 DIAGNOSIS — G609 Hereditary and idiopathic neuropathy, unspecified: Secondary | ICD-10-CM | POA: Diagnosis present

## 2011-05-29 DIAGNOSIS — I4891 Unspecified atrial fibrillation: Secondary | ICD-10-CM | POA: Diagnosis present

## 2011-05-29 DIAGNOSIS — M216X9 Other acquired deformities of unspecified foot: Secondary | ICD-10-CM | POA: Diagnosis present

## 2011-05-29 DIAGNOSIS — E785 Hyperlipidemia, unspecified: Secondary | ICD-10-CM | POA: Diagnosis present

## 2011-05-29 DIAGNOSIS — E236 Other disorders of pituitary gland: Principal | ICD-10-CM | POA: Diagnosis present

## 2011-05-29 DIAGNOSIS — G9341 Metabolic encephalopathy: Secondary | ICD-10-CM | POA: Diagnosis present

## 2011-05-29 DIAGNOSIS — G894 Chronic pain syndrome: Secondary | ICD-10-CM | POA: Diagnosis present

## 2011-05-29 DIAGNOSIS — D638 Anemia in other chronic diseases classified elsewhere: Secondary | ICD-10-CM | POA: Diagnosis present

## 2011-05-29 DIAGNOSIS — F329 Major depressive disorder, single episode, unspecified: Secondary | ICD-10-CM | POA: Diagnosis present

## 2011-05-29 DIAGNOSIS — IMO0002 Reserved for concepts with insufficient information to code with codable children: Secondary | ICD-10-CM

## 2011-05-29 DIAGNOSIS — R627 Adult failure to thrive: Secondary | ICD-10-CM | POA: Diagnosis present

## 2011-05-29 DIAGNOSIS — R197 Diarrhea, unspecified: Secondary | ICD-10-CM | POA: Diagnosis present

## 2011-05-29 DIAGNOSIS — R5381 Other malaise: Secondary | ICD-10-CM | POA: Diagnosis present

## 2011-05-29 DIAGNOSIS — K859 Acute pancreatitis without necrosis or infection, unspecified: Secondary | ICD-10-CM | POA: Diagnosis present

## 2011-05-29 LAB — URINE MICROSCOPIC-ADD ON

## 2011-05-29 LAB — POCT I-STAT TROPONIN I: Troponin i, poc: 0.05 ng/mL (ref 0.00–0.08)

## 2011-05-29 LAB — URINALYSIS, ROUTINE W REFLEX MICROSCOPIC
Glucose, UA: NEGATIVE mg/dL
Hgb urine dipstick: NEGATIVE
Nitrite: NEGATIVE
pH: 6 (ref 5.0–8.0)

## 2011-05-29 LAB — COMPREHENSIVE METABOLIC PANEL
AST: 26 U/L (ref 0–37)
Albumin: 3.8 g/dL (ref 3.5–5.2)
BUN: 34 mg/dL — ABNORMAL HIGH (ref 6–23)
Calcium: 9.9 mg/dL (ref 8.4–10.5)
Creatinine, Ser: 0.8 mg/dL (ref 0.50–1.10)
Total Bilirubin: 0.4 mg/dL (ref 0.3–1.2)
Total Protein: 6.7 g/dL (ref 6.0–8.3)

## 2011-05-29 LAB — LIPASE, BLOOD: Lipase: 62 U/L — ABNORMAL HIGH (ref 11–59)

## 2011-05-29 LAB — DIFFERENTIAL
Basophils Absolute: 0 10*3/uL (ref 0.0–0.1)
Eosinophils Relative: 0 % (ref 0–5)
Lymphocytes Relative: 6 % — ABNORMAL LOW (ref 12–46)
Monocytes Absolute: 1.4 10*3/uL — ABNORMAL HIGH (ref 0.1–1.0)
Monocytes Relative: 9 % (ref 3–12)

## 2011-05-29 LAB — CBC
MCH: 31.4 pg (ref 26.0–34.0)
MCHC: 34.9 g/dL (ref 30.0–36.0)
Platelets: 373 10*3/uL (ref 150–400)

## 2011-05-30 LAB — DIFFERENTIAL
Eosinophils Absolute: 0 10*3/uL (ref 0.0–0.7)
Lymphocytes Relative: 10 % — ABNORMAL LOW (ref 12–46)
Lymphs Abs: 1.1 10*3/uL (ref 0.7–4.0)
Neutro Abs: 9 10*3/uL — ABNORMAL HIGH (ref 1.7–7.7)
Neutrophils Relative %: 79 % — ABNORMAL HIGH (ref 43–77)

## 2011-05-30 LAB — CBC
Hemoglobin: 11.3 g/dL — ABNORMAL LOW (ref 12.0–15.0)
MCV: 89.8 fL (ref 78.0–100.0)
Platelets: 315 10*3/uL (ref 150–400)
RBC: 3.53 MIL/uL — ABNORMAL LOW (ref 3.87–5.11)
WBC: 11.4 10*3/uL — ABNORMAL HIGH (ref 4.0–10.5)

## 2011-05-30 LAB — COMPREHENSIVE METABOLIC PANEL
ALT: 16 U/L (ref 0–35)
AST: 21 U/L (ref 0–37)
CO2: 29 mEq/L (ref 19–32)
Chloride: 88 mEq/L — ABNORMAL LOW (ref 96–112)
Creatinine, Ser: 1 mg/dL (ref 0.50–1.10)
GFR calc non Af Amer: 53 mL/min — ABNORMAL LOW (ref >60)
Glucose, Bld: 114 mg/dL — ABNORMAL HIGH (ref 70–99)
Total Bilirubin: 0.4 mg/dL (ref 0.3–1.2)

## 2011-05-30 LAB — CARDIAC PANEL(CRET KIN+CKTOT+MB+TROPI)
Relative Index: INVALID (ref 0.0–2.5)
Total CK: 67 U/L (ref 7–177)
Total CK: 67 U/L (ref 7–177)
Troponin I: <0.3 ng/mL (ref <0.30)

## 2011-05-30 LAB — MAGNESIUM: Magnesium: 1.8 mg/dL (ref 1.5–2.5)

## 2011-05-31 ENCOUNTER — Inpatient Hospital Stay (HOSPITAL_COMMUNITY): Payer: Medicare Other

## 2011-05-31 LAB — BASIC METABOLIC PANEL
BUN: 25 mg/dL — ABNORMAL HIGH (ref 6–23)
CO2: 25 mEq/L (ref 19–32)
Chloride: 100 mEq/L (ref 96–112)
Creatinine, Ser: 0.66 mg/dL (ref 0.50–1.10)

## 2011-05-31 LAB — CBC
HCT: 30.1 % — ABNORMAL LOW (ref 36.0–46.0)
MCV: 92.6 fL (ref 78.0–100.0)
RBC: 3.25 MIL/uL — ABNORMAL LOW (ref 3.87–5.11)
WBC: 10.6 10*3/uL — ABNORMAL HIGH (ref 4.0–10.5)

## 2011-06-01 LAB — COMPREHENSIVE METABOLIC PANEL
Albumin: 2.9 g/dL — ABNORMAL LOW (ref 3.5–5.2)
BUN: 11 mg/dL (ref 6–23)
Chloride: 104 mEq/L (ref 96–112)
Creatinine, Ser: 0.48 mg/dL — ABNORMAL LOW (ref 0.50–1.10)
GFR calc Af Amer: >60 mL/min (ref >60)
GFR calc non Af Amer: >60 mL/min (ref >60)
Total Bilirubin: 0.4 mg/dL (ref 0.3–1.2)

## 2011-06-01 LAB — CBC
HCT: 30 % — ABNORMAL LOW (ref 36.0–46.0)
MCV: 93.5 fL (ref 78.0–100.0)
RDW: 13.9 % (ref 11.5–15.5)
WBC: 6.5 10*3/uL (ref 4.0–10.5)

## 2011-06-01 LAB — LIPASE, BLOOD: Lipase: 34 U/L (ref 11–59)

## 2011-06-02 LAB — BASIC METABOLIC PANEL
CO2: 24 mEq/L (ref 19–32)
Calcium: 7.7 mg/dL — ABNORMAL LOW (ref 8.4–10.5)
GFR calc Af Amer: >90 mL/min (ref >90)
GFR calc non Af Amer: 86 mL/min — ABNORMAL LOW (ref >90)
Sodium: 133 mEq/L — ABNORMAL LOW (ref 135–145)

## 2011-06-03 LAB — BASIC METABOLIC PANEL
GFR calc Af Amer: 89 mL/min — ABNORMAL LOW (ref >90)
GFR calc non Af Amer: 77 mL/min — ABNORMAL LOW (ref >90)
Potassium: 4.4 mEq/L (ref 3.5–5.1)
Sodium: 133 mEq/L — ABNORMAL LOW (ref 135–145)

## 2011-06-04 NOTE — Discharge Summary (Signed)
Jo Alvarez, Jo Alvarez               ACCOUNT NO.:  [id number]  MEDICAL RECORD NO.:  [id number]  LOCATION:  1310                         FACILITY:  [facility]  PHYSICIAN:  Hillery Aldo, M.D.   DATE OF BIRTH:  [birth year]  DATE OF ADMISSION:  05/29/2011 DATE OF DISCHARGE:  06/03/2011                        DISCHARGE SUMMARY - REFERRING   PRIMARY CARE PHYSICIAN:  Jo Alvarez, M.D.  DISCHARGE DIAGNOSES: 1. Metabolic encephalopathy secondary to hyponatremia/syndrome of     inappropriate antidiuretic hormone. 2. Hypokalemia. 3. Transient loose stools in the setting of recent history of     Clostridium difficile colitis. 4. Acute pancreatitis. 5. Anemia of chronic disease. 6. Hypertension. 7. Depression. 8. Microvascular cerebrovascular disease. 9. Psoriatic arthritis. 10.Chronic pain syndrome. 11.Peripheral neuropathy. 12.History of temporal arteritis on chronic steroids. 13.Paroxysmal atrial fibrillation.  DISCHARGE MEDICATIONS:1. Lisinopril 40 mg p.o. daily. 2. Sertraline 75 mg p.o. daily. 3. Amlodipine 10 mg p.o. daily. 4. Artificial tears 1 drop in each eye b.i.d. p.r.n. dry eye. 5. Butalbital/acetaminophen/caffeine 1 tablet p.o. q.6 h. p.r.n.     headache. 6. Calcium carbonate/vitamin D 500/200 mg 1 tablet p.o. b.i.d. 7. Diazepam 5 mg p.o. daily p.r.n. anxiety. 8. Hydrocodone/APAP 10/325 mg 1 tablet p.o. q.i.d. p.r.n. pain. 9. Multivitamin 1 tablet p.o. daily. 10.Neurontin 100 mg q.a.m., 200 mg p.o. q.h.s. 11.Prednisone 12.5 mg p.o. daily. 12.PreserVision 2 capsules p.o. b.i.d. 13.Senna/docusate 1 tablet p.o. q.h.s. 14.Clonidine 0.1 mg p.o. b.i.d.  CONSULTATIONS: 1. Physical Therapy. 2. Occupational Therapy. 3. Registered dietitian. 4. Social work.  BRIEF ADMISSION HISTORY OF PRESENT ILLNESS:  The patient is an 77 year old female who was brought to the hospital with a decline in her mentation, failure to thrive, and loose stools.  The patient had recently been  hospitalized from August 18 through August 21 with pyelonephritis and pneumonia.  According to the patient's family, she deteriorated progressively since discharge with diminished activity level, increasing lethargy, and confusion.  Upon initial evaluation in the emergency department, there was some concern that the patient might have recurrent pyelonephritis or Clostridium difficile infection and she therefore was referred to the hospitalist service for further evaluation and treatment.  For full details, please see the dictated report done by Dr. Beverly Gust.  PROCEDURES AND DIAGNOSTIC STUDIES: 1. Chest x-ray on May 29, 2011, showed chronic degenerative     changes with no acute disease. 2. CT scan of the head on May 29, 2011, showed no acute     intracranial pathology.  Mild cortical volume loss and scattered     small vessel ischemic microangiopathy.  Chronic lacunar infarct     along the posterior limb of the right internal capsule. 3. Abdominal ultrasound on May 31, 2011, showed prior     cholecystectomy with no evidence of biliary ductal dilatation or     other acute findings.  DISCHARGE LABORATORY VALUES:  Sodium was 133, potassium 4.4, chloride 101, bicarb 27, BUN 19, creatinine 0.72, glucose 131, calcium 8.2, lipase was 34.  Blood cultures were negative.  TSH was 2.167.  Random cortisol was 14.  HOSPITAL COURSE BY PROBLEM: 1. Metabolic encephalopathy secondary to hyponatremia/SIADH:  The     patient had a sodium level of 122 on admission.  Her hyponatremia     appeared to be chronic because osmolality studies were done on her     prior admission and these were not repeated.  Her hyponatremia was     most consistent with SIADH, given a review of these osmolality     studies and the patient responded promptly to fluid restriction.     Her sodium came up and has ranged from 133 to 135 on a 1000 cc/24-     hour fluid restriction.  Her SSRI therapy was  discontinued on     admission due to the concerns that it may be contributing to     hyponatremia.  At this point, since the patient has responded well     to fluid restriction, we will try her back on Zoloft, but she will     need close followup of her sodium to ensure that it does not drop     further. 2. Hypokalemia:  The patient's potassium was fully repleted with a     discharge potassium of 4.4. 3. Diarrhea in the setting of history of recent Clostridium difficile     infection:  The patient had stool studies ordered for C. diff PCR     on admission, but she did not have any further diarrhea stools.     She was put on Florastor while in the hospital and given therapy     with oral vancomycin for 1 day until it could be confirmed that she     did not have recurrent C. diff.  At this point, there is no     evidence of recurrent C. diff and she does not have any further     diarrhea. 4. Acute pancreatitis:  The patient did complain of left upper     quadrant abdominal pain and ultimately was found to have     hyperlipasemia.  She was made n.p.o. and her symptoms abated within     24 hours.  Lipase normalized to 34 on June 01, 2011, and her     diet was slowly advanced.  An abdominal ultrasound did not show any     pathology, so her acute pancreatitis was thought to be secondary to     hydrochlorothiazide, which has been discontinued. 5. Anemia of chronic disease:  The patient's hemoglobin and hematocrit     have been stable.  The last hemoglobin was 10.2, which is     consistent with her usual baseline. 6. Hypertension:  The patient has had suboptimal control of     hydrochlorothiazide.  She is on the maximal doses of Norvasc and     her lisinopril was increased while in the hospital.  At this point,     we are adding clonidine for better blood pressure control at     discharge. 7. Depression:  The patient did become tearful late in her     hospitalization and admitted to  feelings of depression and     hopelessness.  She denies suicidal intent.  At this point, we are     going to resume her Zoloft; however, she will need close followup     of her sodium to ensure that her sodium remained stable on the     fluid restriction, given the association of SSRIs with hyponatremia     and SIADH. 8. Microvascular cerebrovascular disease:  Again, the patient should     have better blood pressure control which we are attempting to  achieve with blood pressure medication changes. 9. Psoriatic arthritis/chronic pain syndrome/peripheral neuropathy:     The patient is maintained on Neurontin and we will resume the     Zoloft to help with her chronic pain.  She also was on Vicodin as     needed. 10.History of temporal arteritis:  The patient should be maintained on     chronic steroids with routine measurement of her ESR and a slow     taper over time. 11.Paroxysmal atrial fibrillation:  The patient has been maintaining     normal sinus rhythm while in the hospital.  DISPOSITION:  The patient is medically stable with plans to discharge to a skilled nursing facility for rehabilitation when a bed is available.  DISCHARGE INSTRUCTIONS: 1. Activity:  Increase activity slowly, walk with assistance with a     walker. 2. Diet:  Regular, 1000 cc fluid restriction. 3. Followup:  With Dr. Evelena Alvarez or facility MD in 1 week.  CONDITION ON DISCHARGE:  Improved.  Time spent coordinating care for discharge and discharge instructions including face-to-face time and speaking with the patient's daughter-in- law equals 45 minutes.     Hillery Aldo, M.D.     CR/MEDQ  D:  06/03/2011  T:  06/03/2011  Job:  [id number]  cc:   Jo Alvarez, M.D.  Electronically Signed by Hillery Aldo M.D. on 06/04/2011 02:01:33 PM

## 2011-06-04 NOTE — H&P (Signed)
NAMEMarland Kitchen  Jo Alvarez, Jo Alvarez               ACCOUNT NO.:  [id number]  MEDICAL RECORD NO.:  [id number]  LOCATION:                                 FACILITY:  PHYSICIAN:  Talmage Nap, MD  DATE OF BIRTH:  03/10/1925  DATE OF ADMISSION:  05/25/2011 DATE OF DISCHARGE:                             HISTORY & PHYSICAL   PRIMARY CARE PHYSICIAN:  Evelena Peat, M.D.  HISTORY:  Obtainable from patient and patient's daughter-in-law.  CHIEF COMPLAINT:  Generalized decline in mentation, health status, and diarrhea of about 3 days' duration.  Patient is an 77 year old Caucasian female with history of hypertension, psoriatic arthropathy on chronic steroid therapy and wheelchair bound, most recently discharged from the hospital after being treated for UTI as well as C. diff colitis.  Post discharge, patient was said to have been sent home on a home health and had been doing well.  However, 3 to 4 days prior to presenting to the hospital, patient was observed to have a decline in mentation.  At baseline, she is awake, alert, and oriented, but in the past 3 days patient was observed to be talking out of her head, not remembering things she normally would have remembered, and with a disorganized thought.  She was also said to be having repeated episodes of nonbloody diarrhea as well as vomiting and was also complaining about pain in the left flank.  She has subjective feeling of fever, but she denied any chills, she denied any rigor.  Patient normally could move from her bed to the wheelchair, but in the past 3 days because of the repeated episodes of diarrhea and declining in mentation,  she was said to be unable to move herself from the bed to the wheelchair.  Patient's condition was said to be getting progressively worse and this was concerning to family members and subsequently was brought to the hospital to be evaluated.  PAST MEDICAL HISTORY: 1. Positive for hypertension. 2. Most recently  treated for UTI and C. diff colitis. 3. Anemia of chronic disorder. 4. Psoriatic arthropathy. 5. Chronic pain syndrome. 6. Peripheral neuropathy. 7. History of temporal arteritis with peripheral visual loss on the     left eye.  PAST SURGICAL HISTORY: 1. Cataract surgery. 2. Three hip replacement surgeries. 3. Knee arthroscopy. 4. Lower back surgery.  MEDICATIONS:  Her preadmission meds without dosages include the following: 1. Neurontin. 2. Lisinopril/hydrochlorothiazide. 3. Diazepam. 4. Hydrocodone/acetaminophen. 5. Amlodipine. 6. Calcium. 7. Acetaminophen/caffeine/butabarbital. 8. Tagamet. 9. Phentermine. 10.Prednisone.  ALLERGIES:  To LIDOCAINE.  SOCIAL HISTORY:  Negative for alcohol and tobacco use.  Patient lives with relatives and she is said to be wheelchair bound.  FAMILY HISTORY:  Negative for any known systemic illness.  REVIEW OF SYSTEMS:  Patient denies any history of headaches.  No blurred vision.  Complained of nausea, but presently no vomiting.  Has a subjective feeling of fever.  Denies any chills or rigor.  No cough. Denies any chest pain or shortness of breath.  Complained of epigastric discomfort as well as left flank pain with associated multiple episodes of diarrhea, which was said to be nonbloody.  She also complained of occasional dysuria.  She denies any hematuria.  She denies any swelling of the lower extremities.  No intolerance to heat or cold and no neuropsychiatric disorder.  PHYSICAL EXAMINATION:  GENERAL:  Elderly lady, moderately dehydrated, not in any obvious respiratory distress, hearing-impaired. PRESENT VITAL SIGNS:  Blood pressure is 119/55, pulse is 90, respiratory rate 18, temperature is 98.3. HEENT:  Pallor, right pupil reactive to light.  Left pupil slowly reactive to light, but extraocular muscles are intact. NECK:  No jugular venous distention.  No carotid bruit.  No lymphadenopathy. CHEST:  Clear to auscultation. HEART:   Sounds are 1 and 2 with soft systolic murmur at the left sternal border. ABDOMEN:  Soft with tenderness in the epigastric as well as in the left flank.  Liver, spleen, kidney not palpable.  Bowel sounds are positive. EXTREMITIES:  Showed no pedal edema. NEUROLOGIC:  Exam did not show any neuro or lateralizing signs. MUSCULOSKELETAL SYSTEM:  Show arthritic changes in the knees and in the feet with plantar flexion of the foot bilaterally. NEUROPSYCHIATRIC EVALUATION:  Unremarkable. SKIN:  Showed markedly decreased turgor.  LAB DATA:  Her lipase 62.  Chemistry showed sodium of 122, potassium of 3.7, chloride of 82 with a bicarb of 28, glucose is 106, BUN is 34, creatinine is 0.80.  LFTs are normal.  Hematological indices showed WBC of 15.0, hemoglobin 13.2, hematocrit of 37.8, MCV of 90.0 with a platelet count of 373, neutrophil count is 85 and absolute neutrophil count is 12.8.  Urine microscopy showed urine wbc 0 to 2, urine rbc 0 to 2, and urine microscopy unremarkable.  IMAGING STUDIES:  Done on patient include chest x-ray which show no acute disease.  CT of the head without contrast showed no acute intracranial pathology.  There is mild cortical volume loss and small and scattered small vessel ischemic microangiopathy with a chronic lacunar infarct along the posterior limb of the right internal capsule. EKG showed normal sinus rhythm with a rate of 96 with premature beats. There is no acute ST-wave change noted.  IMPRESSION: 1. Altered mental status, etiology is unknown. 2. Hyponatremia. 3. Dehydration. 4. Recurrent diarrhea.  Rule out Clostridium difficile (most recently     patient treated for Clostridium difficile colitis). 5. Questionable pancreatitis. 6. Questionable left pyelonephritis. 7. Peripheral neuropathy with foot drop. 8. History of psoriatic arthropathy on chronic steroid therapy. 9. Hypertension. 10.Chronic pain syndrome secondary to multiple orthopedic  surgery. 11.Anemia of chronic disorder. 12.Generalized deconditioning/wheelchair bound.  PLAN:  Admit patient to general medical floor.  Patient will be rehydrated with normal saline IV to go at rate of 120 cc an hour.  She will be given Zofran 4 mg IV q.4 p.r.n. for nausea.  Patient will empirically be treated for questionable pyelonephritis with Rocephin 1 g IV q.24, and since patient was recently treated for C. diff colitis, she will be on vancomycin 250 mg p.o. t.i.d.  Blood pressure will be maintained with amlodipine 10 mg p.o. daily and lisinopril 10 mg p.o. daily.  For the peripheral neuropathy,  patient will be on Neurontin 200 mg p.o. t.i.d.  Since patient is on chronic steroid therapy for her psoriatic arthropathy, prednisone 10 mg p.o. daily will also be restarted.  GI prophylaxis will be done with Protonix 40 mg IV q.24 and DVT prophylaxis with TED stockings, and also Lovenox 30 mg subcu q.24. Further workup to be done on this patient will include cardiac enzymes q.6 x3, stool for wbc's, ova, parasites, and C. difficile for PCR. Patient will have blood culture as well  as urine culture done before starting IV antibiotics.  CBC, CMP, and magnesium will be repeated in a.m.  Patient will be followed and evaluated on day-to-day basis.     Talmage Nap, MD     CN/MEDQ  D:  05/29/2011  T:  05/30/2011  Job:  [id number]  Electronically Signed by Talmage Nap  on 06/04/2011 06:47:33 AM

## 2011-06-05 LAB — CULTURE, BLOOD (ROUTINE X 2)
Culture  Setup Time: 201209280226
Culture: NO GROWTH

## 2011-07-18 ENCOUNTER — Telehealth: Payer: Self-pay | Admitting: Family Medicine

## 2011-07-18 NOTE — Telephone Encounter (Signed)
Pt was is going to be released from [facility] on Sunday into an assistant living center and needs a follow up appt Monday. Please advise

## 2011-07-18 NOTE — Telephone Encounter (Signed)
Verified with Harriett Sine that appt is to be for 11/23. Appt made.

## 2011-07-18 NOTE — Telephone Encounter (Signed)
I spoke with pt son [name], pt has been in the hospital again and back at [facility].  Her last sodium was 130, which is mod. Low.  They are going to check her sodium again tomorrow and if it is back up around 135, they will release her.  Hospital took her off 1 of her BP meds and she is on a restricted liquid diet.  Son would like to take pt in on Friday, 11/30, decided on a 1:30 time, please create a 30 min slot.  Thanks [employer]

## 2011-07-25 ENCOUNTER — Ambulatory Visit: Payer: Medicare Other | Admitting: Family Medicine

## 2011-07-25 ENCOUNTER — Ambulatory Visit (INDEPENDENT_AMBULATORY_CARE_PROVIDER_SITE_OTHER): Payer: Medicare Other | Admitting: Family Medicine

## 2011-07-25 ENCOUNTER — Encounter: Payer: Self-pay | Admitting: Family Medicine

## 2011-07-25 DIAGNOSIS — I10 Essential (primary) hypertension: Secondary | ICD-10-CM

## 2011-07-25 DIAGNOSIS — M1990 Unspecified osteoarthritis, unspecified site: Secondary | ICD-10-CM

## 2011-07-25 DIAGNOSIS — E871 Hypo-osmolality and hyponatremia: Secondary | ICD-10-CM

## 2011-07-25 DIAGNOSIS — Z23 Encounter for immunization: Secondary | ICD-10-CM

## 2011-07-25 LAB — BASIC METABOLIC PANEL
BUN: 18 mg/dL (ref 6–23)
Chloride: 102 mEq/L (ref 96–112)
Glucose, Bld: 116 mg/dL — ABNORMAL HIGH (ref 70–99)
Potassium: 4 mEq/L (ref 3.5–5.3)
Sodium: 137 mEq/L (ref 135–145)

## 2011-07-25 NOTE — Progress Notes (Signed)
  Subjective:    Patient ID: Jo Alvarez, female    DOB: 04-29-1925, 77 y.o.   MRN: [id number]  HPI  Hospital followup. Patient was admitted with weakness and hyponatremia back in September. Now in assisted living. She's had progressive neuropathy. Her other chronic problems include history of recurrent depression, hypertension, history of temporal arteritis, degenerative joint disease. Medications reviewed. Currently takes Neurontin 100 mg in morning and 200 mg at night along with hydrocodone 10 mg 4 times a day and still has poor pain control. Specially having significant daytime pain.  Pain frequently 9 out of 10 per pt.  Hyponatremia with sodium 124 prior to admission. She had been on hydrochlorothiazide for blood pressure and this was discontinued. Switched to Prinivil 40 mg. Blood pressure stable. Denies any nausea or vomiting. No confusion. Now residing in assisted living and is adapted well. Cannot confirm and flu vaccine  Past Medical History  Diagnosis Date  . HYPONATREMIA 01/01/2009  . ANEMIA 08/20/2009  . LEUKOCYTOSIS 08/20/2009  . DEPRESSION, MAJOR, RECURRENT 02/01/2009  . Unspecified hearing loss 11/17/2008  . HYPERTENSION 11/17/2008  . TEMPORAL ARTERITIS 08/20/2009  . PYELONEPHRITIS 09/10/2009  . Cellulitis and abscess of upper arm and forearm 04/16/2010  . PRESSURE ULCER BUTTOCK 02/01/2009  . DEGENERATIVE JOINT DISEASE 11/17/2008  . BACK PAIN, LUMBAR 07/29/2010  . WEAKNESS 01/01/2009  . Anorexia 12/19/2008  . Headache 04/18/2010  . DIARRHEA, CHRONIC 12/19/2008   Past Surgical History  Procedure Date  . Cholecystectomy   . Abdominal hysterectomy   . Lumbar laminectomy     reports that she has never smoked. She has never used smokeless tobacco. She reports that she does not drink alcohol or use illicit drugs. family history is negative for Cancer, and Hypertension, and Arthritis, . Allergies  Allergen Reactions  . Benazepril Hcl     REACTION: cough  . Epinephrine Hcl    REACTION: shakes  . Lidocaine       Review of Systems  Constitutional: Positive for fatigue. Negative for fever, chills and unexpected weight change.  Respiratory: Negative for cough and shortness of breath.   Cardiovascular: Negative for chest pain, palpitations and leg swelling.  Gastrointestinal: Negative for abdominal pain.  Genitourinary: Negative for dysuria.  Musculoskeletal: Positive for back pain.  Neurological: Positive for weakness. Negative for dizziness and syncope.       Objective:   Physical Exam  Constitutional: She is oriented to person, place, and time. She appears well-developed and well-nourished.  HENT:  Mouth/Throat: Oropharynx is clear and moist.  Neck: Neck supple.  Cardiovascular: Normal rate and regular rhythm.   Pulmonary/Chest: Effort normal and breath sounds normal. No respiratory distress. She has no wheezes. She has no rales.  Musculoskeletal: She exhibits no edema.  Lymphadenopathy:    She has no cervical adenopathy.  Neurological: She is alert and oriented to person, place, and time.  Psychiatric: She has a normal mood and affect. Her behavior is normal.          Assessment & Plan:  #1 recent hyponatremia probably related to poor intake and diuretic use. Now off diuretic. Reassess basic metabolic panel #2 chronic neuropathy pain poorly controlled.  Titrate Neurontin 200 mg each morning and  200 mg at night. Consider other pain management options. We discussed medications such as Duragesic but will look at titration of Neurontin first. #3 Hypertension.  Stable.

## 2011-07-28 NOTE — Progress Notes (Signed)
Quick Note:  Pt aware ______ 

## 2011-07-29 ENCOUNTER — Telehealth: Payer: Self-pay | Admitting: Family Medicine

## 2011-07-29 NOTE — Telephone Encounter (Signed)
Labs faxed, confirmation received

## 2011-07-29 NOTE — Telephone Encounter (Signed)
Req lab results from 07/25/11 esp sodium lvls. Pls call. Also you can fax results to fax# [phone]

## 2011-08-04 ENCOUNTER — Other Ambulatory Visit: Payer: Self-pay | Admitting: *Deleted

## 2011-08-04 MED ORDER — HYDROCODONE-ACETAMINOPHEN 10-325 MG PO TABS: 1.0000 | ORAL_TABLET | Freq: Four times a day (QID) | ORAL | Status: DC | PRN

## 2011-08-04 NOTE — Telephone Encounter (Signed)
REfill OK

## 2011-08-04 NOTE — Telephone Encounter (Signed)
Faxed request from [facility] requesting a hard script for Mr Jo Alvarez, pt uses prn Last filled 02-07-11, #120 with 2 refills Please fax to [phone], attn Anette Riedel

## 2011-08-04 NOTE — Telephone Encounter (Signed)
Rx faxed, confirmation received.

## 2011-08-07 ENCOUNTER — Other Ambulatory Visit: Payer: Self-pay | Admitting: *Deleted

## 2011-08-07 NOTE — Telephone Encounter (Signed)
Med list sent and Dr [name] D/Ced several things from their list, and clarified a few directions

## 2011-08-07 NOTE — Telephone Encounter (Signed)
Forms were all faxed, confirmation received.  Will scan forms to pt chart

## 2011-09-09 ENCOUNTER — Telehealth: Payer: Self-pay | Admitting: *Deleted

## 2011-09-09 NOTE — Telephone Encounter (Signed)
Please call Anette Riedel (nurse at [facility] re: clarification on pt's Prednisone dosage and Zoloft, please.

## 2011-09-10 NOTE — Telephone Encounter (Signed)
I called Anette Riedel, got his personally identified VM.  Explained Dr Eustace Quail prescribes this med, our records say 1/2 tab daily.  Please check with his office with questions.  Zoloft 50 mg is 1 and 1/2 tab daily

## 2011-09-15 ENCOUNTER — Telehealth: Payer: Self-pay | Admitting: *Deleted

## 2011-09-15 MED ORDER — CALCIUM-VITAMIN D 500-200 MG-UNIT PO TABS: 1.0000 | ORAL_TABLET | Freq: Two times a day (BID) | ORAL | Status: AC

## 2011-09-15 NOTE — Telephone Encounter (Signed)
Refill on calcium with vitamin d

## 2011-10-06 ENCOUNTER — Ambulatory Visit (INDEPENDENT_AMBULATORY_CARE_PROVIDER_SITE_OTHER): Payer: Medicare Other | Admitting: Family Medicine

## 2011-10-06 ENCOUNTER — Encounter: Payer: Self-pay | Admitting: Family Medicine

## 2011-10-06 DIAGNOSIS — L8990 Pressure ulcer of unspecified site, unspecified stage: Secondary | ICD-10-CM

## 2011-10-06 DIAGNOSIS — M316 Other giant cell arteritis: Secondary | ICD-10-CM

## 2011-10-06 DIAGNOSIS — G629 Polyneuropathy, unspecified: Secondary | ICD-10-CM

## 2011-10-06 DIAGNOSIS — F339 Major depressive disorder, recurrent, unspecified: Secondary | ICD-10-CM

## 2011-10-06 DIAGNOSIS — G609 Hereditary and idiopathic neuropathy, unspecified: Secondary | ICD-10-CM

## 2011-10-06 DIAGNOSIS — I10 Essential (primary) hypertension: Secondary | ICD-10-CM

## 2011-10-06 DIAGNOSIS — L8992 Pressure ulcer of unspecified site, stage 2: Secondary | ICD-10-CM | POA: Insufficient documentation

## 2011-10-06 LAB — SEDIMENTATION RATE: Sed Rate: 23 mm/hr — ABNORMAL HIGH (ref 0–22)

## 2011-10-06 NOTE — Progress Notes (Signed)
Subjective:    Patient ID: Jo Alvarez, female    DOB: 26-Dec-1924, 78 y.o.   MRN: [id number]  HPI  Multiple medical problems. Recently went to assisted living complex. She has history of hypertension, chronic intermittent diarrhea, temporal arteritis, chronic low back pain, chronic neuropathy pain, osteoarthritis involving multiple joints, chronic hearing loss, and recent stage II pressure ulcer buttock region. Patient has home health visiting for skin care issues.  Recent concerns for poor pain control. She has neuropathy pain as well as chronic right knee pain. Previous right knee replacement. No fall or injury. She's been on hydrocodone 10 mg and apparently receiving every 6 hours as needed and frequently delayed in receiving after requesting. Has not been dosed around-the-clock. Patient feels pain medicine helps and may be adequate if given on a regular basis. She was receiving every 6 hours regularly prior to admission to assisted living.  Pressure ulcer or sacral region and buttock. Stage II. Followed by home health nursing.  History of temporal arteritis. Previously prednisone dosing per rheumatology. Last sedimentation rate unknown. Currently prednisone 10 mg daily. History of some chronic visual loss in one. Intermittent headaches.  Past Medical History  Diagnosis Date  . HYPONATREMIA 01/01/2009  . ANEMIA 08/20/2009  . LEUKOCYTOSIS 08/20/2009  . DEPRESSION, MAJOR, RECURRENT 02/01/2009  . Unspecified hearing loss 11/17/2008  . HYPERTENSION 11/17/2008  . TEMPORAL ARTERITIS 08/20/2009  . PYELONEPHRITIS 09/10/2009  . Cellulitis and abscess of upper arm and forearm 04/16/2010  . PRESSURE ULCER BUTTOCK 02/01/2009  . DEGENERATIVE JOINT DISEASE 11/17/2008  . BACK PAIN, LUMBAR 07/29/2010  . WEAKNESS 01/01/2009  . Anorexia 12/19/2008  . Headache 04/18/2010  . DIARRHEA, CHRONIC 12/19/2008   Past Surgical History  Procedure Date  . Cholecystectomy   . Abdominal hysterectomy   . Lumbar laminectomy      reports that she has never smoked. She has never used smokeless tobacco. She reports that she does not drink alcohol or use illicit drugs. family history is negative for Cancer, and Hypertension, and Arthritis, . Allergies  Allergen Reactions  . Benazepril Hcl     REACTION: cough  . Epinephrine Hcl     REACTION: shakes  . Lidocaine       Review of Systems  Constitutional: Positive for fatigue.  HENT: Positive for hearing loss.   Respiratory: Positive for cough. Negative for shortness of breath and wheezing.   Cardiovascular: Negative for chest pain, palpitations and leg swelling.  Gastrointestinal: Negative for nausea, vomiting and abdominal pain.  Musculoskeletal: Positive for back pain and arthralgias. Negative for myalgias.  Skin: Negative for rash.  Neurological: Positive for headaches.  Psychiatric/Behavioral: Negative for confusion and agitation.       Objective:   Physical Exam  Constitutional:       Patient is alert hard of hearing in no distress  HENT:  Right Ear: External ear normal.  Left Ear: External ear normal.       Oropharynx slightly dry  Neck: Neck supple.  Cardiovascular: Normal rate and regular rhythm.   Pulmonary/Chest: Effort normal and breath sounds normal. No respiratory distress. She has no wheezes. She has no rales.  Musculoskeletal: She exhibits no edema.       Full range of motion right knee. No effusion  Psychiatric: She has a normal mood and affect.          Assessment & Plan:  #1 chronic pain related to peripheral neuropathy and osteoarthritis. She also has chronic low back pain. Change Norco 10 mg/325  mg to one every 6 hours scheduled and not when necessary. If remains poorly controlled consider alternatives such as Duragesic patch  #2 history of temporal arteritis. Check sedimentation rate and order for monthly sed rates to be faxed here. Adjust prednisone accordingly #3 sacral/buttock stage 2 pressure ulcer.  Per family  improving. No infection.  Followed by skin care nurse with [facility].

## 2011-10-09 NOTE — Progress Notes (Signed)
Quick Note:  Pt son informed ______ 

## 2011-10-10 ENCOUNTER — Telehealth: Payer: Self-pay | Admitting: *Deleted

## 2011-10-10 DIAGNOSIS — M1990 Unspecified osteoarthritis, unspecified site: Secondary | ICD-10-CM

## 2011-10-10 NOTE — Telephone Encounter (Signed)
Please call [facility] regarding lab orders. They did labs this morning, and results are back. They just received another order from [place], for different labs. They do not know which to do. Please advise.

## 2011-10-10 NOTE — Telephone Encounter (Signed)
Pt son informed we received a fax requesting refill of Jo Alvarez 10 mg.  Per Dr [name], pt needs labs first, CBC and hepatic Per son [facility] can draw labs for pt.  Will fax order to them.

## 2011-10-14 ENCOUNTER — Telehealth: Payer: Self-pay | Admitting: Family Medicine

## 2011-10-14 NOTE — Telephone Encounter (Signed)
Pt injured r ankle when out with son. [facility] wants verbal order for xray on R foot & ankle. Please call. Thanks!

## 2011-10-14 NOTE — Telephone Encounter (Signed)
Per Dr [name], Anette Riedel was given OK with verbal order for x-ray of right foot and ankle Anette Riedel will fax over an order for him to sign

## 2011-10-15 ENCOUNTER — Emergency Department (HOSPITAL_COMMUNITY)
Admission: EM | Admit: 2011-10-15 | Discharge: 2011-10-15 | Disposition: A | Discharge status: Home / Self Care | Payer: Medicare Other | Attending: Emergency Medicine | Admitting: Emergency Medicine

## 2011-10-15 ENCOUNTER — Other Ambulatory Visit: Payer: Self-pay | Admitting: *Deleted

## 2011-10-15 ENCOUNTER — Emergency Department (HOSPITAL_COMMUNITY): Payer: Medicare Other

## 2011-10-15 ENCOUNTER — Encounter (HOSPITAL_COMMUNITY): Payer: Self-pay | Admitting: Emergency Medicine

## 2011-10-15 ENCOUNTER — Telehealth: Payer: Self-pay | Admitting: *Deleted

## 2011-10-15 DIAGNOSIS — S82409A Unspecified fracture of shaft of unspecified fibula, initial encounter for closed fracture: Secondary | ICD-10-CM | POA: Insufficient documentation

## 2011-10-15 DIAGNOSIS — G622 Polyneuropathy due to other toxic agents: Secondary | ICD-10-CM | POA: Insufficient documentation

## 2011-10-15 DIAGNOSIS — I10 Essential (primary) hypertension: Secondary | ICD-10-CM | POA: Insufficient documentation

## 2011-10-15 DIAGNOSIS — Z79899 Other long term (current) drug therapy: Secondary | ICD-10-CM | POA: Insufficient documentation

## 2011-10-15 DIAGNOSIS — M25579 Pain in unspecified ankle and joints of unspecified foot: Secondary | ICD-10-CM | POA: Insufficient documentation

## 2011-10-15 DIAGNOSIS — S82401A Unspecified fracture of shaft of right fibula, initial encounter for closed fracture: Secondary | ICD-10-CM

## 2011-10-15 DIAGNOSIS — X500XXA Overexertion from strenuous movement or load, initial encounter: Secondary | ICD-10-CM | POA: Insufficient documentation

## 2011-10-15 DIAGNOSIS — G6189 Other inflammatory polyneuropathies: Secondary | ICD-10-CM | POA: Insufficient documentation

## 2011-10-15 HISTORY — DX: Personal history of other malignant neoplasm of skin: Z85.828

## 2011-10-15 HISTORY — DX: Encounter for other specified aftercare: Z51.89

## 2011-10-15 HISTORY — DX: Reserved for inherently not codable concepts without codable children: IMO0001

## 2011-10-15 HISTORY — DX: Personal history of other diseases of the circulatory system: Z86.79

## 2011-10-15 HISTORY — DX: Personal history of other endocrine, nutritional and metabolic disease: Z86.39

## 2011-10-15 HISTORY — DX: Anxiety disorder, unspecified: F41.9

## 2011-10-15 HISTORY — DX: Foot drop, right foot: M21.371

## 2011-10-15 HISTORY — DX: Cardiac arrhythmia, unspecified: I49.9

## 2011-10-15 HISTORY — DX: Chronic inflammatory demyelinating polyneuritis: G61.81

## 2011-10-15 HISTORY — DX: Enterocolitis due to Clostridium difficile, not specified as recurrent: A04.72

## 2011-10-15 HISTORY — DX: Personal history of urinary (tract) infections: Z87.440

## 2011-10-15 HISTORY — DX: Acute pancreatitis without necrosis or infection, unspecified: K85.90

## 2011-10-15 HISTORY — DX: Foot drop, left foot: M21.372

## 2011-10-15 HISTORY — DX: Stress incontinence (female) (male): N39.3

## 2011-10-15 HISTORY — DX: Personal history of diseases of the skin and subcutaneous tissue: Z87.2

## 2011-10-15 MED ORDER — LEFLUNOMIDE 10 MG PO TABS: ORAL_TABLET | ORAL | Status: DC

## 2011-10-15 NOTE — Telephone Encounter (Signed)
Tammy from [facility] called to report pt does have right ankle fx.  Son wanted to take her to Sports Medicine, however that office cannot see her today.  Tammy requesting verbal order to have EMS transport her to ER.  Tammy concerned about DVT and this injury took place 3 days ago.  (Verbal order was given, will inform Dr [name] now to confirm OK) Call back number [phone]

## 2011-10-15 NOTE — Telephone Encounter (Signed)
Labs done.  Refill medication for 6 months

## 2011-10-15 NOTE — ED Provider Notes (Signed)
History     CSN: [id number]  Arrival date & time 10/15/11  1117   First MD Initiated Contact with Patient 10/15/11 1128      Chief Complaint  Patient presents with  . Ankle Injury    (Consider location/radiation/quality/duration/timing/severity/associated sxs/prior treatment) Patient is a 78 y.o. female presenting with lower extremity injury. The history is provided by the patient.  Ankle Injury This is a new problem. The current episode started in the past 7 days. The problem occurs constantly. The problem has been gradually improving. Associated symptoms include joint swelling. Pertinent negatives include no abdominal pain, chest pain, chills, coughing, fever, headaches, nausea, neck pain, numbness, rash, vomiting or weakness. Associated symptoms comments: Positive for swelling, pain, bruising and difficulty bearing weight. Negative for numbness, weakness, laceration, bleeding.. Exacerbated by: bearing weight, palpation. She has tried ice for the symptoms. The treatment provided no relief.  Pt reports "catching my foot on something" on Sunday (3 days ago) without a fall. Has had difficulty with weight bearing assoc with swelling and pain since that time; pt does note her pain has improved in the last day. X-ray done at [facility] demonstrated ankle rx with displacement, per triage note.   Past Medical History  Diagnosis Date  . HYPONATREMIA 01/01/2009  . ANEMIA 08/20/2009  . LEUKOCYTOSIS 08/20/2009  . DEPRESSION, MAJOR, RECURRENT 02/01/2009  . Unspecified hearing loss 11/17/2008  . HYPERTENSION 11/17/2008  . TEMPORAL ARTERITIS 08/20/2009  . PYELONEPHRITIS 09/10/2009  . Cellulitis and abscess of upper arm and forearm 04/16/2010  . PRESSURE ULCER BUTTOCK 02/01/2009  . BACK PAIN, LUMBAR 07/29/2010  . WEAKNESS 01/01/2009  . Anorexia 12/19/2008  . Headache 04/18/2010  . DIARRHEA, CHRONIC 12/19/2008  . Stress incontinence   . Hx: UTI (urinary tract infection)   . Blood transfusion   . Foot  drop, bilateral   . Clostridium difficile colitis     History of  . Pancreatitis     History of  . Anxiety   . DEGENERATIVE JOINT DISEASE 11/17/2008    shoulders, arms  . History of skin cancer   . Dysrhythmia     Hx of paroxysmal a-fib  . H/O psoriatic arthritis   . History of SIADH   . H/O hypokalemia   . H/O mitral valve prolapse   . 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 Cholecystectomy   . Abdominal hysterectomy   . Lumbar laminectomy   . Temporal artery biopsy / ligation 06/04/2009  . Right cataract extraction     Family History  Problem Relation Age of Onset  . Cancer Neg Hx     family  . Hypertension Neg Hx     family  . Arthritis Neg Hx     family    History  Substance Use Topics  . Smoking status: Never Smoker   . Smokeless tobacco: Never Used  . Alcohol Use: No     Review of Systems  Constitutional: Negative for fever and chills.  HENT: Negative for ear pain, neck pain and neck stiffness.   Eyes: Negative for pain and visual disturbance.  Respiratory: Negative for cough, chest tightness and shortness of breath.   Cardiovascular: Negative for chest pain.  Gastrointestinal: Negative for nausea, vomiting, abdominal pain and diarrhea.  Genitourinary: Negative for dysuria and hematuria.  Musculoskeletal: Positive for joint swelling and gait problem. Negative for back pain.  Skin: Positive for color change. Negative for  rash and wound.  Neurological: Negative for syncope, weakness, numbness and headaches.  Hematological: Does not bruise/bleed easily.  Psychiatric/Behavioral: Negative for confusion.    Allergies  Benazepril hcl; Epinephrine hcl; and Lidocaine  Home Medications   Current Outpatient Rx  Name Route Sig Dispense Refill  . AMLODIPINE BESYLATE 10 MG PO TABS  TAKE 1 TABLET EVERY DAY 30 tablet 11  . CALCIUM-VITAMIN D 500-200 MG-UNIT PO TABS Oral Take 1 tablet by  mouth 2 (two) times daily with a meal. 60 tablet 11  . CLONIDINE HCL 0.1 MG PO TABS Oral Take 0.1 mg by mouth daily.     Marland Kitchen DIAZEPAM 5 MG PO TABS Oral Take 5 mg by mouth every 12 (twelve) hours as needed. anxiety     . GABAPENTIN 100 MG PO CAPS Oral Take 200 mg by mouth 2 (two) times daily. 2 tabs in the AM and 2 tabs in the PM    . HYDROCODONE-ACETAMINOPHEN 10-325 MG PO TABS Oral Take 1 tablet by mouth every 6 (six) hours.    . LEFLUNOMIDE 10 MG PO TABS  1 and 1/2 tab daily 45 tablet 5  . LISINOPRIL 40 MG PO TABS Oral Take 40 mg by mouth daily.     . [employer] SPECTRAVITE PO Oral Take by mouth daily.      Marland Kitchen PRESERVISION AREDS 2 PO Oral Take 1 tablet by mouth 2 (two) times daily.    Marland Kitchen PREDNISONE 10 MG PO TABS Oral Take 10 mg by mouth daily.    Marland Kitchen RANITIDINE HCL 150 MG PO TABS  150 mg 2 (two) times daily.     . SENNOSIDES 8.6 MG PO TABS Oral Take 1 tablet by mouth daily.     . SERTRALINE HCL 50 MG PO TABS Oral Take 25 mg by mouth daily. Take 1 and 1/2 tab daily (total 75 mg)    . BAZA PROTECT EX CREA Topical Apply 1 application topically daily as needed.       BP 145/61  Pulse 72  Temp(Src) 98.1 F (36.7 C) (Oral)  Resp 16  SpO2 99%  Physical Exam  Nursing note and vitals reviewed. Constitutional: She is oriented to person, place, and time. She appears well-developed and well-nourished. No distress.  HENT:  Head: Normocephalic and atraumatic.  Right Ear: External ear normal.  Left Ear: External ear normal.  Eyes: Conjunctivae are normal.  Neck: Neck supple.  Cardiovascular: Normal rate, regular rhythm and intact distal pulses.   Pulmonary/Chest: Effort normal. No respiratory distress.  Abdominal: Soft. She exhibits no distension. There is no tenderness.  Musculoskeletal:       Right ankle: She exhibits decreased range of motion, swelling and ecchymosis. She exhibits no laceration and normal pulse. tenderness. Lateral malleolus tenderness found. No medial malleolus, no head of 5th  metatarsal and no proximal fibula tenderness found.       Decreased strength and decreased AROM to right ankle with assoc mild inversion at rest. Ecchymosis/swelling over lateral malleolus. Distal sensation intact. Full AROM of toes with good strength. Intact DP pulse.  Neurological: She is alert and oriented to person, place, and time. No sensory deficit.  Skin: Skin is dry. No erythema.  Psychiatric: She has a normal mood and affect.    ED Course  Procedures (including critical care time)  Labs Reviewed - No data to display Dg Ankle Complete Right  10/15/2011  *RADIOLOGY REPORT*  Clinical Data: Fall, lateral pain.  RIGHT ANKLE - COMPLETE 3+ VIEW  Comparison: None.  Findings: Minimally-displaced oblique fracture through the distal right fibula.  No tibial abnormality.  Soft tissues are intact. Mild lateral soft tissue swelling.  IMPRESSION: Minimally-displaced oblique distal right fibular fracture.  Original Report Authenticated By: Cyndie Chime, M.D.     1. Closed right fibular fracture       MDM  X-ray reviewed by myself, minimally displaced distal fibula fx as per radiologist reading. Incident several days ago. No calf swelling or tenderness to suggest DVT. Ankle splint with posterior and bilateral support placed by ortho tech. Pt to be d/c home to [facility]. Reports to me she has an orthopedist but does not remember his name- no note on chart from ortho. Have advised that she follow-up with her ortho but if unable, she will f/u with on-call ortho for further eval of ankle.        Shaaron Adler, PA-C 10/15/11 1501

## 2011-10-15 NOTE — ED Notes (Signed)
ZOX:[id number]<AV> Expected date:10/15/11<BR> Expected time:11:02 AM<BR> Means of arrival:Ambulance<BR> Comments:<BR> Leg injury

## 2011-10-15 NOTE — Telephone Encounter (Signed)
Tammy given verbal order earlier

## 2011-10-15 NOTE — ED Notes (Signed)
Per EMS.  Pt from [facility].  Pt fell Sunday and mobile xray showed R ankle fracture with slight displacement.  Ankle swollen.  Pulses, sensation and movement present.

## 2011-10-15 NOTE — Telephone Encounter (Signed)
This was done earlier today, orders only entry

## 2011-10-15 NOTE — ED Notes (Signed)
Assist  ortho tech with splint

## 2011-10-15 NOTE — Telephone Encounter (Signed)
Recommend EMS transport to ED for further evaluation

## 2011-10-15 NOTE — Discharge Instructions (Signed)
As discussed, your x-ray showed a fracture of your fibula that is slightly displaced. You have had a splint placed but need to call your orthopedic doctor to arrange a follow-up to ensure appropriate treatment for this broken bone. You should not try to walk on your right ankle until after you see the orthopedic doctor. You can take the pain medication already prescribed to you by your regular doctor as needed.     Fibular Fracture, Adult, Treated Without Immobilization You have a fracture (break) of your fibula. This is the bone in your lower leg located on the outside of the leg. These fractures are easily diagnosed with x-rays. TREATMENT  You have a simple fracture of the part of the fibula which is located between the knee and ankle. This bone usually will heal without problems and can often be treated without casting or splinting. This means the fracture will heal well during normal use and daily activities without being held in place. Sometimes a cast or splint is placed on these fractures if it is needed for comfort or if the bones are badly out of place. HOME CARE INSTRUCTIONS   Apply ice to the injury for 15 to 20 minutes, 3 to 4 times per day while awake, for 2 days. Put the ice in a plastic bag and place a thin towel between the bag of ice and your leg. This helps keep swelling down.   Use crutches as directed. Resume walking without crutches as directed by your caregiver or when comfortable doing so.   Only take over-the-counter or prescription medicines for pain, discomfort, or fever as directed by your caregiver.   Your caregiver may tell you to take off your removable cam boot.   Keep appointments for follow up X-rays if these are required.   Warning: Do not drive a car or operate a motor vehicle until your caregiver specifically tells you it is safe to do so.  SEEK MEDICAL CARE IF:   You have continued severe pain or more swelling.   The medications do not control the pain.     Your skin or nails below the injury turn blue or grey or feel cold or numb.   You develop severe pain in the leg or foot.  MAKE SURE YOU:   Understand these instructions.   Will watch your condition.   Will get help right away if you are not doing well or get worse.  Document Released: 08/18/2005 Document Revised: 04/30/2011 Document Reviewed: 03/24/2008 [facility] Patient Information 2012 [place], [place].

## 2011-10-17 NOTE — ED Provider Notes (Signed)
Medical screening examination/treatment/procedure(s) were conducted as a shared visit with non-physician practitioner(s) and myself.  I personally evaluated the patient during the encounter  Cyndra Numbers, MD 10/17/11 [phone]

## 2011-10-22 ENCOUNTER — Encounter: Payer: Self-pay | Admitting: Family Medicine

## 2011-10-24 ENCOUNTER — Emergency Department (HOSPITAL_COMMUNITY)
Admission: EM | Admit: 2011-10-24 | Discharge: 2011-10-24 | Disposition: A | Discharge status: Home / Self Care | Payer: Medicare Other | Attending: Emergency Medicine | Admitting: Emergency Medicine

## 2011-10-24 ENCOUNTER — Encounter (HOSPITAL_COMMUNITY): Payer: Self-pay | Admitting: Emergency Medicine

## 2011-10-24 DIAGNOSIS — I10 Essential (primary) hypertension: Secondary | ICD-10-CM | POA: Insufficient documentation

## 2011-10-24 DIAGNOSIS — F339 Major depressive disorder, recurrent, unspecified: Secondary | ICD-10-CM | POA: Insufficient documentation

## 2011-10-24 DIAGNOSIS — K623 Rectal prolapse: Secondary | ICD-10-CM | POA: Insufficient documentation

## 2011-10-24 MED ORDER — DOCUSATE SODIUM 100 MG PO CAPS: 100.0000 mg | ORAL_CAPSULE | Freq: Two times a day (BID) | ORAL | Status: DC

## 2011-10-24 NOTE — ED Notes (Signed)
NWG:[id number]<ZH> Expected date:10/24/11<BR> Expected time: 3:13 PM<BR> Means of arrival:Ambulance<BR> Comments:<BR> M40. 78 yo f. Prolapsed rectum. 20 mins

## 2011-10-24 NOTE — ED Notes (Addendum)
Per EMS: pt sent from [facility] for prolapsed rectum approx 45 min pta while having a BM.  Pt with h/o same but states it usually resolves itself.  Pt denies any pain or discomfort.

## 2011-10-24 NOTE — Discharge Instructions (Signed)
Prolapse  Prolapse means the falling down, bulging, dropping, or drooping of a body part. Organs that commonly prolapse include the rectum, small intestine, bladder, urethra, vagina (birth canal), uterus (womb), and cervix. Prolapse occurs when the ligaments and muscle tissue around the rectum, bladder, and uterus are damaged or weakened.  CAUSES  This happens especially with:  Childbirth. Some women feel pelvic pressure or have trouble holding their urine right after childbirth, because of stretching and tearing of pelvic tissues. This generally gets better with time and the feeling usually goes away, but it may return with aging.   Chronic heavy lifting.   Aging.   Menopause, with loss of estrogen production weakening the pelvic ligaments and muscles.   Past pelvic surgery.   Obesity.   Chronic constipation.   Chronic cough.  Prolapse may affect a single organ, or several organs may prolapse at the same time. The front wall of the vagina holds up the bladder. The back wall holds up part of the lower intestine, or rectum. The uterus fills a spot in the middle. All these organs can be involved when the ligaments and muscles around the vagina relax too much. This often gets worse when women stop producing estrogen (menopause). SYMPTOMS  Uncontrolled loss of urine (incontinence) with cough, sneeze, straining, and exercise.   More force may be required to have a bowel movement, due to trapping of the stool.   When part of an organ bulges through the opening of the vagina, there is sometimes a feeling of heaviness or pressure. It may feel as though something is falling out. This sensation increases with coughing or bearing down.   If the organs protrude through the opening of the vagina and rub against the clothing, there may be soreness, ulcers, infection, pain, and bleeding.   Lower back pain.   Pushing in the upper or lower part of the vagina, to pass urine or have a bowel movement.     Problems having sexual intercourse.   Being unable to insert a tampon or applicator.  DIAGNOSIS  Usually, a physical exam is all that is needed to identify the problem. During the examination, you may be asked to cough and strain while lying down, sitting up, and standing up. Your caregiver will determine if more testing is required, such as bladder function tests. Some diagnoses are:  Cystocele: Bulging and falling of the bladder into the top of the vagina.   Rectocele: Part of the rectum bulging into the vagina.   Prolapse of the uterus: The uterus falls or drops into the vagina.   Enterocele: Bulging of the top of the vagina, after a hysterectomy (uterus removal), with the small intestine bulging into the vagina. A hernia in the top of the vagina.   Urethrocele: The urethra (urine carrying tube) bulging into the vagina.  TREATMENT  In most cases, prolapse needs to be treated only if it produces symptoms. If the symptoms are interfering with your usual daily or sexual activities, treatment may be necessary. The following are some measures that may be used to treat prolapse.  Estrogen may help elderly women with mild prolapse.   Kegel exercises may help mild cases of prolapse, by strengthening and tightening the muscles of the pelvic floor.   Pessaries are used in women who choose not to, or are unable to, have surgery. A pessary is a doughnut-shaped piece of plastic or rubber that is put into the vagina to keep the organs in place. This device must   be fitted by your caregiver. Your caregiver will also explain how to care for yourself with the pessary. If it works well for you, this may be the only treatment required.   Surgery is often the only form of treatment for more severe prolapses. There are different types of surgery available. You should discuss what the best procedure is for you. If the uterus is prolapsed, it may be removed (hysterectomy) as part of the surgical treatment.  Your caregiver will discuss the risks and benefits with you.   Uterine-vaginal suspension (surgery to hold up the organs) may be used, especially if you want to maintain your fertility.  No form of treatment is guaranteed to correct the prolapse or relieve the symptoms. HOME CARE INSTRUCTIONS   Wear a sanitary pad or absorbent product if you have incontinence of urine.   Avoid heavy lifting and straining with exercise and work.   Take over-the-counter pain medicine for minor discomfort.   Try taking estrogen or using estrogen vaginal cream.   Try Kegel exercises or use a pessary, before deciding to have surgery.   Do Kegel exercises after having a baby.  SEEK MEDICAL CARE IF:   Your symptoms interfere with your daily activities.   You need medicine to help with the discomfort.   You need to be fitted with a pessary.   You notice bleeding from the vagina.   You think you have ulcers or you notice ulcers on the cervix.   You have an oral temperature above 102 F (38.9 C).   You develop pain or blood with urination.   You have bleeding with a bowel movement.   The symptoms are interfering with your sex life.   You have urinary incontinence that interferes with your daily activities.   You lose urine with sexual intercourse.   You have a chronic cough.   You have chronic constipation.  Document Released: 02/22/2003 Document Revised: 04/30/2011 Document Reviewed: 09/02/2009 [employer] Patient Information 2012 [employer]. 

## 2011-10-24 NOTE — ED Notes (Signed)
Pt changed back into clothing.  [facility] called to transport pt back to facility.

## 2011-10-24 NOTE — ED Provider Notes (Addendum)
History     CSN: [id number]  Arrival date & time 10/24/11  1514   First MD Initiated Contact with Patient 10/24/11 1547      Chief Complaint  Patient presents with  . Prolapsed Rectum     (Consider location/radiation/quality/duration/timing/severity/associated sxs/prior treatment) The history is provided by the patient.   patient presents with prolapsed rectum. Patient is issues moving her bowels today and followed also her rectal area. Has had some hard stools. Denies any abdominal pain, fever, vomiting. No bloody stools. History of prolapse rectum in the past. Patient states that normally she does not strain to move her bowels but today she did.  Past Medical History  Diagnosis Date  . HYPONATREMIA 01/01/2009  . ANEMIA 08/20/2009  . LEUKOCYTOSIS 08/20/2009  . DEPRESSION, MAJOR, RECURRENT 02/01/2009  . Unspecified hearing loss 11/17/2008  . HYPERTENSION 11/17/2008  . TEMPORAL ARTERITIS 08/20/2009  . PYELONEPHRITIS 09/10/2009  . Cellulitis and abscess of upper arm and forearm 04/16/2010  . PRESSURE ULCER BUTTOCK 02/01/2009  . BACK PAIN, LUMBAR 07/29/2010  . WEAKNESS 01/01/2009  . Anorexia 12/19/2008  . Headache 04/18/2010  . DIARRHEA, CHRONIC 12/19/2008  . Stress incontinence   . Hx: UTI (urinary tract infection)   . Blood transfusion   . Foot drop, bilateral   . Clostridium difficile colitis     History of  . Pancreatitis     History of  . Anxiety   . DEGENERATIVE JOINT DISEASE 11/17/2008    shoulders, arms  . History of skin cancer   . Dysrhythmia     Hx of paroxysmal a-fib  . H/O psoriatic arthritis   . History of SIADH   . H/O hypokalemia   . H/O mitral valve prolapse   . 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 Cholecystectomy   . Abdominal hysterectomy   . Lumbar laminectomy   . Temporal artery biopsy / ligation 06/04/2009  . Right cataract extraction     Family History  Problem Relation Age  of Onset  . Cancer Neg Hx     family  . Hypertension Neg Hx     family  . Arthritis Neg Hx     family    History  Substance Use Topics  . Smoking status: Never Smoker   . Smokeless tobacco: Never Used  . Alcohol Use: No    OB History    Grav Para Term Preterm Abortions TAB SAB Ect Mult Living                  Review of Systems  All other systems reviewed and are negative.    Allergies  Benazepril hcl; Epinephrine hcl; and Lidocaine  Home Medications   Current Outpatient Rx  Name Route Sig Dispense Refill  . AMLODIPINE BESYLATE 10 MG PO TABS  TAKE 1 TABLET EVERY DAY 30 tablet 11  . CALCIUM-VITAMIN D 500-200 MG-UNIT PO TABS Oral Take 1 tablet by mouth 2 (two) times daily with a meal. 60 tablet 11  . CLONIDINE HCL 0.1 MG PO TABS Oral Take 0.1 mg by mouth daily.     Marland Kitchen DIAZEPAM 5 MG PO TABS Oral Take 5 mg by mouth every 12 (twelve) hours as needed. anxiety    . GABAPENTIN 100 MG PO CAPS Oral Take 200 mg by mouth 2 (two) times daily. 2 tabs in the AM and 2 tabs in the PM    . LEFLUNOMIDE  10 MG PO TABS  1 and 1/2 tab daily 45 tablet 5  . LISINOPRIL 40 MG PO TABS Oral Take 40 mg by mouth daily.     . [facility] SPECTRAVITE PO Oral Take by mouth daily.      Marland Kitchen PRESERVISION AREDS 2 PO Oral Take 1 tablet by mouth 2 (two) times daily.    Marland Kitchen PREDNISONE 10 MG PO TABS Oral Take 10 mg by mouth daily.    . SENNOSIDES 8.6 MG PO TABS Oral Take 1 tablet by mouth daily.     . SERTRALINE HCL 50 MG PO TABS Oral Take 25 mg by mouth daily. Take 1 and 1/2 tab daily (total 75 mg)    . BAZA PROTECT EX CREA Topical Apply 1 application topically daily as needed.       BP 128/56  Pulse 80  Temp(Src) 97.9 F (36.6 C) (Oral)  Resp 18  Ht 5\' 5"  (1.651 m)  Wt 127 lb (57.607 kg)  BMI 21.13 kg/m2  SpO2 98%  Physical Exam  Nursing note and vitals reviewed. Constitutional: She is oriented to person, place, and time. She appears well-developed and well-nourished.  Non-toxic appearance. No distress.    HENT:  Head: Normocephalic and atraumatic.  Eyes: Conjunctivae, EOM and lids are normal. Pupils are equal, round, and reactive to light.  Neck: Normal range of motion. Neck supple. No tracheal deviation present. No mass present.  Cardiovascular: Normal rate, regular rhythm and normal heart sounds.  Exam reveals no gallop.   No murmur heard. Pulmonary/Chest: Effort normal and breath sounds normal. No stridor. No respiratory distress. She has no decreased breath sounds. She has no wheezes. She has no rhonchi. She has no rales.  Abdominal: Soft. Normal appearance and bowel sounds are normal. She exhibits no distension. There is no tenderness. There is no rebound and no CVA tenderness.  Genitourinary:       Prolapsed rectum noted to  Musculoskeletal: Normal range of motion. She exhibits no edema and no tenderness.  Neurological: She is alert and oriented to person, place, and time. She has normal strength. No cranial nerve deficit or sensory deficit. GCS eye subscore is 4. GCS verbal subscore is 5. GCS motor subscore is 6.  Skin: Skin is warm and dry. No abrasion and no rash noted.  Psychiatric: She has a normal mood and affect. Her speech is normal and behavior is normal.    ED Course  Procedures (including critical care time)  Labs Reviewed - No data to display No results found.   No diagnosis found.    MDM  Patient's rectum was manually reduced. Will place patient on stool softeners and she will be given surgical referral        Toy Baker, MD 10/24/11 1613  Toy Baker, MD 10/24/11 [phone]

## 2011-11-06 ENCOUNTER — Other Ambulatory Visit: Payer: Self-pay | Admitting: *Deleted

## 2011-11-06 MED ORDER — CLONIDINE HCL 0.1 MG PO TABS: 0.1000 mg | ORAL_TABLET | Freq: Every day | ORAL | Status: DC

## 2011-11-06 MED ORDER — LISINOPRIL 40 MG PO TABS: 40.0000 mg | ORAL_TABLET | Freq: Every day | ORAL | Status: DC

## 2011-11-06 NOTE — Telephone Encounter (Signed)
Refill request from [facility] for Clonidine 0.1mg , one tab daily and Lisinopril 40mg , one tab daily.  These are on pt med list as historical on, we have not filled them in [facility].  Clonidine was not on pt med list in [facility] and Lisinopril 20-12.5 was last filled in [facility] 09/05/10, #90 with 1 refill Please advise

## 2011-11-06 NOTE — Telephone Encounter (Signed)
Okay to refill? 

## 2011-11-10 ENCOUNTER — Telehealth: Payer: Self-pay | Admitting: Family Medicine

## 2011-11-10 ENCOUNTER — Other Ambulatory Visit: Payer: Self-pay | Admitting: Family Medicine

## 2011-11-10 MED ORDER — HYDROCODONE-ACETAMINOPHEN 10-325 MG PO TABS: 1.0000 | ORAL_TABLET | Freq: Four times a day (QID) | ORAL | Status: DC | PRN

## 2011-11-10 NOTE — Telephone Encounter (Signed)
Jo Alvarez at [facility] requested Rx be faxed to [facility], fax [phone].  They will get RX to the pharmacy they use

## 2011-11-10 NOTE — Telephone Encounter (Signed)
Refill for 3 months. 

## 2011-11-10 NOTE — Telephone Encounter (Signed)
Last filled 08/04/11, #120 with 2 refills, one tab every 6 hours prn pain

## 2011-11-10 NOTE — Telephone Encounter (Signed)
Jo Alvarez  place called to request refill on HYDROcodone-acetaminophen (NORCO) 10-325 MG per tablet for pt, pt is out.

## 2011-11-10 NOTE — Telephone Encounter (Signed)
Called about refill and requested rx to be re-faxed to different number. Attempted to fax 2 times but would not go through.

## 2011-11-13 ENCOUNTER — Other Ambulatory Visit: Payer: Self-pay | Admitting: *Deleted

## 2011-11-13 MED ORDER — GABAPENTIN 100 MG PO CAPS: 200.0000 mg | ORAL_CAPSULE | Freq: Two times a day (BID) | ORAL | Status: DC

## 2011-11-13 MED ORDER — PREDNISONE 10 MG PO TABS: 10.0000 mg | ORAL_TABLET | Freq: Every day | ORAL | Status: DC

## 2011-11-14 ENCOUNTER — Other Ambulatory Visit: Payer: Self-pay | Admitting: *Deleted

## 2011-11-17 ENCOUNTER — Other Ambulatory Visit: Payer: Self-pay | Admitting: *Deleted

## 2011-11-17 MED ORDER — SENNOSIDES 8.6 MG PO TABS: 1.0000 | ORAL_TABLET | Freq: Every day | ORAL | Status: DC

## 2011-11-21 ENCOUNTER — Encounter: Payer: Self-pay | Admitting: Family Medicine

## 2011-11-26 ENCOUNTER — Encounter (HOSPITAL_COMMUNITY): Payer: Self-pay | Admitting: Emergency Medicine

## 2011-11-26 ENCOUNTER — Emergency Department (HOSPITAL_COMMUNITY)
Admission: EM | Admit: 2011-11-26 | Discharge: 2011-11-26 | Disposition: A | Discharge status: Home / Self Care | Payer: Medicare Other | Attending: Emergency Medicine | Admitting: Emergency Medicine

## 2011-11-26 ENCOUNTER — Encounter: Payer: Self-pay | Admitting: Family Medicine

## 2011-11-26 DIAGNOSIS — I10 Essential (primary) hypertension: Secondary | ICD-10-CM | POA: Insufficient documentation

## 2011-11-26 DIAGNOSIS — I499 Cardiac arrhythmia, unspecified: Secondary | ICD-10-CM | POA: Insufficient documentation

## 2011-11-26 DIAGNOSIS — Z85828 Personal history of other malignant neoplasm of skin: Secondary | ICD-10-CM | POA: Insufficient documentation

## 2011-11-26 DIAGNOSIS — R197 Diarrhea, unspecified: Secondary | ICD-10-CM | POA: Insufficient documentation

## 2011-11-26 DIAGNOSIS — G6189 Other inflammatory polyneuropathies: Secondary | ICD-10-CM | POA: Insufficient documentation

## 2011-11-26 DIAGNOSIS — I059 Rheumatic mitral valve disease, unspecified: Secondary | ICD-10-CM | POA: Insufficient documentation

## 2011-11-26 DIAGNOSIS — R112 Nausea with vomiting, unspecified: Secondary | ICD-10-CM | POA: Insufficient documentation

## 2011-11-26 DIAGNOSIS — F3289 Other specified depressive episodes: Secondary | ICD-10-CM | POA: Insufficient documentation

## 2011-11-26 DIAGNOSIS — Z87440 Personal history of urinary (tract) infections: Secondary | ICD-10-CM | POA: Insufficient documentation

## 2011-11-26 DIAGNOSIS — F329 Major depressive disorder, single episode, unspecified: Secondary | ICD-10-CM | POA: Insufficient documentation

## 2011-11-26 LAB — POCT I-STAT, CHEM 8
Calcium, Ion: 1.05 mmol/L — ABNORMAL LOW (ref 1.12–1.32)
Chloride: 105 mEq/L (ref 96–112)
HCT: 38 % (ref 36.0–46.0)
Sodium: 138 mEq/L (ref 135–145)
TCO2: 24 mmol/L (ref 0–100)

## 2011-11-26 MED ORDER — LOPERAMIDE HCL 2 MG PO CAPS
2.0000 mg | ORAL_CAPSULE | Freq: Once | ORAL | Status: AC
  Administered 2011-11-26: 2 mg via ORAL
  Filled 2011-11-26: qty 1

## 2011-11-26 MED ORDER — ONDANSETRON HCL 4 MG/2ML IJ SOLN
INTRAMUSCULAR | Status: AC
  Administered 2011-11-26: 4 mg via INTRAVENOUS
  Filled 2011-11-26: qty 2

## 2011-11-26 MED ORDER — METOCLOPRAMIDE HCL 5 MG/ML IJ SOLN
5.0000 mg | Freq: Once | INTRAMUSCULAR | Status: AC
  Administered 2011-11-26: 5 mg via INTRAVENOUS
  Filled 2011-11-26: qty 2

## 2011-11-26 MED ORDER — ONDANSETRON HCL 4 MG/2ML IJ SOLN
INTRAMUSCULAR | Status: AC
  Filled 2011-11-26: qty 2

## 2011-11-26 MED ORDER — ONDANSETRON 8 MG PO TBDP: 8.0000 mg | ORAL_TABLET | Freq: Three times a day (TID) | ORAL | Status: AC | PRN

## 2011-11-26 MED ORDER — ONDANSETRON HCL 4 MG/2ML IJ SOLN
4.0000 mg | Freq: Once | INTRAMUSCULAR | Status: AC
  Administered 2011-11-26: 4 mg via INTRAVENOUS
  Filled 2011-11-26: qty 2

## 2011-11-26 MED ORDER — LOPERAMIDE HCL 2 MG PO CAPS: 2.0000 mg | ORAL_CAPSULE | Freq: Four times a day (QID) | ORAL | Status: AC | PRN

## 2011-11-26 MED ORDER — SODIUM CHLORIDE 0.9 % IV BOLUS (SEPSIS)
500.0000 mL | Freq: Once | INTRAVENOUS | Status: AC
  Administered 2011-11-26: 500 mL via INTRAVENOUS

## 2011-11-26 MED ORDER — ONDANSETRON HCL 4 MG/2ML IJ SOLN
4.0000 mg | Freq: Once | INTRAMUSCULAR | Status: AC
  Administered 2011-11-26 (×2): 4 mg via INTRAVENOUS

## 2011-11-26 MED ORDER — SODIUM CHLORIDE 0.9 % IV SOLN
Freq: Once | INTRAVENOUS | Status: AC
  Administered 2011-11-26: 16:00:00 via INTRAVENOUS

## 2011-11-26 NOTE — ED Provider Notes (Signed)
History     CSN: [id number]  Arrival date & time 11/26/11  1409   First MD Initiated Contact with Patient 11/26/11 1423      Chief Complaint  Patient presents with  . Diarrhea  . Emesis    (Consider location/radiation/quality/duration/timing/severity/associated sxs/prior treatment) HPI Comments: Patient presents with N/V/D x 2 days.  Patient lives in [place] where several other residents are sick with the same symptoms.  Pt denies fevers, abdominal pain, bloody stool or emesis.    Patient is a 78 y.o. female presenting with diarrhea and vomiting. The history is provided by the patient.  Diarrhea The primary symptoms include nausea, vomiting and diarrhea. Primary symptoms do not include fever or abdominal pain.  Emesis  Associated symptoms include diarrhea. Pertinent negatives include no abdominal pain and no fever.    Past Medical History  Diagnosis Date  . HYPONATREMIA 01/01/2009  . ANEMIA 08/20/2009  . LEUKOCYTOSIS 08/20/2009  . DEPRESSION, MAJOR, RECURRENT 02/01/2009  . Unspecified hearing loss 11/17/2008  . HYPERTENSION 11/17/2008  . TEMPORAL ARTERITIS 08/20/2009  . PYELONEPHRITIS 09/10/2009  . Cellulitis and abscess of upper arm and forearm 04/16/2010  . PRESSURE ULCER BUTTOCK 02/01/2009  . BACK PAIN, LUMBAR 07/29/2010  . WEAKNESS 01/01/2009  . Anorexia 12/19/2008  . Headache 04/18/2010  . DIARRHEA, CHRONIC 12/19/2008  . Stress incontinence   . Hx: UTI (urinary tract infection)   . Blood transfusion   . Foot drop, bilateral   . Clostridium difficile colitis     History of  . Pancreatitis     History of  . Anxiety   . DEGENERATIVE JOINT DISEASE 11/17/2008    shoulders, arms  . History of skin cancer   . Dysrhythmia     Hx of paroxysmal a-fib  . H/O psoriatic arthritis   . History of SIADH   . H/O hypokalemia   . H/O mitral valve prolapse   . 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  Cholecystectomy   . Abdominal hysterectomy   . Lumbar laminectomy   . Temporal artery biopsy / ligation 06/04/2009  . Right cataract extraction     Family History  Problem Relation Age of Onset  . Cancer Neg Hx     family  . Hypertension Neg Hx     family  . Arthritis Neg Hx     family    History  Substance Use Topics  . Smoking status: Never Smoker   . Smokeless tobacco: Never Used  . Alcohol Use: No    OB History    Grav Para Term Preterm Abortions TAB SAB Ect Mult Living                  Review of Systems  Constitutional: Negative for fever.  Respiratory: Negative for shortness of breath.   Cardiovascular: Negative for chest pain.  Gastrointestinal: Positive for nausea, vomiting and diarrhea. Negative for abdominal pain, blood in stool and abdominal distention.  Genitourinary: Negative for decreased urine volume.  All other systems reviewed and are negative.    Allergies  Benazepril hcl; Epinephrine hcl; and Lidocaine  Home Medications   Current Outpatient Rx  Name Route Sig Dispense Refill  . AMLODIPINE BESYLATE 10 MG PO TABS  TAKE 1 TABLET EVERY DAY 30 tablet 11  . CALCIUM-VITAMIN D 500-200 MG-UNIT PO TABS Oral Take 1 tablet by mouth 2 (two) times daily with a meal. 60 tablet  11  . CLONIDINE HCL 0.1 MG PO TABS Oral Take 1 tablet (0.1 mg total) by mouth daily. 90 tablet 3  . DIAZEPAM 5 MG PO TABS Oral Take 5 mg by mouth every 12 (twelve) hours as needed. anxiety    . GABAPENTIN 100 MG PO CAPS Oral Take 2 capsules (200 mg total) by mouth 2 (two) times daily. 2 tabs in the AM and 2 tabs in the PM 120 capsule 11  . HYDROCODONE-ACETAMINOPHEN 10-325 MG PO TABS Oral Take 1 tablet by mouth every 6 (six) hours as needed for pain. 120 tablet 2  . LEFLUNOMIDE 10 MG PO TABS  1 and 1/2 tab daily 45 tablet 5  . LISINOPRIL 40 MG PO TABS Oral Take 1 tablet (40 mg total) by mouth daily. 90 tablet 3  . [facility] SPECTRAVITE PO Oral Take by mouth daily.      Marland Kitchen PRESERVISION AREDS 2  PO Oral Take 1 tablet by mouth 2 (two) times daily.    Marland Kitchen PREDNISONE 10 MG PO TABS Oral Take 1 tablet (10 mg total) by mouth daily. 30 tablet 11  . RANITIDINE HCL 150 MG PO TABS      . SENNOSIDES 8.6 MG PO TABS Oral Take 1 tablet (8.6 mg total) by mouth daily. 30 tablet 11  . SERTRALINE HCL 50 MG PO TABS Oral Take 25 mg by mouth daily. Take 1 and 1/2 tab daily (total 75 mg)    . BAZA PROTECT EX CREA Topical Apply 1 application topically daily as needed.       BP 160/68  Pulse 96  Temp(Src) 97.8 F (36.6 C) (Oral)  Resp 14  SpO2 98%  Physical Exam  Nursing note and vitals reviewed. Constitutional: She is oriented to person, place, and time. She appears well-developed and well-nourished.  HENT:  Head: Normocephalic and atraumatic.  Neck: Neck supple.  Cardiovascular: Normal rate and normal heart sounds.  A regularly irregular rhythm present.  Pulmonary/Chest: Breath sounds normal. No respiratory distress. She has no wheezes. She has no rales. She exhibits no tenderness.  Abdominal: Soft. Bowel sounds are normal. She exhibits no distension and no mass. There is no rebound and no guarding.    Neurological: She is alert and oriented to person, place, and time.  Psychiatric: She has a normal mood and affect. Her behavior is normal. Judgment and thought content normal.    ED Course  Procedures (including critical care time)  Labs Reviewed  POCT I-STAT, CHEM 8 - Abnormal; Notable for the following:    BUN 27 (*)    Glucose, Bld 121 (*)    Calcium, Ion 1.05 (*)    All other components within normal limits   No results found.  2:36 PM Discussed patient with Dr Weldon Inches. Plan is for PO trial and d/c home.  5:11 PM Patient has been sleeping, has gotten nauseated and not yet tolerating PO.  Son is now bedside.  Patient has had two episodes of diarrhea since she arrived.  Son states she is mildly disoriented.  IV not running well, nurse to establish new IV.    6:52 PM Patient reports  she continues to be nauseated.  No further episodes of vomiting or diarrhea.  Patient receiving IVF now, is also more alert.    7:27 PM No further vomiting or diarrhea.  Pt with continued nausea.    8:04 PM Patient is tolerating fluids.  Plan is for d/c home.    1. Nausea vomiting and diarrhea  MDM  Patient from assisted living with N/V/D, with sick contacts with similar symptoms.  Patient given IVF in ED, medication for symptoms, pt tolerating PO in ED.  D/C back to assisted living, to return for worsening condition.  Return precautions given.  Patient verbalizes understanding and agrees with plan.          Rise Patience, [place] 11/26/11 2006

## 2011-11-26 NOTE — ED Notes (Signed)
Called [facility] for transport. Ginette Otto is unavailable to be reached at this time after multiple attempts. NS continues to try and contact.

## 2011-11-26 NOTE — ED Notes (Signed)
EAV:[id number]> Expected date:11/26/11<BR> Expected time:<BR> Means of arrival:<BR> Comments:<BR> EMS 10 GC - n/v/d

## 2011-11-26 NOTE — ED Notes (Signed)
EMS brings pt from [facility], pt c/o of N/V/D x 2 days, no pain. EMS gave 4mg  IV zofran in route to hospital

## 2011-11-26 NOTE — Discharge Instructions (Signed)
Please read the information below.  Use the prescribed medication to help control your vomiting and diarrhea.  Drink plenty of fluids over the next few days.  Return to the ER if you are unable to tolerate fluids by mouth, are having uncontrolled vomiting and diarrhea, or feel that you are getting dehydrated (dry mouth, decreased urination).  You may return to the ER at any time for worsening condition or any new symptoms that concern you.   Diet for Diarrhea, Adult Having frequent, runny stools (diarrhea) has many causes. Diarrhea may be caused or worsened by food or drink. Diarrhea may be relieved by changing your diet. IF YOU ARE NOT TOLERATING SOLID FOODS:  Drink enough water and fluids to keep your urine clear or pale yellow.   Avoid sugary drinks and sodas as well as milk-based beverages.   Avoid beverages containing caffeine and alcohol.   You may try rehydrating beverages. You can make your own by following this recipe:    tsp table salt.    tsp baking soda.   ? tsp salt substitute (potassium chloride).   1 tbs + 1 tsp sugar.   1 qt water.  As your stools become more solid, you can start eating solid foods. Add foods one at a time. If a certain food causes your diarrhea to get worse, avoid that food and try other foods. A low fiber, low-fat, and lactose-free diet is recommended. Small, frequent meals may be better tolerated.  Starches  Allowed:  White, [place], and pita breads, plain rolls, buns, bagels. Plain muffins, matzo. Soda, saltine, or graham crackers. Pretzels, melba toast, zwieback. Cooked cereals made with water: cornmeal, farina, cream cereals. Dry cereals: refined corn, wheat, rice. Potatoes prepared any way without skins, refined macaroni, spaghetti, noodles, refined rice.   Avoid:  Bread, rolls, or crackers made with whole wheat, multi-grains, rye, bran seeds, nuts, or coconut. Corn tortillas or taco shells. Cereals containing whole grains, multi-grains, bran,  coconut, nuts, or raisins. Cooked or dry oatmeal. Coarse wheat cereals, granola. Cereals advertised as "high-fiber." Potato skins. Whole grain pasta, wild or brown rice. Popcorn. Sweet potatoes/yams. Sweet rolls, doughnuts, waffles, pancakes, sweet breads.  Vegetables  Allowed: Strained tomato and vegetable juices. Most well-cooked and canned vegetables without seeds. Fresh: Tender lettuce, cucumber without the skin, cabbage, spinach, bean sprouts.   Avoid: Fresh, cooked, or canned: Artichokes, baked beans, beet greens, broccoli, Brussels sprouts, corn, kale, legumes, peas, sweet potatoes. Cooked: Green or red cabbage, spinach. Avoid large servings of any vegetables, because vegetables shrink when cooked, and they contain more fiber per serving than fresh vegetables.  Fruit  Allowed: All fruit juices except prune juice. Cooked or canned: Apricots, applesauce, cantaloupe, cherries, fruit cocktail, grapefruit, grapes, kiwi, mandarin oranges, peaches, pears, plums, watermelon. Fresh: Apples without skin, ripe banana, grapes, cantaloupe, cherries, grapefruit, peaches, oranges, plums. Keep servings limited to  cup or 1 piece.   Avoid: Fresh: Apple with skin, apricots, mango, pears, raspberries, strawberries. Prune juice, stewed or dried prunes. Dried fruits, raisins, dates. Large servings of all fresh fruits.  Meat and Meat Substitutes  Allowed: Ground or well-cooked tender beef, ham, veal, lamb, pork, or poultry. Eggs, plain cheese. Fish, oysters, shrimp, lobster, other seafoods. Liver, organ meats.   Avoid: Tough, fibrous meats with gristle. Peanut butter, smooth or chunky. Cheese, nuts, seeds, legumes, dried peas, beans, lentils.  Milk  Allowed: Yogurt, lactose-free milk, kefir, drinkable yogurt, buttermilk, soy milk.   Avoid: Milk, chocolate milk, beverages made with milk, such as  milk shakes.  Soups  Allowed: Bouillon, broth, or soups made from allowed foods. Any strained soup.   Avoid:  Soups made from vegetables that are not allowed, cream or milk-based soups.  Desserts and Sweets  Allowed: Sugar-free gelatin, sugar-free frozen ice pops made without sugar alcohol.   Avoid: Plain cakes and cookies, pie made with allowed fruit, pudding, custard, cream pie. Gelatin, fruit, ice, sherbet, frozen ice pops. Ice cream, ice milk without nuts. Plain hard candy, honey, jelly, molasses, syrup, sugar, chocolate syrup, gumdrops, marshmallows.  Fats and Oils  Allowed: Avoid any fats and oils.   Avoid: Seeds, nuts, olives, avocados. Margarine, butter, cream, mayonnaise, salad oils, plain salad dressings made from allowed foods. Plain gravy, crisp bacon without rind.  Beverages  Allowed: Water, decaffeinated teas, oral rehydration solutions, sugar-free beverages.   Avoid: Fruit juices, caffeinated beverages (coffee, tea, soda or pop), alcohol, sports drinks, or lemon-lime soda or pop.  Condiments  Allowed: Ketchup, mustard, horseradish, vinegar, cream sauce, cheese sauce, cocoa powder. Spices in moderation: allspice, basil, bay leaves, celery powder or leaves, cinnamon, cumin powder, curry powder, ginger, mace, marjoram, onion or garlic powder, oregano, paprika, parsley flakes, ground pepper, rosemary, sage, savory, tarragon, thyme, turmeric.   Avoid: Coconut, honey.  Weight Monitoring: Weigh yourself every day. You should weigh yourself in the morning after you urinate and before you eat breakfast. Wear the same amount of clothing when you weigh yourself. Record your weight daily. Bring your recorded weights to your clinic visits. Tell your caregiver right away if you have gained 3 lb/1.4 kg or more in 1 day, 5 lb/2.3 kg in a week, or whatever amount you were told to report. SEEK IMMEDIATE MEDICAL CARE IF:   You are unable to keep fluids down.   You start to throw up (vomit) or diarrhea keeps coming back (persistent).   Abdominal pain develops, increases, or can be felt in one place  (localizes).   You have an oral temperature above 102 F (38.9 C), not controlled by medicine.   Diarrhea contains blood or mucus.   You develop excessive weakness, dizziness, fainting, or extreme thirst.  MAKE SURE YOU:   Understand these instructions.   Will watch your condition.   Will get help right away if you are not doing well or get worse.  Document Released: 11/08/2003 Document Revised: 08/07/2011 Document Reviewed: 03/01/2009 [facility] Patient Information 2012 [place], [place].

## 2011-11-26 NOTE — ED Provider Notes (Signed)
Medical screening examination/treatment/procedure(s) were conducted as a shared visit with non-physician practitioner(s) and myself.  I personally evaluated the patient during the encounter 35 y female with n/v/d for 2 d. No pain, fever, recent abx use, or rash.   Lives in [place] where noravirus has caused outbreak of diarrhea.   sxs resolved now after tx in ed. Chem no severe dehydration .  Will give po fluids. If keeps it down can go back to [facility].    Cheri Guppy, MD 11/26/11 [phone]

## 2011-11-27 ENCOUNTER — Other Ambulatory Visit: Payer: Self-pay | Admitting: Family Medicine

## 2011-11-27 MED ORDER — DOCUSATE SODIUM 100 MG PO CAPS: 100.0000 mg | ORAL_CAPSULE | Freq: Two times a day (BID) | ORAL | Status: DC

## 2011-11-28 NOTE — ED Provider Notes (Signed)
Medical screening examination/treatment/procedure(s) were performed by non-physician practitioner and as supervising physician I was immediately available for consultation/collaboration.  Cheyane Ayon, MD 11/28/11 1506 

## 2011-12-01 ENCOUNTER — Telehealth: Payer: Self-pay | Admitting: *Deleted

## 2011-12-01 NOTE — Telephone Encounter (Signed)
Received fax from [facility] requesting clarification on Norco.  On 10-06-2011 Dr [name] had indicated this med should not be PRN, however the Rx faxed on 11-10-11 had still as a PRN.  Will correct and fax again

## 2011-12-02 NOTE — Telephone Encounter (Signed)
Faxed corrected Rx, confirmation received.  Took PRN off Norco sig. in pt chart

## 2011-12-06 ENCOUNTER — Emergency Department (HOSPITAL_COMMUNITY)
Admission: EM | Admit: 2011-12-06 | Discharge: 2011-12-06 | Disposition: A | Discharge status: Rehabilitation Facility | Payer: Medicare Other | Attending: Emergency Medicine | Admitting: Emergency Medicine

## 2011-12-06 ENCOUNTER — Encounter (HOSPITAL_COMMUNITY): Payer: Self-pay | Admitting: Emergency Medicine

## 2011-12-06 DIAGNOSIS — I059 Rheumatic mitral valve disease, unspecified: Secondary | ICD-10-CM | POA: Insufficient documentation

## 2011-12-06 DIAGNOSIS — Z85828 Personal history of other malignant neoplasm of skin: Secondary | ICD-10-CM | POA: Insufficient documentation

## 2011-12-06 DIAGNOSIS — D649 Anemia, unspecified: Secondary | ICD-10-CM | POA: Insufficient documentation

## 2011-12-06 DIAGNOSIS — K5641 Fecal impaction: Secondary | ICD-10-CM

## 2011-12-06 DIAGNOSIS — I10 Essential (primary) hypertension: Secondary | ICD-10-CM | POA: Insufficient documentation

## 2011-12-06 MED ORDER — POLYETHYLENE GLYCOL 3350 17 GM/SCOOP PO POWD: 17.0000 g | Freq: Every day | ORAL | Status: AC

## 2011-12-06 NOTE — ED Provider Notes (Signed)
History     CSN: [id number]  Arrival date & time 12/06/11  1003   First MD Initiated Contact with Patient 12/06/11 1010      Chief Complaint  Patient presents with  . Constipation    (Consider location/radiation/quality/duration/timing/severity/associated sxs/prior treatment) The history is provided by the patient and the nursing home.   the patient is currently reporting constipation x1 week and a sense of needing to have a bowel movement but is unable to.  The patient is on when necessary Vicodin for a right lower extremity fracture.  She is taking stool softeners without success.  She was sent to the ER from the nursing home for evaluation for constipation.  The patient denies blood in in her stool.  She reports a sense of needing to have a bowel movement but when she strains she is unable to pass stool and reports some rectal prolapse.  She denies nausea and vomiting.  She denies abdominal pain or abdominal distention.  She has no other complaints.  Her symptoms are mild to moderate in severity  Past Medical History  Diagnosis Date  . HYPONATREMIA 01/01/2009  . ANEMIA 08/20/2009  . LEUKOCYTOSIS 08/20/2009  . DEPRESSION, MAJOR, RECURRENT 02/01/2009  . Unspecified hearing loss 11/17/2008  . HYPERTENSION 11/17/2008  . TEMPORAL ARTERITIS 08/20/2009  . PYELONEPHRITIS 09/10/2009  . Cellulitis and abscess of upper arm and forearm 04/16/2010  . PRESSURE ULCER BUTTOCK 02/01/2009  . BACK PAIN, LUMBAR 07/29/2010  . WEAKNESS 01/01/2009  . Anorexia 12/19/2008  . Headache 04/18/2010  . DIARRHEA, CHRONIC 12/19/2008  . Stress incontinence   . Hx: UTI (urinary tract infection)   . Blood transfusion   . Foot drop, bilateral   . Clostridium difficile colitis     History of  . Pancreatitis     History of  . Anxiety   . DEGENERATIVE JOINT DISEASE 11/17/2008    shoulders, arms  . History of skin cancer   . Dysrhythmia     Hx of paroxysmal a-fib  . H/O psoriatic arthritis   . History of SIADH   . H/O  hypokalemia   . H/O mitral valve prolapse   . 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 Cholecystectomy   . Abdominal hysterectomy   . Lumbar laminectomy   . Temporal artery biopsy / ligation 06/04/2009  . Right cataract extraction     Family History  Problem Relation Age of Onset  . Cancer Neg Hx     family  . Hypertension Neg Hx     family  . Arthritis Neg Hx     family    History  Substance Use Topics  . Smoking status: Never Smoker   . Smokeless tobacco: Never Used  . Alcohol Use: No    OB History    Grav Para Term Preterm Abortions TAB SAB Ect Mult Living                  Review of Systems  Gastrointestinal: Positive for constipation.  All other systems reviewed and are negative.    Allergies  Benazepril hcl; Epinephrine hcl; and Lidocaine  Home Medications   Current Outpatient Rx  Name Route Sig Dispense Refill  . AMLODIPINE BESYLATE 10 MG PO TABS  TAKE 1 TABLET EVERY DAY 30 tablet 11  . CALCIUM-VITAMIN D 500-200 MG-UNIT PO TABS Oral Take 1 tablet by mouth 2 (two) times daily with a meal.  60 tablet 11  . CLONIDINE HCL 0.1 MG PO TABS Oral Take 1 tablet (0.1 mg total) by mouth daily. 90 tablet 3  . DIAZEPAM 5 MG PO TABS Oral Take 5 mg by mouth every 12 (twelve) hours as needed. anxiety    . DOCUSATE SODIUM 100 MG PO CAPS Oral Take 1 capsule (100 mg total) by mouth every 12 (twelve) hours. 60 capsule 0  . DOCUSATE SODIUM 100 MG PO CAPS Oral Take 1 capsule (100 mg total) by mouth 2 (two) times daily. 60 capsule 11  . GABAPENTIN 100 MG PO CAPS Oral Take 2 capsules (200 mg total) by mouth 2 (two) times daily. 2 tabs in the AM and 2 tabs in the PM 120 capsule 11  . HYDROCODONE-ACETAMINOPHEN 10-325 MG PO TABS Oral Take 1 tablet by mouth every 6 (six) hours.    . LEFLUNOMIDE 10 MG PO TABS Oral Take 15 mg by mouth daily.    Marland Kitchen LISINOPRIL 40 MG PO TABS Oral Take 1 tablet (40 mg total) by mouth  daily. 90 tablet 3  . LOPERAMIDE HCL 2 MG PO CAPS Oral Take 1 capsule (2 mg total) by mouth 4 (four) times daily as needed for diarrhea or loose stools. 20 capsule 0  . ADULT MULTIVITAMIN W/MINERALS CH Oral Take 1 tablet by mouth daily.    Marland Kitchen PRESERVISION AREDS 2 PO Oral Take 2 capsules by mouth 2 (two) times daily.     Marland Kitchen POLYETHYLENE GLYCOL 3350 PO POWD Oral Take 17 g by mouth daily. 255 g 0  . PREDNISONE 10 MG PO TABS Oral Take 1 tablet (10 mg total) by mouth daily. 30 tablet 11  . RANITIDINE HCL 150 MG PO TABS Oral Take 150 mg by mouth 2 (two) times daily as needed. For reflux.    . SENNOSIDES 8.6 MG PO TABS Oral Take 1 tablet (8.6 mg total) by mouth daily. 30 tablet 11  . SERTRALINE HCL 50 MG PO TABS Oral Take 75 mg by mouth daily.     Marland Kitchen BAZA PROTECT EX CREA Topical Apply 1 application topically 3 (three) times daily as needed. For redness on buttocks.      BP 118/63  Pulse 80  Temp 98.4 F (36.9 C)  Resp 20  SpO2 97%  Physical Exam  Nursing note and vitals reviewed. Constitutional: She is oriented to person, place, and time. She appears well-developed and well-nourished. No distress.  HENT:  Head: Normocephalic and atraumatic.  Eyes: EOM are normal.  Neck: Normal range of motion.  Cardiovascular: Normal rate, regular rhythm and normal heart sounds.   Pulmonary/Chest: Effort normal and breath sounds normal.  Abdominal: Soft. She exhibits no distension. There is no tenderness.  Genitourinary:       Normal appearing external anus.  On digital rectal exam multiple hard round stool balls are noted.  She has no gross blood.  She has no evidence of rectal prolapse.  The patient was disimpacted at the bedside with significant improvement in her symptoms  Musculoskeletal: Normal range of motion.  Neurological: She is alert and oriented to person, place, and time.  Skin: Skin is warm and dry.  Psychiatric: She has a normal mood and affect. Judgment normal.    ED Course  Fecal  disimpaction Date/Time: 12/06/2011 10:40 AM Performed by: Lyanne Co Authorized by: Lyanne Co Consent: Verbal consent obtained. Risks and benefits: risks, benefits and alternatives were discussed Consent given by: patient Required items: required blood products, implants, devices, and  special equipment available Patient identity confirmed: verbally with patient Time out: Immediately prior to procedure a "time out" was called to verify the correct patient, procedure, equipment, support staff and site/side marked as required. Local anesthesia used: no Patient sedated: no Patient tolerance: Patient tolerated the procedure well with no immediate complications. Comments: Large amount of stool removed.      Labs Reviewed - No data to display No results found.   1. Fecal impaction       MDM  Fecal disimpaction relieved at the bedside.  The patient will be discharged home on MiraLAX.  Close followup with PCP recommended.  Her abdomen is benign.        Lyanne Co, MD 12/06/11 [phone]

## 2011-12-06 NOTE — ED Notes (Signed)
[id number]<AV> Expected date:12/06/11<BR> Expected time:10:01 AM<BR> Means of arrival:Ambulance<BR> Comments:<BR> EMS-Constipation

## 2011-12-06 NOTE — ED Notes (Signed)
Per EMS: Pt coming from facility c/o constipation x 1 wk.  Prior to that, pt had norovirus. Has been taking stool softeners and laxatives with no relief.  No abdominal distention or pain.

## 2011-12-06 NOTE — Discharge Instructions (Signed)
Fecal Impaction A fecal impaction happens when there is a large, firm amount of stool (poop) that cannot be passed. The impacted stool is usually in the rectum, which is the lowest part of the large bowel. The impacted stool can block the colon and cause significant problems. CAUSES  The longer stool stays in the rectum, the harder it gets. Anything that slows down your bowel movements can lead to fecal impaction. These conditions include:  Constipation (having firm hard stools). This can be a longstanding (chronic) problem, or can happen suddenly (acutely).   Painful conditions of the rectum, such as hemorrhoids or anal fissures. The pain of these conditions can make you try to avoid having bowel movements.   Narcotic pain medications cause constipation, which can sometimes be severe. If you take narcotic pain medication, you should also talk with your caregiver about preventing constipation.   Not getting enough fluids.   Inactivity and bed rest over long periods of time.  SYMPTOMS  Some symptoms of fecal impaction include:  Lack of normal bowel movements or changes in bowel patterns.   Sense of fullness in the rectum, but unable to pass stool.   Pain or cramps in the stomach or abdominal area (often after meals).   Thin, watery discharge from the rectum.  Without treatment, a fecal impaction can block the colon and cause severe abdominal pain or colon tears (perforation). This may lead to surgery.  DIAGNOSIS  Fecal impaction is suspected based upon your symptoms and upon a physical examination. This will include an exam of your rectum, which can confirm the diagnosis. Sometimes x-rays or lab testing may be needed to confirm the diagnosis and be sure there are no other problems.  TREATMENT   Initially an impaction can be removed manually. Your caregiver, using a gloved finger, can remove hard stool from your rectum.   Medication is sometimes needed. A suppository or enema can be  given in the rectum to soften the stool. This can stimulate a bowel movement. Medicines can also be given by mouth (orally).   Surgery may be needed if the colon has torn (perforated) due to blockage. This is very rare.  HOME CARE INSTRUCTIONS   Develop regular bowel habits. This may be something such as getting in the habit of having a bowel movement after your morning cup of coffee or after eating. Be sure to allow yourself enough time on the toilet.   Maintain a high fiber diet.   Drink plenty of fluids each day. This is especially true for the elderly and especially during cold weather when thirst may not be as strong. Try to take in at least eight, 8 ounce glasses of water daily unless instructed otherwise.   Exercise regularly.   If you begin to get constipated, increase the amount of fiber in your diet. Eat plenty of fruits, vegetables, whole wheat breads, bran, oatmeal and similar products.   Natural fiber laxatives such as Metamucil are also very helpful.   Speak with your caregiver if you suspect medications may be causing constipation.  SEEK MEDICAL CARE IF:   You have ongoing constipation or a hard time passing your stools.   You have ongoing rectal pain.   You require enemas or suppositories more than twice a week.  SEEK IMMEDIATE MEDICAL CARE IF:   There are continued problems or you develop abdominal pain.   You develop rectal bleeding.   You develop black or tarry stools or feel lightheaded.  MAKE SURE YOU:     Understand these instructions.   Will watch your condition.   Will get help right away if you are not doing well or get worse.  Document Released: 05/10/2004 Document Revised: 08/07/2011 Document Reviewed: 04/27/2008 [employer] Patient Information 2012 [employer]. 

## 2011-12-06 NOTE — ED Notes (Signed)
Pt states that she hasn't had a bowel movement in 10 days.  She has taken prune juice, stool softeners, and laxatives with no relief.

## 2011-12-23 ENCOUNTER — Other Ambulatory Visit: Payer: Self-pay | Admitting: *Deleted

## 2011-12-23 MED ORDER — SERTRALINE HCL 50 MG PO TABS: 75.0000 mg | ORAL_TABLET | Freq: Every day | ORAL | Status: DC

## 2011-12-24 ENCOUNTER — Encounter: Payer: Self-pay | Admitting: Family Medicine

## 2011-12-25 ENCOUNTER — Other Ambulatory Visit: Payer: Self-pay | Admitting: *Deleted

## 2011-12-25 ENCOUNTER — Other Ambulatory Visit: Payer: Self-pay

## 2011-12-25 MED ORDER — ADULT MULTIVITAMIN W/MINERALS CH: 1.0000 | ORAL_TABLET | Freq: Every day | ORAL | Status: DC

## 2012-01-09 ENCOUNTER — Telehealth: Payer: Self-pay | Admitting: Family Medicine

## 2012-01-09 ENCOUNTER — Telehealth: Payer: Self-pay | Admitting: *Deleted

## 2012-01-09 NOTE — Telephone Encounter (Signed)
Spoke with [employer]

## 2012-01-09 NOTE — Telephone Encounter (Signed)
Percocet 7.5/325 mg one po q 6 hours disp #120 with no refills

## 2012-01-09 NOTE — Telephone Encounter (Signed)
Patient's pharmacy [facility] called stating that they received an order for percocet from [facility] but no quantity is specified. Please advise.  [phone]

## 2012-01-09 NOTE — Telephone Encounter (Signed)
Received a faxed note from [facility].  Norco every 6 hours no longer helping her pain level.  Per Dr [name], D/C Norco, Start Percocet 7.5-325, one tab by mouth every 6 hours for pain.  Note faxed back to [place], med ordered

## 2012-01-21 ENCOUNTER — Encounter (HOSPITAL_COMMUNITY): Payer: Self-pay | Admitting: Emergency Medicine

## 2012-01-21 ENCOUNTER — Emergency Department (HOSPITAL_COMMUNITY)
Admission: EM | Admit: 2012-01-21 | Discharge: 2012-01-21 | Disposition: A | Discharge status: Home / Self Care | Payer: Medicare Other | Attending: Emergency Medicine | Admitting: Emergency Medicine

## 2012-01-21 ENCOUNTER — Emergency Department (HOSPITAL_COMMUNITY): Payer: Medicare Other

## 2012-01-21 DIAGNOSIS — Z79899 Other long term (current) drug therapy: Secondary | ICD-10-CM | POA: Insufficient documentation

## 2012-01-21 DIAGNOSIS — R0789 Other chest pain: Secondary | ICD-10-CM | POA: Insufficient documentation

## 2012-01-21 DIAGNOSIS — I10 Essential (primary) hypertension: Secondary | ICD-10-CM | POA: Insufficient documentation

## 2012-01-21 DIAGNOSIS — Z9889 Other specified postprocedural states: Secondary | ICD-10-CM | POA: Insufficient documentation

## 2012-01-21 LAB — CBC
HCT: 31 % — ABNORMAL LOW (ref 36.0–46.0)
Hemoglobin: 10.2 g/dL — ABNORMAL LOW (ref 12.0–15.0)
MCV: 90.1 fL (ref 78.0–100.0)
WBC: 8.3 10*3/uL (ref 4.0–10.5)

## 2012-01-21 LAB — BASIC METABOLIC PANEL
BUN: 14 mg/dL (ref 6–23)
Chloride: 96 mEq/L (ref 96–112)
Creatinine, Ser: 0.64 mg/dL (ref 0.50–1.10)
Glucose, Bld: 90 mg/dL (ref 70–99)
Potassium: 3.9 mEq/L (ref 3.5–5.1)

## 2012-01-21 MED ORDER — OXYCODONE-ACETAMINOPHEN 5-325 MG PO TABS
1.0000 | ORAL_TABLET | Freq: Once | ORAL | Status: AC
  Administered 2012-01-21: 1 via ORAL
  Filled 2012-01-21: qty 1

## 2012-01-21 NOTE — Discharge Instructions (Signed)
Chest Pain (Nonspecific) It is often hard to give a specific diagnosis for the cause of chest pain. There is always a chance that your pain could be related to something serious, such as a heart attack or a blood clot in the lungs. You need to follow up with your caregiver for further evaluation. CAUSES   Heartburn.   Pneumonia or bronchitis.   Anxiety or stress.   Inflammation around your heart (pericarditis) or lung (pleuritis or pleurisy).   A blood clot in the lung.   A collapsed lung (pneumothorax). It can develop suddenly on its own (spontaneous pneumothorax) or from injury (trauma) to the chest.   Shingles infection (herpes zoster virus).  The chest wall is composed of bones, muscles, and cartilage. Any of these can be the source of the pain.  The bones can be bruised by injury.   The muscles or cartilage can be strained by coughing or overwork.   The cartilage can be affected by inflammation and become sore (costochondritis).  DIAGNOSIS  Lab tests or other studies, such as X-rays, electrocardiography, stress testing, or cardiac imaging, may be needed to find the cause of your pain.  TREATMENT   Treatment depends on what may be causing your chest pain. Treatment may include:   Acid blockers for heartburn.   Anti-inflammatory medicine.   Pain medicine for inflammatory conditions.   Antibiotics if an infection is present.   You may be advised to change lifestyle habits. This includes stopping smoking and avoiding alcohol, caffeine, and chocolate.   You may be advised to keep your head raised (elevated) when sleeping. This reduces the chance of acid going backward from your stomach into your esophagus.   Most of the time, nonspecific chest pain will improve within 2 to 3 days with rest and mild pain medicine.  HOME CARE INSTRUCTIONS   If antibiotics were prescribed, take your antibiotics as directed. Finish them even if you start to feel better.   For the next few  days, avoid physical activities that bring on chest pain. Continue physical activities as directed.   Do not smoke.   Avoid drinking alcohol.   Only take over-the-counter or prescription medicine for pain, discomfort, or fever as directed by your caregiver.   Follow your caregiver's suggestions for further testing if your chest pain does not go away.   Keep any follow-up appointments you made. If you do not go to an appointment, you could develop lasting (chronic) problems with pain. If there is any problem keeping an appointment, you must call to reschedule.  SEEK MEDICAL CARE IF:   You think you are having problems from the medicine you are taking. Read your medicine instructions carefully.   Your chest pain does not go away, even after treatment.   You develop a rash with blisters on your chest.  SEEK IMMEDIATE MEDICAL CARE IF:   You have increased chest pain or pain that spreads to your arm, neck, jaw, back, or abdomen.   You develop shortness of breath, an increasing cough, or you are coughing up blood.   You have severe back or abdominal pain, feel nauseous, or vomit.   You develop severe weakness, fainting, or chills.   You have a fever.  THIS IS AN EMERGENCY. Do not wait to see if the pain will go away. Get medical help at once. Call your local emergency services (911 in [place] Do not drive yourself to the hospital. MAKE SURE YOU:   Understand these instructions.     Will watch your condition.   Will get help right away if you are not doing well or get worse.  Document Released: 05/28/2005 Document Revised: 08/07/2011 Document Reviewed: 03/23/2008 [employer] Patient Information 2012 [employer]. 

## 2012-01-21 NOTE — ED Notes (Signed)
WUJ:[id number]<JY> Expected date:01/21/12<BR> Expected time:10:12 AM<BR> Means of arrival:<BR> Comments:<BR> anxiety

## 2012-01-21 NOTE — ED Notes (Signed)
[facility] notified for pt transport to [facility]

## 2012-01-21 NOTE — ED Provider Notes (Addendum)
History     CSN: [id number]  Arrival date & time 01/21/12  1010   First MD Initiated Contact with Patient 01/21/12 1127      Chief Complaint  Patient presents with  . Chest Pain    (Consider location/radiation/quality/duration/timing/severity/associated sxs/prior treatment) HPI Comments: Patient complains of chest pain that occurred while lying in bed.  It lasted approximately one hour.  It was similar to prior episodes but lasted longer than other episodes.  Patient received a Valium and her symptoms have completely resolved.  No shortness of breath.  No fevers.  No cough.  No nausea, vomiting or abdominal pain.  Patient denies prior cardiac history.  No specific inciting factors for it.  Patient is a 78 y.o. female presenting with chest pain. The history is provided by the patient.  Chest Pain The chest pain began 1 - 2 hours ago. Duration of episode(s) is 1 hour. The chest pain is resolved. Pertinent negatives for primary symptoms include no fever, no shortness of breath, no cough, no abdominal pain, no nausea and no vomiting.     Past Medical History  Diagnosis Date  . HYPONATREMIA 01/01/2009  . ANEMIA 08/20/2009  . LEUKOCYTOSIS 08/20/2009  . DEPRESSION, MAJOR, RECURRENT 02/01/2009  . Unspecified hearing loss 11/17/2008  . HYPERTENSION 11/17/2008  . TEMPORAL ARTERITIS 08/20/2009  . PYELONEPHRITIS 09/10/2009  . Cellulitis and abscess of upper arm and forearm 04/16/2010  . PRESSURE ULCER BUTTOCK 02/01/2009  . BACK PAIN, LUMBAR 07/29/2010  . WEAKNESS 01/01/2009  . Anorexia 12/19/2008  . Headache 04/18/2010  . DIARRHEA, CHRONIC 12/19/2008  . Stress incontinence   . Hx: UTI (urinary tract infection)   . Blood transfusion   . Foot drop, bilateral   . Clostridium difficile colitis     History of  . Pancreatitis     History of  . Anxiety   . DEGENERATIVE JOINT DISEASE 11/17/2008    shoulders, arms  . History of skin cancer   . Dysrhythmia     Hx of paroxysmal a-fib  . H/O psoriatic  arthritis   . History of SIADH   . H/O hypokalemia   . H/O mitral valve prolapse   . 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 Cholecystectomy   . Abdominal hysterectomy   . Lumbar laminectomy   . Temporal artery biopsy / ligation 06/04/2009  . Right cataract extraction     Family History  Problem Relation Age of Onset  . Cancer Neg Hx     family  . Hypertension Neg Hx     family  . Arthritis Neg Hx     family    History  Substance Use Topics  . Smoking status: Never Smoker   . Smokeless tobacco: Never Used  . Alcohol Use: No    OB History    Grav Para Term Preterm Abortions TAB SAB Ect Mult Living                  Review of Systems  Constitutional: Negative.  Negative for fever and chills.  HENT: Negative.   Eyes: Negative.  Negative for discharge and redness.  Respiratory: Negative.  Negative for cough and shortness of breath.   Cardiovascular: Positive for chest pain.  Gastrointestinal: Negative.  Negative for nausea, vomiting, abdominal pain and diarrhea.  Genitourinary: Negative.  Negative for dysuria and vaginal discharge.  Musculoskeletal: Negative.  Negative for back pain.  Skin:  Negative.  Negative for color change and rash.  Neurological: Negative.  Negative for syncope and headaches.  Hematological: Negative.  Negative for adenopathy.  Psychiatric/Behavioral: Negative.  Negative for confusion.  All other systems reviewed and are negative.    Allergies  Benazepril hcl; Epinephrine hcl; and Lidocaine  Home Medications   Current Outpatient Rx  Name Route Sig Dispense Refill  . AMLODIPINE BESYLATE 10 MG PO TABS Oral Take 10 mg by mouth daily.    Marland Kitchen CALCIUM-VITAMIN D 500-200 MG-UNIT PO TABS Oral Take 1 tablet by mouth 2 (two) times daily with a meal. 60 tablet 11  . CLONIDINE HCL 0.1 MG PO TABS Oral Take 1 tablet (0.1 mg total) by mouth daily. 90 tablet 3  . DIAZEPAM 5 MG PO  TABS Oral Take 5 mg by mouth every 12 (twelve) hours as needed. anxiety    . DOCUSATE SODIUM 100 MG PO CAPS Oral Take 100 mg by mouth every 12 (twelve) hours.    Marland Kitchen GABAPENTIN 100 MG PO CAPS Oral Take 2 capsules (200 mg total) by mouth 2 (two) times daily. 2 tabs in the AM and 2 tabs in the PM 120 capsule 11  . LEFLUNOMIDE 10 MG PO TABS Oral Take 15 mg by mouth daily.    Marland Kitchen LISINOPRIL 40 MG PO TABS Oral Take 1 tablet (40 mg total) by mouth daily. 90 tablet 3  . LOPERAMIDE HCL 2 MG PO CAPS Oral Take 2 mg by mouth 4 (four) times daily as needed. For diarrhea    . ADULT MULTIVITAMIN W/MINERALS CH Oral Take 1 tablet by mouth daily. 30 tablet 5    Therapy M 9mg - tablets  . PRESERVISION AREDS 2 PO Oral Take 2 capsules by mouth 2 (two) times daily.     Marland Kitchen ONDANSETRON HCL 8 MG PO TABS Oral Take 8 mg by mouth every 8 (eight) hours as needed. For nausea    . OXYCODONE-ACETAMINOPHEN 7.5-325 MG PO TABS Oral Take 1 tablet by mouth every 6 (six) hours.    Marland Kitchen POLYETHYLENE GLYCOL 3350 PO PACK Oral Take 17 g by mouth daily.    Marland Kitchen PREDNISONE 10 MG PO TABS Oral Take 1 tablet (10 mg total) by mouth daily. 30 tablet 11  . PROMETHAZINE HCL 25 MG PO TABS Oral Take 25 mg by mouth every 6 (six) hours as needed. For nausea    . SENNOSIDES 8.6 MG PO TABS Oral Take 1 tablet (8.6 mg total) by mouth daily. 30 tablet 11  . SERTRALINE HCL 50 MG PO TABS Oral Take 1.5 tablets (75 mg total) by mouth daily. 45 tablet 11  . BAZA PROTECT EX CREA Topical Apply 1 application topically 3 (three) times daily as needed. For redness on buttocks.    . DOCUSATE SODIUM 100 MG PO CAPS Oral Take 1 capsule (100 mg total) by mouth every 12 (twelve) hours. 60 capsule 0  . DOCUSATE SODIUM 100 MG PO CAPS Oral Take 1 capsule (100 mg total) by mouth 2 (two) times daily. 60 capsule 11  . HYDROCODONE-ACETAMINOPHEN 10-325 MG PO TABS Oral Take 1 tablet by mouth every 6 (six) hours.    Marland Kitchen PROMETHAZINE HCL 25 MG PO TABS Oral Take 1 tablet (25 mg total) by  mouth every 6 (six) hours as needed for nausea. 20 tablet 0  . RANITIDINE HCL 150 MG PO TABS Oral Take 150 mg by mouth 2 (two) times daily as needed. For reflux.      BP 146/60  Pulse 77  Temp(Src) 97.3 F (36.3 C) (Oral)  Resp 16  SpO2 97%  Physical Exam  Nursing note and vitals reviewed. Constitutional: She is oriented to person, place, and time. She appears well-developed and well-nourished.  Non-toxic appearance. She does not have a sickly appearance.  HENT:  Head: Normocephalic and atraumatic.  Eyes: Conjunctivae, EOM and lids are normal. Pupils are equal, round, and reactive to light. No scleral icterus.  Neck: Trachea normal and normal range of motion. Neck supple.  Cardiovascular: Normal rate, regular rhythm and normal heart sounds.  Exam reveals no gallop and no friction rub.   No murmur heard. Pulmonary/Chest: Effort normal and breath sounds normal. No respiratory distress. She has no wheezes. She has no rales.  Abdominal: Soft. Normal appearance. She exhibits no distension. There is no tenderness. There is no rebound, no guarding and no CVA tenderness.  Musculoskeletal: Normal range of motion.  Neurological: She is alert and oriented to person, place, and time. She has normal strength.  Skin: Skin is warm, dry and intact. No rash noted.  Psychiatric: She has a normal mood and affect. Her behavior is normal. Judgment and thought content normal.    ED Course  Procedures (including critical care time)  Results for orders placed during the hospital encounter of 01/21/12  CBC      Component Value Range   WBC 8.3  4.0 - 10.5 (K/uL)   RBC 3.44 (*) 3.87 - 5.11 (MIL/uL)   Hemoglobin 10.2 (*) 12.0 - 15.0 (g/dL)   HCT 44.0 (*) 10.2 - 46.0 (%)   MCV 90.1  78.0 - 100.0 (fL)   MCH 29.7  26.0 - 34.0 (pg)   MCHC 32.9  30.0 - 36.0 (g/dL)   RDW 72.5  36.6 - 44.0 (%)   Platelets 348  150 - 400 (K/uL)  BASIC METABOLIC PANEL      Component Value Range   Sodium 133 (*) 135 - 145  (mEq/L)   Potassium 3.9  3.5 - 5.1 (mEq/L)   Chloride 96  96 - 112 (mEq/L)   CO2 26  19 - 32 (mEq/L)   Glucose, Bld 90  70 - 99 (mg/dL)   BUN 14  6 - 23 (mg/dL)   Creatinine, Ser 3.47  0.50 - 1.10 (mg/dL)   Calcium 8.7  8.4 - 42.5 (mg/dL)   GFR calc non Af Amer 79 (*) >90 (mL/min)   GFR calc Af Amer >90  >90 (mL/min)  TROPONIN I      Component Value Range   Troponin I <0.30  <0.30 (ng/mL)  TROPONIN I      Component Value Range   Troponin I <0.30  <0.30 (ng/mL)   Dg Chest 2 View  01/21/2012  *RADIOLOGY REPORT*  Clinical Data: Chest pain  CHEST - 2 VIEW  Comparison: 05/29/2011  Findings: The lungs are clear without focal infiltrate, edema, pneumothorax or pleural effusion.  Chronic linear atelectasis or scarring is seen in the left midlung. The cardiopericardial silhouette is within normal limits for size. Telemetry leads overlie the chest.  IMPRESSION: Stable.  No acute cardiopulmonary findings.  Original Report Authenticated By: ERIC A. MANSELL, M.D.      Date: 01/21/2012  Rate: 74  Rhythm: normal sinus rhythm  QRS Axis: normal  Intervals: normal  ST/T Wave abnormalities: normal  Conduction Disutrbances:none  Narrative Interpretation:   Old EKG Reviewed: unchanged from 05/29/2011    MDM  Patient with chest pain that is atypical given that it occurred at rest.  Patient notes  that her pain improved with time.  Patient has no history of ACS.  Given patient's age I will obtain 2 sets of cardiac markers and I believe the patient will be able to be safely discharge.  She has no symptoms at this time.  No symptoms for cough, hypoxia or fevers to suggest infection.  Patient has a benign abdominal exam.  Patient is comfortable with this plan.   Nat Christen, MD 01/21/12 1159  Patient with repeat troponin that is negative.  This patient's story is atypical and she feels well now her symptoms may be related to anxiety and I feel she is safe for discharge home.  Nat Christen,  MD 01/21/12 [phone]

## 2012-01-21 NOTE — ED Notes (Signed)
Per EMS-pt from nursing facility, states that pt went to use bathroom this morning and paged aide for help. Took aide awhile to come in, pt began having some complaints of chest pain when she got back in the bed. States that pts daughter who is a retired [employer] fund[employer], insisted that she be transported to hospital. Pt has a history of anxiety. Pt received Valium at facility.

## 2012-01-23 ENCOUNTER — Encounter: Payer: Self-pay | Admitting: Family Medicine

## 2012-02-04 ENCOUNTER — Other Ambulatory Visit: Payer: Self-pay | Admitting: *Deleted

## 2012-02-04 MED ORDER — RANITIDINE HCL 150 MG PO TABS: 150.0000 mg | ORAL_TABLET | Freq: Two times a day (BID) | ORAL | Status: DC | PRN

## 2012-02-05 ENCOUNTER — Other Ambulatory Visit: Payer: Self-pay | Admitting: *Deleted

## 2012-02-05 NOTE — Telephone Encounter (Signed)
Refill for 6 months. 

## 2012-02-05 NOTE — Telephone Encounter (Signed)
Diazepam 5 mg every 6 hours prn pain.  Hard script refill request needed to [facility] fax [phone]. Request coming from [facility]

## 2012-02-06 MED ORDER — DIAZEPAM 5 MG PO TABS: 5.0000 mg | ORAL_TABLET | Freq: Two times a day (BID) | ORAL | Status: AC | PRN

## 2012-02-06 NOTE — Telephone Encounter (Signed)
Rx printed and will fax as requested.

## 2012-02-10 ENCOUNTER — Other Ambulatory Visit: Payer: Self-pay | Admitting: *Deleted

## 2012-02-10 NOTE — Telephone Encounter (Signed)
Refill OK

## 2012-02-10 NOTE — Telephone Encounter (Signed)
Percocet 7.5/325 every 6 hours last filled 01-09-12, #120 with 0 refills  Needs new hard copy faxed to [facility] [phone]

## 2012-02-11 MED ORDER — OXYCODONE-ACETAMINOPHEN 7.5-325 MG PO TABS: 1.0000 | ORAL_TABLET | Freq: Four times a day (QID) | ORAL | Status: DC

## 2012-02-11 NOTE — Telephone Encounter (Signed)
Sx signed and faxed as requested

## 2012-02-23 ENCOUNTER — Telehealth: Payer: Self-pay | Admitting: Family Medicine

## 2012-02-23 NOTE — Telephone Encounter (Signed)
Verbal order given to [facility] with Home Health

## 2012-02-23 NOTE — Telephone Encounter (Signed)
OK to order 

## 2012-02-23 NOTE — Telephone Encounter (Signed)
Pt has pressure sore on buttock. Home health nurse charlotte is requesting order to evaluate and treat pt .

## 2012-02-25 ENCOUNTER — Ambulatory Visit (INDEPENDENT_AMBULATORY_CARE_PROVIDER_SITE_OTHER): Payer: Medicare Other | Admitting: Family Medicine

## 2012-02-25 ENCOUNTER — Encounter: Payer: Self-pay | Admitting: Family Medicine

## 2012-02-25 VITALS — BP 140/72 | Temp 98.7°F | Wt 121.0 lb

## 2012-02-25 DIAGNOSIS — IMO0002 Reserved for concepts with insufficient information to code with codable children: Secondary | ICD-10-CM

## 2012-02-25 DIAGNOSIS — L8990 Pressure ulcer of unspecified site, unspecified stage: Secondary | ICD-10-CM

## 2012-02-25 DIAGNOSIS — M5416 Radiculopathy, lumbar region: Secondary | ICD-10-CM

## 2012-02-25 DIAGNOSIS — L8992 Pressure ulcer of unspecified site, stage 2: Secondary | ICD-10-CM

## 2012-02-25 MED ORDER — GABAPENTIN 300 MG PO CAPS: 300.0000 mg | ORAL_CAPSULE | Freq: Two times a day (BID) | ORAL | Status: DC

## 2012-02-25 MED ORDER — GABAPENTIN 300 MG PO CAPS: ORAL_CAPSULE | ORAL | Status: DC

## 2012-02-25 NOTE — Patient Instructions (Signed)
Pressure Ulcer  A pressure ulcer is a sore where the skin breaks down and exposes deeper layers of tissue. It develops in areas of the body where there is unrelieved pressure. Pressure ulcers are usually found over a boney area, such as the shoulder blades, spine, lower back, hips, knees, ankles, and heels.  CAUSES    Decreased ability to move.   Decreased ability to feel pain or discomfort.   Moisture from urine or stool.   Poor nutrition.   Pulling sheets that are under a patient when changing his or her position.  STAGING PRESSURE ULCERS  Your caregiver may determine the degree of severity (stage) of your pressure ulcer. The stages include:   Stage 1: The skin is red, and when the skin is pressed, it stays red.   Stage 2: The top layer of skin is gone, and there is a shallow, pink ulcer.   Stage 3: The ulcer becomes deeper, and it is more difficult to see the whole wound. Also, there may be yellow or brown parts, as well as pink and red parts.   Stage 4: The ulcer may be deep and red, pink, brown, white, or yellow. Bone or muscle may be seen.   Unstageable pressure ulcer: The ulcer is covered almost completely with black, brown, or yellow tissue. It is not known how deep the ulcer is or what stage it is until this covering comes off.   Suspected deep tissue injury: A patient's skin can be injured from pressure or pulling on the skin when his or her position is changed. The skin appears purple or maroon. There may not be an opening in the skin, but there could be a blood-filled blister. This deep tissue injury is often difficult to see in people with darker skin tones. The skin may go back to normal when pressure is relieved, or the site may open up and become deeper in time.  DIAGNOSIS   Your caregiver will diagnose your pressure ulcer based on its appearance. Your caregiver may determine the stage of your pressure ulcer as well. Your caregiver may request tests to check for infection, assess your  circulation, or to check for other diseases, such as diabetes.  TREATMENT   Treatment of your pressure ulcer begins with determining what stage the ulcer is in. Your treatment team may include your caregiver, a wound care specialist, a nutritionist, a physical therapist, and a surgeon. Treatments include:    Moving or repositioning every 1 to 2 hours.   Using beds or mattresses to shift your body weight and pressure points frequently.   Improving your diet.   Cleaning and bandaging (dressing) the open wound.   Giving antibiotic medicines.   Removing damaged tissue.   Surgery and sometimes skin grafts.  HOME CARE INSTRUCTIONS   Follow the care plan that was started in the hospital.   Avoid staying in the same position for more than 2 hours. Use padding, devices, or mattresses to cushion your pressure points as directed by your caregiver.   Eat well. Take nutritional supplements and vitamins as directed by your caregiver.   Keep all follow-up appointments.   Take pain medicine as directed by your caregiver.  SEEK MEDICAL CARE IF:    Your pressure ulcer is not improving.   You do not know how to care for your pressure ulcer.   You notice other areas of redness on your skin.  SEEK IMMEDIATE MEDICAL CARE IF:    You have increasing redness,   swelling, or pain in your pressure ulcer.   You notice pus, or increased pus, coming from your pressure ulcer.   You have a fever.   You notice a bad smell coming from the wound or dressing.   Your pressure ulcer opens up again.  MAKE SURE YOU:    Understand these instructions.   Will watch your condition.   Will get help right away if you are not doing well or get worse.  Document Released: 08/18/2005 Document Revised: 08/07/2011 Document Reviewed: 04/04/2011  [employer] Patient Information 2012 [employer].

## 2012-02-25 NOTE — Progress Notes (Signed)
Subjective:    Patient ID: Jo Alvarez, female    DOB: 1925/08/16, 78 y.o.   MRN: [id number]  HPI  Patient seen today with new problem of left buttock ulcer. Unknown duration but apparently noted at her assisted-living place recently by staff. She sits mostly. Very limited ambulation. Has some urine incontinence. No stool incontinence. They've been using topical cream without much improvement. Generally good nutrition. No associated pain.  She continues to complain of some chronic neuropathy pains especially right lower extremity. Pain radiating right lumbar all the way to the ankle and lower leg. Currently takes gabapentin 200 mg twice daily. Also takes chronic opioid but this is not controlling her pain. Difficulty sleeping at times secondary to pain.  Past Medical History  Diagnosis Date  . HYPONATREMIA 01/01/2009  . ANEMIA 08/20/2009  . LEUKOCYTOSIS 08/20/2009  . DEPRESSION, MAJOR, RECURRENT 02/01/2009  . Unspecified hearing loss 11/17/2008  . HYPERTENSION 11/17/2008  . TEMPORAL ARTERITIS 08/20/2009  . PYELONEPHRITIS 09/10/2009  . Cellulitis and abscess of upper arm and forearm 04/16/2010  . PRESSURE ULCER BUTTOCK 02/01/2009  . BACK PAIN, LUMBAR 07/29/2010  . WEAKNESS 01/01/2009  . Anorexia 12/19/2008  . Headache 04/18/2010  . DIARRHEA, CHRONIC 12/19/2008  . Stress incontinence   . Hx: UTI (urinary tract infection)   . Blood transfusion   . Foot drop, bilateral   . Clostridium difficile colitis     History of  . Pancreatitis     History of  . Anxiety   . DEGENERATIVE JOINT DISEASE 11/17/2008    shoulders, arms  . History of skin cancer   . Dysrhythmia     Hx of paroxysmal a-fib  . H/O psoriatic arthritis   . History of SIADH   . H/O hypokalemia   . H/O mitral valve prolapse   . 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 Cholecystectomy   . Abdominal hysterectomy   . Lumbar laminectomy   . Temporal artery  biopsy / ligation 06/04/2009  . Right cataract extraction     reports that she has never smoked. She has never used smokeless tobacco. She reports that she does not drink alcohol or use illicit drugs. family history is negative for Cancer, and Hypertension, and Arthritis, . Allergies  Allergen Reactions  . Benazepril Hcl     REACTION: cough  . Epinephrine Hcl     REACTION: shakes  . Lidocaine       Review of Systems  Constitutional: Negative for appetite change and unexpected weight change.  Respiratory: Negative for cough and shortness of breath.   Genitourinary: Negative for dysuria.  Musculoskeletal: Positive for back pain.  Skin: Positive for wound.       Objective:   Physical Exam  Constitutional: She is oriented to person, place, and time. She appears well-developed and well-nourished.  Cardiovascular: Normal rate and regular rhythm.   Pulmonary/Chest: Effort normal and breath sounds normal. No respiratory distress. She has no wheezes. She has no rales.  Musculoskeletal: She exhibits no edema.  Neurological: She is alert and oriented to person, place, and time.       No focal strength deficits lower extremities,  Skin:       Patient states to the decubitus ulcer left buttock. Dimension is 0.5 x 0.5 cm. No cellulitis changes.          Assessment & Plan:  #1 stage II left buttock ulcer. No secondary infection. DuoDERM  occlusive dressing for 5 days then remove clean with soap and water and dry and reapply and after second course of DuoDERM for 5 days followup to reassess #2 right lumbar radiculopathy pains. Titrate gabapentin 300 mg twice a day.

## 2012-02-26 ENCOUNTER — Telehealth: Payer: Self-pay | Admitting: Family Medicine

## 2012-02-26 NOTE — Telephone Encounter (Signed)
Jo Alvarez at pt home was given verbal order to use hydrogel

## 2012-02-26 NOTE — Telephone Encounter (Signed)
[facility] called and said that they have questions re: Order for pressure ulcer. Duo-derm can not be used on broken skin. Nurse wants to use Hydrogel on broken area and cover foam padding or dressing for protection. Want to do this 3 times a wk for the next 2 wks and then re-eval? Pls advise if ok?

## 2012-02-26 NOTE — Telephone Encounter (Signed)
yes

## 2012-03-02 ENCOUNTER — Encounter: Payer: Self-pay | Admitting: Family Medicine

## 2012-03-02 ENCOUNTER — Ambulatory Visit (INDEPENDENT_AMBULATORY_CARE_PROVIDER_SITE_OTHER): Payer: Medicare Other | Admitting: Family Medicine

## 2012-03-02 VITALS — BP 122/68 | Temp 98.0°F | Wt 122.0 lb

## 2012-03-02 DIAGNOSIS — L8990 Pressure ulcer of unspecified site, unspecified stage: Secondary | ICD-10-CM

## 2012-03-02 DIAGNOSIS — L8992 Pressure ulcer of unspecified site, stage 2: Secondary | ICD-10-CM

## 2012-03-02 DIAGNOSIS — H6120 Impacted cerumen, unspecified ear: Secondary | ICD-10-CM

## 2012-03-02 NOTE — Progress Notes (Signed)
  Subjective:    Patient ID: Jo Alvarez, female    DOB: [birth year], 78 y.o.   MRN: [id number]  HPI  Patient seen for possible ear irrigation. Increased cerumen. She wears bilateral hearing aids. Decreased hearing acuity recently. No ear drainage.  Stage II decubitus ulcer left buttock. Nurses following this at her facility. We had prescribed colloidal dressing recently. This is improving. No pain.  Patient has history of urine and stool incontinence.  Generally good nutrition.   Review of Systems  Constitutional: Negative for fever and chills.  HENT: Positive for hearing loss. Negative for ear pain and ear discharge.        Objective:   Physical Exam  Constitutional: She appears well-developed and well-nourished.  HENT:       Bilateral cerumen impactions right greater than left  Cardiovascular: Normal rate and regular rhythm.   Pulmonary/Chest: Effort normal and breath sounds normal. No respiratory distress. She has no wheezes. She has no rales.  Musculoskeletal: She exhibits no edema.  Skin:        Healing stage II decubitus ulcer left buttock. Only about 1 mm diameter this time. No signs of secondary infection          Assessment & Plan:  #1 bilateral cerumen impactions. Irrigation-removed without difficulty. #2 healing stage II decubitus ulcer buttock.  improved. Continue topical treatment.

## 2012-03-11 ENCOUNTER — Telehealth: Payer: Self-pay | Admitting: *Deleted

## 2012-03-11 NOTE — Telephone Encounter (Signed)
Charlotte, [facility] Nurse that has been visiting pt at [facility] reports her decubitus ulcer on her left buttock is finally healed.  Pt has Baza Cream as a PRN order on her med list.  Claris Gower requesting Baza cream become a regular BID order so the staff will apply cream as well as check her bottom twice daily as pt has stool incontinence.  This will require then to check her bottom twice daily, especially at bedtime would be perfect.  This must be a written order to be faxed to [phone].  Also Claris Gower is requesting a verbal order to have one additional nurse visit order for tomorrow as she will D/C pt from Home Health.  She wants to explain to pt the staff will be applying Baza cream twice daily to her bottom to hopefully avoid another ulcer, especially at bedtime.  Order faxed, confirmation received, verbal order given on Charlotte's personally identified VM for one final nurse visit tomorrow for D/C from care and check her bottom one more time, giving instructions to staff and reviewing with pt.

## 2012-03-15 ENCOUNTER — Telehealth: Payer: Self-pay | Admitting: Family Medicine

## 2012-03-15 NOTE — Telephone Encounter (Signed)
Triage Call Report Triage Record Num: [id number] Operator: Rebeca Allegra Patient Name: Jo Alvarez Call Date & Time: 03/13/2012 7:11:36PM Patient Phone: [phone] PCP: Evelena Peat Patient Gender: Female PCP Fax : [phone] Patient DOB: [birth year] [facility] Name: [facility] Reason for Call: Caller: Mauricia Area; PCP: Evelena Peat; CB#: [phone]; Call regarding Need orders for Percocet; Refill request sent to office 03/11/12, but refill was not authorized. Instructed Clarisse Gouge to fax a refill request to the office and to f/u when open. Advised UC or ER for severe pain. Protocol(s) Used: Medication Questions - Adult Recommended Outcome per Protocol: Speak with Provider or Pharmacist within 24 hours Reason for Outcome: Requests refill of prescribed medication with valid refills; lack of medications does not put patient at clinical risk Care Advice: ~

## 2012-03-15 NOTE — Telephone Encounter (Signed)
[facility] Triage Call Report Triage Record Num: [id number] Operator: Laren Boom Patient Name: Jo Alvarez Call Date & Time: 03/13/2012 6:37:38PM Patient Phone: [phone] PCP: Evelena Peat Patient Gender: Female PCP Fax : [phone] Patient DOB: 17-May-1925 Practice Name: [facility] Reason for Call: Caller: Fritzi Mandes; PCP: Evelena Peat; CB#: [phone]; [facility]. Percocet Prescription 10/325 - Chronic Pain. Son was told that a request was sent to the doctor on Wednesday for a refill and that they have not heard anything back. Was told that someone from the faciltiy called today and was told that Dr [name] was not on this weekend. Checked for note on call in [facility] and did not find one. Advised son to have the facility call the office number back to see if anything can be done for her, since they take care of her medications. Protocol(s) Used: Office Note Recommended Outcome per Protocol: Information Noted and Sent to Office Reason for Outcome: Caller information to office Care Advice: ~ 07/

## 2012-03-15 NOTE — Telephone Encounter (Signed)
[facility] Triage Call Report Triage Record Num: [id number] Operator: Bennie Hind Patient Name: Jo Alvarez Call Date & Time: 03/14/2012 2:14:29PM Patient Phone: [phone] PCP: Evelena Peat Patient Gender: Female PCP Fax : [phone] Patient DOB: Jan 12, 1925 Practice Name: [facility] Reason for Call: Caller: Lucia Bitter; PCP: Evelena Peat; CB#: [phone]; Call regarding Orders for Vicodin; Per Carollee Herter med tec at [facility] she said pt is out of Percocet and they know they can't get anything done about that until tomorrow. She is in chronic pain has pain syndrome and peripheral neuropathy. she was on Nocor prior to [facility] had some of her Vicodin 10/325 and have been giving it q6h while they are there. They would like order to continue to give med as directed on bottle one pill QID as needed until Percocet is able to be filled. Dr Dayton Martes called and info communicated and she approved she approved using patients med as directed on bottle until percocet filled. Rx order faxed to facility Protocol(s) Used: Office Note Recommended Outcome per Protocol: Information Noted and Sent to Office Reason for Outcome: Caller information to office Care Advice: ~ 07/

## 2012-03-16 ENCOUNTER — Encounter: Payer: Self-pay | Admitting: Family Medicine

## 2012-03-16 ENCOUNTER — Ambulatory Visit (INDEPENDENT_AMBULATORY_CARE_PROVIDER_SITE_OTHER): Payer: Medicare Other | Admitting: Family Medicine

## 2012-03-16 DIAGNOSIS — M545 Low back pain: Secondary | ICD-10-CM

## 2012-03-16 DIAGNOSIS — G8929 Other chronic pain: Secondary | ICD-10-CM

## 2012-03-16 DIAGNOSIS — G609 Hereditary and idiopathic neuropathy, unspecified: Secondary | ICD-10-CM

## 2012-03-16 DIAGNOSIS — L89309 Pressure ulcer of unspecified buttock, unspecified stage: Secondary | ICD-10-CM

## 2012-03-16 DIAGNOSIS — L8990 Pressure ulcer of unspecified site, unspecified stage: Secondary | ICD-10-CM

## 2012-03-16 DIAGNOSIS — M7500 Adhesive capsulitis of unspecified shoulder: Secondary | ICD-10-CM

## 2012-03-16 DIAGNOSIS — G629 Polyneuropathy, unspecified: Secondary | ICD-10-CM

## 2012-03-16 MED ORDER — OXYCODONE-ACETAMINOPHEN 10-325 MG PO TABS: 1.0000 | ORAL_TABLET | Freq: Four times a day (QID) | ORAL | Status: DC

## 2012-03-16 NOTE — Patient Instructions (Addendum)
Adhesive Capsulitis Sometimes the shoulder becomes stiff and is painful to move. Some people say it feels as if the shoulder is frozen in place. Because of this, the condition is called "frozen shoulder." Its medical name is adhesive capsulitis.  The shoulder joint is made up of strong connective tissue that attaches the ball of the humerus to the shallow shoulder socket. This strong connective tissue is called the joint capsule. This tissue can become stiff and swollen. That is when adhesive capsulitis sets in. CAUSES  It is not always clear just what the cause adhesive capsulitis. Possibilities include:  Injury to the shoulder joint.   Strain. This is a repetitive injury brought about by overuse.   Lack of use. Perhaps your arm or hand was otherwise injured. It might have been in a sling for awhile. Or perhaps you were not using it to avoid pain.   Referred pain. This is a sort of trick the body plays. You feel pain in the shoulder. But, the pain actually comes from an injury somewhere else in the body.   Long-standing health problems. Several diseases can cause adhesive capsulitis. They include diabetes, heart disease, stroke, thyroid problems, rheumatoid arthritis and lung disease.   Being a women older than 40. Anyone can develop adhesive capsulitis but it is most common in women in this age group.  SYMPTOMS   Pain.   It occurs when the arm is moved.   Parts of the shoulder might hurt if they are touched.   Pain is worse at night or when resting.   Soreness. It might not be strong enough to be called pain. But, the shoulder aches.   The shoulder does not move freely.   Muscle spasms.   Trouble sleeping because of shoulder ache or pain.  DIAGNOSIS  To decide if you have adhesive capsulitis, your healthcare provider will probably:  Ask about symptoms you have noticed.   Ask about your history of joint pain and anything that might have caused the pain.   Ask about your  overall health.   Use hands to feel your shoulder and neck.   Ask you to move your shoulder in specific directions. This may indicate the origin of the pain.   Order imaging tests; pictures of the shoulder. They help pinpoint the source of the problem. An X-ray might be used. For more detail, an MRI is often used. An MRI details the tendons, muscles and ligaments as well as the joint.  TREATMENT  Adhesive capsulitis can be treated several ways. Most treatments can be done in a clinic or in your healthcare provider's office. Be sure to discuss the different options with your caregiver. They include:  Physical therapy. You will work on specific exercises to get your shoulder moving again. The exercises usually involve stretching. A physical therapist (a caregiver with special training) can show you what to do and what not to do. The exercises will need to be done daily.   Medication.   Over-the-counter medicines may relieve pain and inflammation (the body's way of reacting to injury or infection).   Corticosteroids. These are stronger drugs to reduce pain and inflammation. They are given by injection (shots) into the shoulder joint. Frequent treatment is not recommended.   Muscle relaxants. Medication may be prescribed to ease muscle spasms.   Treatment of underlying conditions. This means treating another condition that is causing your shoulder problem. This might be a rotator cuff (tendon) problem   Shoulder manipulation. The shoulder will   be moved by your healthcare provider. You would be under general anesthesia (given a drug that puts you to sleep). You would not feel anything. Sometimes the joint will be injected with salt water (saline) at high pressure to break down internal scarring in the joint capsule.   Surgery. This is rarely needed. It may be suggested in advanced cases after all other treatment has failed.  PROGNOSIS  In time, most people recover from adhesive capsulitis.  Sometimes, however, the pain goes away but full movement of the shoulder does not return.  HOME CARE INSTRUCTIONS   Take any pain medications recommended by your healthcare provider. Follow the directions carefully.   If you have physical therapy, follow through with the therapist's suggestions. Be sure you understand the exercises you will be doing. You should understand:   How often the exercises should be done.   How many times each exercise should be repeated.   How long they should be done.   What other activities you should do, or not do.   That you should warm up before doing any exercise. Just 5 to 10 minutes will help. Small, gentle movements should get your shoulder ready for more.   Avoid high-demand exercise that involves your shoulder such as throwing. This type of exercise can make pain worse.   Consider using cold packs. Cold may ease swelling and pain. Ask your healthcare provider if a cold pack might help you. If so, get directions on how and when to use them.  SEEK MEDICAL CARE IF:   You have any questions about your medications.   Your pain continues to increase.  Document Released: 06/15/2009 Document Revised: 08/07/2011 Document Reviewed: 06/15/2009 ExitCare Patient Information 2012 [employer]. 

## 2012-03-16 NOTE — Progress Notes (Signed)
Subjective:    Patient ID: Jo Alvarez, female    DOB: [birth year], 78 y.o.   MRN: [id number]  HPI  Patient seen for several issues as follows  Recent sacral decubitus ulcer. Followed by skilled nursing at her residential facility and by report this has healed. She has urine incontinence. Minimal ambulation.  Chronic pain. She has history of chronic neuropathy and also chronic low back pain. Also complaining of bilateral shoulder pain. She rates her pain 10/10 frequently and usually about one hour after oxycodone 7.5 mg pain level goes to 3-4/10 but pain relief only lasting about 3-4 hours. She's also chronic gabapentin for neuropathy type pain and we recently increased to 300 mg twice a day this has not helped much. She's had some mild constipation intermittently but none recently.  Complains of some progressive loss of range of motion both shoulders left greater than right over recent weeks- if not months. No injury. Pain with abduction. Has apparently had recurrent effusion left shoulder in the past previously drained by rheumatologist. She had this injected with cortisone the past but not over one year. She's had physical therapy regarding her generalized weakness and not regarding her shoulder pain.  Past Medical History  Diagnosis Date  . HYPONATREMIA 01/01/2009  . ANEMIA 08/20/2009  . LEUKOCYTOSIS 08/20/2009  . DEPRESSION, MAJOR, RECURRENT 02/01/2009  . Unspecified hearing loss 11/17/2008  . HYPERTENSION 11/17/2008  . TEMPORAL ARTERITIS 08/20/2009  . PYELONEPHRITIS 09/10/2009  . Cellulitis and abscess of upper arm and forearm 04/16/2010  . PRESSURE ULCER BUTTOCK 02/01/2009  . BACK PAIN, LUMBAR 07/29/2010  . WEAKNESS 01/01/2009  . Anorexia 12/19/2008  . Headache 04/18/2010  . DIARRHEA, CHRONIC 12/19/2008  . Stress incontinence   . Hx: UTI (urinary tract infection)   . Blood transfusion   . Foot drop, bilateral   . Clostridium difficile colitis     History of  . Pancreatitis     History  of  . Anxiety   . DEGENERATIVE JOINT DISEASE 11/17/2008    shoulders, arms  . History of skin cancer   . Dysrhythmia     Hx of paroxysmal a-fib  . H/O psoriatic arthritis   . History of SIADH   . H/O hypokalemia   . H/O mitral valve prolapse   . 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 Cholecystectomy   . Abdominal hysterectomy   . Lumbar laminectomy   . Temporal artery biopsy / ligation 06/04/2009  . Right cataract extraction     reports that she has never smoked. She has never used smokeless tobacco. She reports that she does not drink alcohol or use illicit drugs. family history is negative for Cancer, and Hypertension, and Arthritis, . Allergies  Allergen Reactions  . Benazepril Hcl     REACTION: cough  . Epinephrine Hcl     REACTION: shakes  . Lidocaine       Review of Systems  Constitutional: Positive for fatigue (chronic). Negative for appetite change and unexpected weight change.  Respiratory: Negative for shortness of breath.   Cardiovascular: Negative for chest pain and palpitations.  Gastrointestinal: Negative for abdominal pain.  Genitourinary: Negative for dysuria.  Musculoskeletal: Positive for back pain and arthralgias.  Neurological: Negative for dizziness and headaches.       Objective:   Physical Exam  Constitutional: She appears well-developed and well-nourished.  Cardiovascular: Normal rate and regular rhythm.   Pulmonary/Chest: Effort normal and  breath sounds normal. No respiratory distress. She has no wheezes. She has no rales.  Musculoskeletal: She exhibits no edema.       Patient has evidence for left shoulder effusion. No warmth or erythema. Very restricted range of motion left shoulder with abduction to about 90. No localized tenderness.  Skin:       Patient some mild erythema the sacral region and buttock but no evidence for skin breakdown          Assessment &  Plan:  #1 chronic pain syndrome related to her neuropathy and chronic back pain. Titrate oxycodone to 10 mg one every 6 hours. New prescription given. Consider transitioning to OxyContin in future. She has tried multiple things including regular Tylenol and tramadol in the past without relief. She has very poor pain control at this time. Did not see any recent improvement with titration of gabapentin #2 sacral decubitus ulcer which is healed. Discussed prevention issues with patient and son .  Staff to monitor closely at her facility. #3 progressive shoulder pain with loss of range of motion. Suspect bilateral adhesive capsulitis left greater than right. Recommended physical therapy and start with corticosteroid injection left shoulder to help facilitate hopefully some physical therapy.   Discussed risks and benefits of corticosteroid injection and patient consented.  After prepping skin with betadine, injected 40 mg depomedrol and 2 cc of plain xylocaine with 23 gauge one and one half inch needle using posterior- lateral approach and pt tolerated well.

## 2012-03-22 ENCOUNTER — Other Ambulatory Visit: Payer: Self-pay | Admitting: *Deleted

## 2012-03-22 MED ORDER — PRESERVISION AREDS PO CAPS: ORAL_CAPSULE | ORAL | Status: DC

## 2012-03-25 ENCOUNTER — Telehealth: Payer: Self-pay | Admitting: Family Medicine

## 2012-03-25 NOTE — Telephone Encounter (Signed)
I gave son Jo Alvarez a Rx stating Orel may use wheelchair at mealtime and as needed other times.  Don stopped by the office frustrated this new state regulation and the home not telling him. FYI only

## 2012-03-25 NOTE — Telephone Encounter (Signed)
Need to get a verbal order. Pt is currently in wheelchair.  State regulations will not allow pt to have wheelchair in dinning area without order from pcp. Pt needs to have an order to be able to remain in wheelchair during dining. Pt has a lot of neuropathy, arthropathy, and has hip and knee replacements. Need asap. Pls fax to [phone] att Melanie. Need this asap today. Pt has not eaten yet and is in pain.

## 2012-03-30 NOTE — Telephone Encounter (Signed)
[facility] called to check on status of a Face to Face encounter form that was faxed over on 03/09/12 and again on the 03/23/12. Have lft several vms on nurse phone. No response. Need this asap for our billing. Pls call.

## 2012-03-31 NOTE — Telephone Encounter (Signed)
Are you aware of this form?  

## 2012-03-31 NOTE — Telephone Encounter (Signed)
no

## 2012-04-01 ENCOUNTER — Other Ambulatory Visit: Payer: Self-pay | Admitting: *Deleted

## 2012-04-01 MED ORDER — OXYCODONE-ACETAMINOPHEN 10-325 MG PO TABS: 1.0000 | ORAL_TABLET | Freq: Four times a day (QID) | ORAL | Status: AC

## 2012-04-01 NOTE — Telephone Encounter (Signed)
We received a form that we thought was a refill request, so a script for #120 of the Percocet 10-325, however I called [facility] and they have a hard copy of same dated 7/16, so pt was given refill at [facility] that we could not see in Epic.  So, today's Rx was shreaded and pt will be due again 8/16.

## 2012-04-01 NOTE — Telephone Encounter (Signed)
Message left with receptionist (clinical staff in a meeting), stating we are not aware of a "face to face" form and nurse has not received any messages on her VM.  Receptionist will give message to staff

## 2012-04-02 ENCOUNTER — Other Ambulatory Visit: Payer: Self-pay | Admitting: *Deleted

## 2012-04-02 MED ORDER — AMLODIPINE BESYLATE 10 MG PO TABS: 10.0000 mg | ORAL_TABLET | Freq: Every day | ORAL | Status: AC

## 2012-04-15 ENCOUNTER — Other Ambulatory Visit: Payer: Self-pay | Admitting: *Deleted

## 2012-04-15 NOTE — Telephone Encounter (Signed)
Faxed refill request from [facility] for Oxycodone 10-325.  Last filled 04/01/17, #120 with 0 refills.  They are asking to fax hard script Rx

## 2012-04-15 NOTE — Telephone Encounter (Signed)
Why are they needing if done on 04-01-12?

## 2012-04-17 NOTE — Telephone Encounter (Signed)
Spoke with [employer].  The patient is completely out of Percocet.  However the family does have some Narco left and an faxed order needs to be given to [facility] to administer the medication? Is this order okay to give?  Fax [phone] Phone [phone]

## 2012-04-17 NOTE — Telephone Encounter (Signed)
Ebony from [facility] is calling regarding the refill of Percocet . I tried to call but the line is busy

## 2012-04-18 NOTE — Telephone Encounter (Signed)
Please clarify. I am not familiar with any drug named "narco", ?Norco.  Facility should be responsible for making sure her medication does not run out.  We need specifics for medication and dosage (ie mg).

## 2012-04-19 ENCOUNTER — Telehealth: Payer: Self-pay | Admitting: Family Medicine

## 2012-04-19 MED ORDER — OXYCODONE-ACETAMINOPHEN 7.5-325 MG PO TABS: 1.0000 | ORAL_TABLET | Freq: Four times a day (QID) | ORAL | Status: DC | PRN

## 2012-04-19 NOTE — Telephone Encounter (Signed)
Note in chart states refill from 04/01/12  has been shredded.  Okay to refill per Dr [name]

## 2012-04-19 NOTE — Telephone Encounter (Signed)
[facility] Triage Call Report Triage Record Num: [id number] Operator: Hyman Bower Patient Name: Jo Alvarez Call Date & Time: 04/17/2012 8:31:27AM Patient Phone: [phone] PCP: Evelena Peat Patient Gender: Female PCP Fax : [phone] Patient DOB: November 24, 1924 [facility] Name[facility] Reason for Call: Caller: Ebony/SIC; PCP: Evelena Peat; CB#: [phone]; Call regarding Order for Percocet 10/325; Caller reports that they needed a hand written prescription for this medication to be renewed and they did not get that this week. Emergent symptom of "Persistent or worsening pain or impaired functioning even when following prescribed treatment" positive per Hip Non-Injury guideline. Offered to call in the standing order of Ultram for pain, but the patient's son reports that she doesn't need that medication as it does not work well for the patient. Called the [employer] office and a message has been sent to the provider on call at this time and someone will call Ebony back regarding this medication refill. Protocol(s) Used: Hip Non-Injury Recommended Outcome per Protocol: See Provider within 24 hours Reason for Outcome: Persistent or worsening pain OR impaired functioning (change in normal gait, inability to remove socks or cross legs) even when following prescribed treatment

## 2012-04-19 NOTE — Telephone Encounter (Signed)
Refill faxed

## 2012-04-21 ENCOUNTER — Ambulatory Visit: Payer: Medicare Other | Admitting: Family Medicine

## 2012-04-26 ENCOUNTER — Encounter: Payer: Self-pay | Admitting: Family Medicine

## 2012-05-10 ENCOUNTER — Telehealth: Payer: Self-pay | Admitting: *Deleted

## 2012-05-10 MED ORDER — OXYCODONE-ACETAMINOPHEN 10-325 MG PO TABS: 1.0000 | ORAL_TABLET | Freq: Four times a day (QID) | ORAL | Status: DC

## 2012-05-10 NOTE — Telephone Encounter (Signed)
Let's increase her Percocet to 10/325 mg one po q 6 hours OK to get Home Health RN to observe.

## 2012-05-10 NOTE — Telephone Encounter (Signed)
One of the staff members called to report the pharmacy they work with will accept a faxed Rx for Percocet.  Fax number is [phone].  I also faxed back the order for Home Health RN to observe pt.

## 2012-05-10 NOTE — Telephone Encounter (Signed)
Received a fax from [facility] reporting "patient has been c/o increased pain over the last couple of weeks, this AM was found crying.  Could her 'as needed' pain med be scheduled?"  Also requesting a Home Health RN order to observe and assess due to increasing c/o pain.  Form is on your desk for signature

## 2012-06-09 ENCOUNTER — Other Ambulatory Visit: Payer: Self-pay | Admitting: *Deleted

## 2012-06-09 NOTE — Telephone Encounter (Signed)
Refill OK when due on Oct 18th.

## 2012-06-09 NOTE — Telephone Encounter (Signed)
Percocet every 6 hours #120 with 0 refills last filled 05/20/12 Hard copy to be faxed to [facility] [phone]

## 2012-06-10 ENCOUNTER — Encounter: Payer: Self-pay | Admitting: Family Medicine

## 2012-06-10 MED ORDER — OXYCODONE-ACETAMINOPHEN 10-325 MG PO TABS: 1.0000 | ORAL_TABLET | Freq: Four times a day (QID) | ORAL | Status: DC

## 2012-06-10 NOTE — Telephone Encounter (Signed)
Jo Alvarez from [facility] called back. States they need this faxed to the pharmacy by 3pm today, so she can get the medication on tonight's delivery. Any questions, please call her at [phone].

## 2012-06-10 NOTE — Telephone Encounter (Signed)
I made a mistake, last filled on 05/10/12, so it will be printed and faxed today.  Dr [name], signed, Rx will be faxed today

## 2012-07-07 ENCOUNTER — Other Ambulatory Visit: Payer: Self-pay | Admitting: *Deleted

## 2012-07-07 NOTE — Telephone Encounter (Signed)
Refill OK

## 2012-07-07 NOTE — Telephone Encounter (Signed)
Hydrocodone 10-325 last filled and faxed 06-10-12, #120 with 0 refills

## 2012-07-08 MED ORDER — OXYCODONE-ACETAMINOPHEN 10-325 MG PO TABS: 1.0000 | ORAL_TABLET | Freq: Four times a day (QID) | ORAL | Status: DC

## 2012-07-08 NOTE — Telephone Encounter (Signed)
Rx printed, signed and faxed, confirmation received

## 2012-07-20 ENCOUNTER — Telehealth: Payer: Self-pay | Admitting: *Deleted

## 2012-07-20 NOTE — Telephone Encounter (Signed)
As long as prn and not scheduled, OK to continue

## 2012-07-20 NOTE — Telephone Encounter (Signed)
Resident has an order form ER dept for Zofran ODT 8 mg tab prn nausea.  Pharmacy would like to know if this order is supposed to be continued.  Zofran last filled 11/26/11, #20 with 0 refills

## 2012-07-21 NOTE — Telephone Encounter (Signed)
[facility]ri[facility] Sine [facility] called to verify medications pt is currently taking.  Requesting a call back at [phone]

## 2012-07-22 NOTE — Telephone Encounter (Signed)
This has been taken care of.

## 2012-08-05 ENCOUNTER — Other Ambulatory Visit: Payer: Self-pay | Admitting: *Deleted

## 2012-08-05 MED ORDER — OXYCODONE-ACETAMINOPHEN 10-325 MG PO TABS: 1.0000 | ORAL_TABLET | Freq: Four times a day (QID) | ORAL | Status: DC

## 2012-08-05 NOTE — Telephone Encounter (Signed)
Oxycodone 10-325 last filled 07-08-12, #120 with 0 refills Fax to [facility] at [phone]

## 2012-08-05 NOTE — Telephone Encounter (Signed)
Rx will be signed and faxed as requested

## 2012-08-05 NOTE — Telephone Encounter (Signed)
Refill OK

## 2012-08-16 ENCOUNTER — Telehealth: Payer: Self-pay | Admitting: Family Medicine

## 2012-08-16 NOTE — Telephone Encounter (Signed)
Patient landed hard on left hip on arm of chair today.  Physical Therapist is requesting order for mobile x-ray. Please fax to [phone]

## 2012-08-16 NOTE — Telephone Encounter (Signed)
Written order faxed back to [facility] [place] to x-ray left hip

## 2012-08-23 ENCOUNTER — Other Ambulatory Visit: Payer: Self-pay | Admitting: *Deleted

## 2012-08-23 MED ORDER — RANITIDINE HCL 150 MG PO TABS: 150.0000 mg | ORAL_TABLET | Freq: Two times a day (BID) | ORAL | Status: DC

## 2012-08-31 ENCOUNTER — Other Ambulatory Visit: Payer: Self-pay

## 2012-08-31 MED ORDER — GABAPENTIN 300 MG PO CAPS: 300.0000 mg | ORAL_CAPSULE | Freq: Two times a day (BID) | ORAL | Status: DC

## 2012-09-03 ENCOUNTER — Other Ambulatory Visit: Payer: Self-pay | Admitting: *Deleted

## 2012-09-03 MED ORDER — OXYCODONE-ACETAMINOPHEN 10-325 MG PO TABS: 1.0000 | ORAL_TABLET | Freq: Four times a day (QID) | ORAL | Status: DC

## 2012-09-03 NOTE — Telephone Encounter (Signed)
Refill OK

## 2012-09-03 NOTE — Telephone Encounter (Signed)
Rx signed, printed, will be faxed

## 2012-09-03 NOTE — Telephone Encounter (Signed)
Oxycodone 10-325 refill request from [facility].  Last filled and faxed to [facility] on 08-05-12, #120 with 0 refills

## 2012-10-07 ENCOUNTER — Other Ambulatory Visit: Payer: Self-pay | Admitting: *Deleted

## 2012-10-07 NOTE — Telephone Encounter (Signed)
Dr [name] out of the office thru 2/12, requesting refill and signature of Oxycodone 10-325, one tab every 6 hours pain last filled 09/03/12, #120 with 0 refills.  I will have you sign it Friday.

## 2012-10-08 MED ORDER — OXYCODONE-ACETAMINOPHEN 10-325 MG PO TABS: 1.0000 | ORAL_TABLET | Freq: Four times a day (QID) | ORAL | Status: AC

## 2012-10-08 NOTE — Telephone Encounter (Signed)
Per Melchor Amour LPN, Dr Lovell Sheehan nurse, go ahead and print Rx and he will sign it.  Rx faxed to [facility], [phone], confirmation received.

## 2012-10-14 ENCOUNTER — Inpatient Hospital Stay (HOSPITAL_COMMUNITY)
Admission: EM | Admit: 2012-10-14 | Discharge: 2012-10-22 | DRG: 871 | Disposition: A | Discharge status: Skilled Nursing Facility | Payer: Medicare Other | Attending: Internal Medicine | Admitting: Internal Medicine

## 2012-10-14 ENCOUNTER — Emergency Department (HOSPITAL_COMMUNITY): Payer: Medicare Other

## 2012-10-14 ENCOUNTER — Encounter (HOSPITAL_COMMUNITY): Payer: Self-pay | Admitting: Emergency Medicine

## 2012-10-14 DIAGNOSIS — R0902 Hypoxemia: Secondary | ICD-10-CM | POA: Diagnosis present

## 2012-10-14 DIAGNOSIS — E876 Hypokalemia: Secondary | ICD-10-CM | POA: Diagnosis present

## 2012-10-14 DIAGNOSIS — A419 Sepsis, unspecified organism: Principal | ICD-10-CM

## 2012-10-14 DIAGNOSIS — L4050 Arthropathic psoriasis, unspecified: Secondary | ICD-10-CM | POA: Diagnosis present

## 2012-10-14 DIAGNOSIS — M316 Other giant cell arteritis: Secondary | ICD-10-CM | POA: Diagnosis present

## 2012-10-14 DIAGNOSIS — K5289 Other specified noninfective gastroenteritis and colitis: Secondary | ICD-10-CM

## 2012-10-14 DIAGNOSIS — N170 Acute kidney failure with tubular necrosis: Secondary | ICD-10-CM

## 2012-10-14 DIAGNOSIS — J9601 Acute respiratory failure with hypoxia: Secondary | ICD-10-CM | POA: Diagnosis present

## 2012-10-14 DIAGNOSIS — J9600 Acute respiratory failure, unspecified whether with hypoxia or hypercapnia: Secondary | ICD-10-CM | POA: Diagnosis present

## 2012-10-14 DIAGNOSIS — E860 Dehydration: Secondary | ICD-10-CM

## 2012-10-14 DIAGNOSIS — E872 Acidosis, unspecified: Secondary | ICD-10-CM | POA: Diagnosis present

## 2012-10-14 DIAGNOSIS — R7989 Other specified abnormal findings of blood chemistry: Secondary | ICD-10-CM

## 2012-10-14 DIAGNOSIS — Z8639 Personal history of other endocrine, nutritional and metabolic disease: Secondary | ICD-10-CM

## 2012-10-14 DIAGNOSIS — N179 Acute kidney failure, unspecified: Secondary | ICD-10-CM

## 2012-10-14 DIAGNOSIS — Z79899 Other long term (current) drug therapy: Secondary | ICD-10-CM

## 2012-10-14 DIAGNOSIS — D72829 Elevated white blood cell count, unspecified: Secondary | ICD-10-CM

## 2012-10-14 DIAGNOSIS — K529 Noninfective gastroenteritis and colitis, unspecified: Secondary | ICD-10-CM

## 2012-10-14 DIAGNOSIS — K55059 Acute (reversible) ischemia of intestine, part and extent unspecified: Secondary | ICD-10-CM | POA: Diagnosis present

## 2012-10-14 DIAGNOSIS — F339 Major depressive disorder, recurrent, unspecified: Secondary | ICD-10-CM | POA: Diagnosis present

## 2012-10-14 DIAGNOSIS — R197 Diarrhea, unspecified: Secondary | ICD-10-CM

## 2012-10-14 DIAGNOSIS — N390 Urinary tract infection, site not specified: Secondary | ICD-10-CM

## 2012-10-14 DIAGNOSIS — A498 Other bacterial infections of unspecified site: Secondary | ICD-10-CM | POA: Diagnosis present

## 2012-10-14 DIAGNOSIS — I10 Essential (primary) hypertension: Secondary | ICD-10-CM

## 2012-10-14 DIAGNOSIS — R109 Unspecified abdominal pain: Secondary | ICD-10-CM | POA: Insufficient documentation

## 2012-10-14 DIAGNOSIS — R52 Pain, unspecified: Secondary | ICD-10-CM

## 2012-10-14 DIAGNOSIS — IMO0002 Reserved for concepts with insufficient information to code with codable children: Secondary | ICD-10-CM

## 2012-10-14 DIAGNOSIS — R8290 Unspecified abnormal findings in urine: Secondary | ICD-10-CM | POA: Diagnosis present

## 2012-10-14 DIAGNOSIS — K859 Acute pancreatitis without necrosis or infection, unspecified: Secondary | ICD-10-CM

## 2012-10-14 DIAGNOSIS — Z66 Do not resuscitate: Secondary | ICD-10-CM | POA: Diagnosis present

## 2012-10-14 LAB — CBC WITH DIFFERENTIAL/PLATELET
Basophils Relative: 0 % (ref 0–1)
Eosinophils Relative: 0 % (ref 0–5)
HCT: 39.9 % (ref 36.0–46.0)
Hemoglobin: 12.7 g/dL (ref 12.0–15.0)
Lymphs Abs: 0.8 10*3/uL (ref 0.7–4.0)
MCV: 93.2 fL (ref 78.0–100.0)
Monocytes Relative: 6 % (ref 3–12)
Neutro Abs: 24.2 10*3/uL — ABNORMAL HIGH (ref 1.7–7.7)
RBC: 4.28 MIL/uL (ref 3.87–5.11)
WBC Morphology: INCREASED
WBC: 26.6 10*3/uL — ABNORMAL HIGH (ref 4.0–10.5)

## 2012-10-14 LAB — COMPREHENSIVE METABOLIC PANEL
ALT: 22 U/L (ref 0–35)
AST: 51 U/L — ABNORMAL HIGH (ref 0–37)
Albumin: 3.5 g/dL (ref 3.5–5.2)
Alkaline Phosphatase: 101 U/L (ref 39–117)
Calcium: 9.3 mg/dL (ref 8.4–10.5)
Potassium: 5 mEq/L (ref 3.5–5.1)
Sodium: 138 mEq/L (ref 135–145)
Total Protein: 6.4 g/dL (ref 6.0–8.3)

## 2012-10-14 LAB — URINALYSIS, ROUTINE W REFLEX MICROSCOPIC
Glucose, UA: NEGATIVE mg/dL
Hgb urine dipstick: NEGATIVE
Specific Gravity, Urine: 1.01 (ref 1.005–1.030)
Urobilinogen, UA: 0.2 mg/dL (ref 0.0–1.0)
pH: 7 (ref 5.0–8.0)

## 2012-10-14 LAB — URINE MICROSCOPIC-ADD ON

## 2012-10-14 MED ORDER — DEXTROSE 5 % IV SOLN
1.0000 g | INTRAVENOUS | Status: DC
  Administered 2012-10-14: 1 g via INTRAVENOUS
  Filled 2012-10-14: qty 10

## 2012-10-14 MED ORDER — SODIUM CHLORIDE 0.9 % IV SOLN
INTRAVENOUS | Status: AC
  Administered 2012-10-14: 23:00:00 via INTRAVENOUS

## 2012-10-14 MED ORDER — ONDANSETRON HCL 4 MG/2ML IJ SOLN
4.0000 mg | Freq: Four times a day (QID) | INTRAMUSCULAR | Status: DC | PRN
  Administered 2012-10-15 – 2012-10-18 (×6): 4 mg via INTRAVENOUS
  Filled 2012-10-14 (×5): qty 2

## 2012-10-14 MED ORDER — SODIUM CHLORIDE 0.9 % IV SOLN
Freq: Once | INTRAVENOUS | Status: AC
  Administered 2012-10-14: 18:00:00 via INTRAVENOUS

## 2012-10-14 MED ORDER — IOHEXOL 300 MG/ML  SOLN
25.0000 mL | INTRAMUSCULAR | Status: DC
  Administered 2012-10-14: 25 mL via ORAL

## 2012-10-14 MED ORDER — ONDANSETRON HCL 4 MG/2ML IJ SOLN: 4.0000 mg | Freq: Three times a day (TID) | INTRAMUSCULAR | Status: DC | PRN

## 2012-10-14 MED ORDER — SODIUM CHLORIDE 0.9 % IV SOLN
INTRAVENOUS | Status: AC
  Administered 2012-10-15: 02:00:00 via INTRAVENOUS

## 2012-10-14 MED ORDER — ACETAMINOPHEN 650 MG RE SUPP
325.0000 mg | Freq: Once | RECTAL | Status: DC
  Filled 2012-10-14: qty 1

## 2012-10-14 MED ORDER — IOHEXOL 300 MG/ML  SOLN
100.0000 mL | Freq: Once | INTRAMUSCULAR | Status: AC | PRN
  Administered 2012-10-14: 100 mL via INTRAVENOUS

## 2012-10-14 MED ORDER — ONDANSETRON HCL 4 MG PO TABS: 4.0000 mg | ORAL_TABLET | Freq: Four times a day (QID) | ORAL | Status: DC | PRN

## 2012-10-14 MED ORDER — HYDROMORPHONE HCL PF 1 MG/ML IJ SOLN: 0.5000 mg | INTRAMUSCULAR | Status: DC | PRN

## 2012-10-14 MED ORDER — ONDANSETRON HCL 4 MG/2ML IJ SOLN
4.0000 mg | Freq: Once | INTRAMUSCULAR | Status: AC
  Administered 2012-10-14: 4 mg via INTRAVENOUS
  Filled 2012-10-14: qty 2

## 2012-10-14 MED ORDER — METRONIDAZOLE IN NACL 5-0.79 MG/ML-% IV SOLN
500.0000 mg | Freq: Three times a day (TID) | INTRAVENOUS | Status: DC
  Administered 2012-10-15 – 2012-10-20 (×17): 500 mg via INTRAVENOUS
  Filled 2012-10-14 (×21): qty 100

## 2012-10-14 MED ORDER — SODIUM CHLORIDE 0.9 % IV BOLUS (SEPSIS)
1000.0000 mL | Freq: Once | INTRAVENOUS | Status: AC
  Administered 2012-10-14: 1000 mL via INTRAVENOUS

## 2012-10-14 MED ORDER — CIPROFLOXACIN IN D5W 400 MG/200ML IV SOLN
400.0000 mg | Freq: Two times a day (BID) | INTRAVENOUS | Status: DC
  Administered 2012-10-15 – 2012-10-20 (×12): 400 mg via INTRAVENOUS
  Filled 2012-10-14 (×13): qty 200

## 2012-10-14 MED ORDER — METRONIDAZOLE IN NACL 5-0.79 MG/ML-% IV SOLN
500.0000 mg | Freq: Once | INTRAVENOUS | Status: AC
  Administered 2012-10-14: 500 mg via INTRAVENOUS
  Filled 2012-10-14: qty 100

## 2012-10-14 MED ORDER — ACETAMINOPHEN 650 MG RE SUPP
650.0000 mg | Freq: Once | RECTAL | Status: AC
  Administered 2012-10-14: 650 mg via RECTAL

## 2012-10-14 NOTE — ED Provider Notes (Addendum)
History     CSN: [id number]  Arrival date & time 10/14/12  1639   First MD Initiated Contact with Patient 10/14/12 1641      Chief Complaint  Patient presents with  . Nausea  . Emesis  . Diarrhea    (Consider location/radiation/quality/duration/timing/severity/associated sxs/prior treatment) Patient is a 79 y.o. female presenting with vomiting and diarrhea. The history is provided by the patient.  Emesis Associated symptoms: diarrhea   Diarrhea Associated symptoms: vomiting   She was transferred to her from a nursing home because of vomiting and diarrhea. Patient states that she does not know why she is here and denies nausea or diarrhea. There's been no known fever. She's not complaining of abdominal pain.  Past Medical History  Diagnosis Date  . HYPONATREMIA 01/01/2009  . ANEMIA 08/20/2009  . LEUKOCYTOSIS 08/20/2009  . DEPRESSION, MAJOR, RECURRENT 02/01/2009  . Unspecified hearing loss 11/17/2008  . HYPERTENSION 11/17/2008  . TEMPORAL ARTERITIS 08/20/2009  . PYELONEPHRITIS 09/10/2009  . Cellulitis and abscess of upper arm and forearm 04/16/2010  . PRESSURE ULCER BUTTOCK 02/01/2009  . BACK PAIN, LUMBAR 07/29/2010  . WEAKNESS 01/01/2009  . Anorexia 12/19/2008  . Headache 04/18/2010  . DIARRHEA, CHRONIC 12/19/2008  . Stress incontinence   . Hx: UTI (urinary tract infection)   . Blood transfusion   . Foot drop, bilateral   . Clostridium difficile colitis     History of  . Pancreatitis     History of  . Anxiety   . DEGENERATIVE JOINT DISEASE 11/17/2008    shoulders, arms  . History of skin cancer   . Dysrhythmia     Hx of paroxysmal a-fib  . H/O psoriatic arthritis   . History of SIADH   . H/O hypokalemia   . H/O mitral valve prolapse   . 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 Cholecystectomy    . Abdominal hysterectomy    . Lumbar laminectomy    . Temporal artery biopsy / ligation   06/04/2009  . Right cataract extraction      Family History  Problem Relation Age of Onset  . Cancer Neg Hx     family  . Hypertension Neg Hx     family  . Arthritis Neg Hx     family    History  Substance Use Topics  . Smoking status: Never Smoker   . Smokeless tobacco: Never Used  . Alcohol Use: No    OB History   Grav Para Term Preterm Abortions TAB SAB Ect Mult Living                  Review of Systems  Unable to perform ROS: Dementia  Gastrointestinal: Positive for vomiting and diarrhea.    Allergies  Benazepril hcl; Epinephrine hcl; and Lidocaine  Home Medications   Current Outpatient Rx  Name  Route  Sig  Dispense  Refill  . amLODipine (NORVASC) 10 MG tablet   Oral   Take 1 tablet (10 mg total) by mouth daily.   30 tablet   11   . Calcium Carbonate-Vitamin D (CALCIUM-VITAMIN D) 500-200 MG-UNIT per tablet   Oral   Take 1 tablet by mouth 2 (two) times daily with a meal.   60 tablet   11   . cloNIDine (CATAPRES) 0.1 MG tablet   Oral   Take 1 tablet (0.1 mg total) by mouth daily.  90 tablet   3   . diazepam (VALIUM) 5 MG tablet   Oral   Take 1 tablet (5 mg total) by mouth every 12 (twelve) hours as needed. anxiety   30 tablet   5   . docusate sodium (COLACE) 100 MG capsule   Oral   Take 100 mg by mouth every 12 (twelve) hours.         . gabapentin (NEURONTIN) 300 MG capsule   Oral   Take 1 capsule (300 mg total) by mouth 2 (two) times daily.   60 capsule   5   . leflunomide (ARAVA) 10 MG tablet   Oral   Take 15 mg by mouth daily.         Marland Kitchen lisinopril (PRINIVIL,ZESTRIL) 40 MG tablet   Oral   Take 1 tablet (40 mg total) by mouth daily.   90 tablet   3   . loperamide (IMODIUM) 2 MG capsule   Oral   Take 2 mg by mouth 4 (four) times daily as needed. For diarrhea         . Multiple Vitamins-Minerals (PRESERVISION AREDS) CAPS      14320-226 capsule, take 2 caps by mouth twice daily   120 capsule   11   . Multiple  Vitamins-Minerals (THERA M PLUS PO)   Oral   Take by mouth as needed.         . ondansetron (ZOFRAN) 8 MG tablet   Oral   Take 8 mg by mouth every 8 (eight) hours as needed. For nausea         . oxyCODONE-acetaminophen (PERCOCET) 10-325 MG per tablet   Oral   Take 1 tablet by mouth every 6 (six) hours.   120 tablet   0   . polyethylene glycol (MIRALAX / GLYCOLAX) packet   Oral   Take 17 g by mouth daily.         . predniSONE (DELTASONE) 10 MG tablet   Oral   Take 1 tablet (10 mg total) by mouth daily.   30 tablet   11   . ranitidine (ZANTAC) 150 MG tablet   Oral   Take 1 tablet (150 mg total) by mouth 2 (two) times daily. For reflux.   60 tablet   2   . senna (SENOKOT) 8.6 MG tablet   Oral   Take 1 tablet (8.6 mg total) by mouth daily.   30 tablet   11   . sertraline (ZOLOFT) 50 MG tablet   Oral   Take 1.5 tablets (75 mg total) by mouth daily.   45 tablet   11   . Skin Protectants, Misc. (DIMETHICONE-ZINC OXIDE) cream   Topical   Apply 1 application topically 2 (two) times daily. For redness on buttocks.           BP 103/87  Pulse 91  Temp(Src) 97.5 F (36.4 C) (Rectal)  Resp 19  SpO2 100%  Physical Exam  Nursing note and vitals reviewed.  79 year old female, resting comfortably and in no acute distress. Vital signs are normal. Oxygen saturation is 100%, which is normal. Head is normocephalic and atraumatic. PERRLA, EOMI. Oropharynx is clear. Neck is nontender and supple without adenopathy or JVD. Back is nontender and there is no CVA tenderness. Lungs are clear without rales, wheezes, or rhonchi. Chest is nontender. Heart has regular rate and rhythm without murmur. Abdomen is soft, flat, with diffuse tenderness to palpation and also tenderness to percussion. There is  no definite rebound tenderness or guarding. There are no masses or hepatosplenomegaly and peristalsis is hypoactive. Extremities have no cyanosis or edema, full range of motion  is present. Skin is warm and dry without rash. Neurologic: She is oriented to person and place but not time, cranial nerves are intact, there are no motor or sensory deficits.  ED Course  Procedures (including critical care time)  Results for orders placed during the hospital encounter of 10/14/12  CBC WITH DIFFERENTIAL      Result Value Range   WBC 26.6 (*) 4.0 - 10.5 K/uL   RBC 4.28  3.87 - 5.11 MIL/uL   Hemoglobin 12.7  12.0 - 15.0 g/dL   HCT 45.4  09.8 - 11.9 %   MCV 93.2  78.0 - 100.0 fL   MCH 29.7  26.0 - 34.0 pg   MCHC 31.8  30.0 - 36.0 g/dL   RDW 14.7  82.9 - 56.2 %   Platelets 375  150 - 400 K/uL   Neutrophils Relative 91 (*) 43 - 77 %   Lymphocytes Relative 3 (*) 12 - 46 %   Monocytes Relative 6  3 - 12 %   Eosinophils Relative 0  0 - 5 %   Basophils Relative 0  0 - 1 %   Neutro Abs 24.2 (*) 1.7 - 7.7 K/uL   Lymphs Abs 0.8  0.7 - 4.0 K/uL   Monocytes Absolute 1.6 (*) 0.1 - 1.0 K/uL   Eosinophils Absolute 0.0  0.0 - 0.7 K/uL   Basophils Absolute 0.0  0.0 - 0.1 K/uL   WBC Morphology INCREASED BANDS (>20% BANDS)    COMPREHENSIVE METABOLIC PANEL      Result Value Range   Sodium 138  135 - 145 mEq/L   Potassium 5.0  3.5 - 5.1 mEq/L   Chloride 100  96 - 112 mEq/L   CO2 21  19 - 32 mEq/L   Glucose, Bld 180 (*) 70 - 99 mg/dL   BUN 27 (*) 6 - 23 mg/dL   Creatinine, Ser 1.30  0.50 - 1.10 mg/dL   Calcium 9.3  8.4 - 86.5 mg/dL   Total Protein 6.4  6.0 - 8.3 g/dL   Albumin 3.5  3.5 - 5.2 g/dL   AST 51 (*) 0 - 37 U/L   ALT 22  0 - 35 U/L   Alkaline Phosphatase 101  39 - 117 U/L   Total Bilirubin 0.4  0.3 - 1.2 mg/dL   GFR calc non Af Amer 49 (*) >90 mL/min   GFR calc Af Amer 56 (*) >90 mL/min  LIPASE, BLOOD      Result Value Range   Lipase 134 (*) 11 - 59 U/L  URINALYSIS, ROUTINE W REFLEX MICROSCOPIC      Result Value Range   Color, Urine YELLOW  YELLOW   APPearance CLOUDY (*) CLEAR   Specific Gravity, Urine 1.010  1.005 - 1.030   pH 7.0  5.0 - 8.0   Glucose, UA  NEGATIVE  NEGATIVE mg/dL   Hgb urine dipstick NEGATIVE  NEGATIVE   Bilirubin Urine NEGATIVE  NEGATIVE   Ketones, ur NEGATIVE  NEGATIVE mg/dL   Protein, ur NEGATIVE  NEGATIVE mg/dL   Urobilinogen, UA 0.2  0.0 - 1.0 mg/dL   Nitrite POSITIVE (*) NEGATIVE   Leukocytes, UA SMALL (*) NEGATIVE  URINE MICROSCOPIC-ADD ON      Result Value Range   Squamous Epithelial / LPF RARE  RARE   WBC, UA 7-10  <  3 WBC/hpf   Bacteria, UA MANY (*) RARE  CG4 I-STAT (LACTIC ACID)      Result Value Range   Lactic Acid, Venous 6.46 (*) 0.5 - 2.2 mmol/L     1. Abdominal pain   2. Urinary tract infection   3. Pancreatitis   4. Leukocytosis   5. Elevated lactic acid level     CRITICAL CARE Performed by: YQMVH,QIONG   Total critical care time: 85 minutes  Critical care time was exclusive of separately billable procedures and treating other patients.  Critical care was necessary to treat or prevent imminent or life-threatening deterioration.  Critical care was time spent personally by me on the following activities: development of treatment plan with patient and/or surrogate as well as nursing, discussions with consultants, evaluation of patient's response to treatment, examination of patient, obtaining history from patient or surrogate, ordering and performing treatments and interventions, ordering and review of laboratory studies, ordering and review of radiographic studies, pulse oximetry and re-evaluation of patient's condition.   MDM  Abdominal tenderness of uncertain cause. Given her inability to articulate any history and her age, and CT scan will be obtained.  Urinalysis is come back positive for UTI and this may be the cause of some of her symptoms. She's given a dose of Rocephin. WBC is come back very high at over 26,000. CT scan is still pending. While awaiting for CT, her blood pressure dropped down to 90 so she is given aggressive IV hydration.   Lactic acid has come back markedly elevated  confirming need for aggressive IV hydration.  Dione Booze, MD 10/14/12 1919  CT shows severe colitis without evidence of ischemic bowel. Images were viewed by me and discussed with radiologist. I have also talked with her power of attorney who confirms that she is DO NOT RESUSCITATE and does not wish to be on life-support. Because of diagnosis of colitis, metronidazole is added to her antibiotic regimen. Case is discussed with Dr. Onalee Hua of [facility] who agrees to admit the patient.Dione Booze, MD 10/14/12 2129

## 2012-10-14 NOTE — ED Notes (Signed)
Pt undressed, in gown, on monitor, continuous pulse oximetry, blood pressure cuff and oxygen [place] (3L) 

## 2012-10-14 NOTE — ED Notes (Signed)
Lauren, RN and myself cleaned pt, placed chuks underneath pt and a clean dry diaper on pt; warm blankets given

## 2012-10-14 NOTE — ED Notes (Signed)
Report called to floor nurse, Aundra Millet, RN. Nurse has no further questions upon report given. Pt being prepared for transport to floor.

## 2012-10-14 NOTE — ED Notes (Signed)
Per EMS: pt from nursing home and staff at [facility] states she has had n/v/d onset today about 1 hour ago. Pt denies pain. Pt alert and oriented. BP coming into ED 90/50 HR 80. Pt states some pain/sore in jaws.

## 2012-10-14 NOTE — ED Notes (Signed)
Tarry Kos, MD at bedside and informed about pts condition and vital signs

## 2012-10-14 NOTE — H&P (Signed)
PCP:   Kristian Covey, MD   Chief Complaint:  abd pain  HPI: 79 yo female from snf comes in with abd pain and worsening mental status.  Son is with her at bedside.  Normally on good days she can get into a wheelchair and knows who he is.  Today she started having n/v/d and abd pain with confusion.  Also fever.  He last spoke to her yesterday and she was in her normal state of health and mental status which is with some mild memory loss.  Her mental status has gotten worse over the last several hours in the ED has not gotten any narcotics or ativan.  Ct scan shows significant colitis.  Review of Systems:  Positive and negative as per HPI otherwise all other systems are negative  Past Medical History: Past Medical History  Diagnosis Date  . HYPONATREMIA 01/01/2009  . ANEMIA 08/20/2009  . LEUKOCYTOSIS 08/20/2009  . DEPRESSION, MAJOR, RECURRENT 02/01/2009  . Unspecified hearing loss 11/17/2008  . HYPERTENSION 11/17/2008  . TEMPORAL ARTERITIS 08/20/2009  . PYELONEPHRITIS 09/10/2009  . Cellulitis and abscess of upper arm and forearm 04/16/2010  . PRESSURE ULCER BUTTOCK 02/01/2009  . BACK PAIN, LUMBAR 07/29/2010  . WEAKNESS 01/01/2009  . Anorexia 12/19/2008  . Headache 04/18/2010  . DIARRHEA, CHRONIC 12/19/2008  . Stress incontinence   . Hx: UTI (urinary tract infection)   . Blood transfusion   . Foot drop, bilateral   . Clostridium difficile colitis     History of  . Pancreatitis     History of  . Anxiety   . DEGENERATIVE JOINT DISEASE 11/17/2008    shoulders, arms  . History of skin cancer   . Dysrhythmia     Hx of paroxysmal a-fib  . H/O psoriatic arthritis   . History of SIADH   . H/O hypokalemia   . H/O mitral valve prolapse   . 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 Cholecystectomy    . Abdominal hysterectomy    . Lumbar laminectomy    . Temporal artery biopsy / ligation  06/04/2009  . Right  cataract extraction      Medications: Prior to Admission medications   Medication Sig Start Date End Date Taking? Authorizing Provider  amLODipine (NORVASC) 10 MG tablet Take 1 tablet (10 mg total) by mouth daily. 04/02/12  Yes Kristian Covey, MD  Calcium Carbonate-Vitamin D (CALCIUM-VITAMIN D) 500-200 MG-UNIT per tablet Take 1 tablet by mouth 2 (two) times daily with a meal. 09/15/11  Yes Kristian Covey, MD  cloNIDine (CATAPRES) 0.1 MG tablet Take 1 tablet (0.1 mg total) by mouth daily. 11/06/11  Yes Kristian Covey, MD  diazepam (VALIUM) 5 MG tablet Take 1 tablet (5 mg total) by mouth every 12 (twelve) hours as needed. anxiety 02/06/12  Yes Kristian Covey, MD  docusate sodium (COLACE) 100 MG capsule Take 100 mg by mouth every 12 (twelve) hours.   Yes Historical Provider, MD  gabapentin (NEURONTIN) 300 MG capsule Take 1 capsule (300 mg total) by mouth 2 (two) times daily. 08/31/12 08/31/13 Yes Kristian Covey, MD  leflunomide (ARAVA) 10 MG tablet Take 15 mg by mouth daily.   Yes Historical Provider, MD  lisinopril (PRINIVIL,ZESTRIL) 40 MG tablet Take 1 tablet (40 mg total) by mouth daily. 11/06/11  Yes Kristian Covey, MD  loperamide (IMODIUM) 2 MG capsule Take 2 mg by mouth 4 (  four) times daily as needed. For diarrhea   Yes Historical Provider, MD  Multiple Vitamins-Minerals (PRESERVISION AREDS) CAPS Take 2 capsules by mouth 2 (two) times daily. 03/22/12  Yes Kristian Covey, MD  Multiple Vitamins-Minerals (THERA M PLUS PO) Take 1 tablet by mouth daily.    Yes Historical Provider, MD  oxyCODONE-acetaminophen (PERCOCET) 10-325 MG per tablet Take 1 tablet by mouth every 6 (six) hours. 10/08/12 10/18/12 Yes Stacie Glaze, MD  polyethylene glycol [facility] Ethelene Hal) packet Take 17 g by mouth daily.   Yes Historical Provider, MD  predniSONE (DELTASONE) 10 MG tablet Take 1 tablet (10 mg total) by mouth daily. 11/13/11  Yes Kristian Covey, MD  promethazine (PHENERGAN) 25 MG tablet Take 25 mg  by mouth every 6 (six) hours as needed for nausea. For nausea   Yes Historical Provider, MD  ranitidine (ZANTAC) 150 MG tablet Take 1 tablet (150 mg total) by mouth 2 (two) times daily. For reflux. 08/23/12  Yes Kristian Covey, MD  senna (SENOKOT) 8.6 MG tablet Take 1 tablet (8.6 mg total) by mouth daily. 11/17/11  Yes Kristian Covey, MD  sertraline (ZOLOFT) 50 MG tablet Take 1.5 tablets (75 mg total) by mouth daily. 12/23/11  Yes Kristian Covey, MD  Skin Protectants, Misc. (DIMETHICONE-ZINC OXIDE) cream Apply 1 application topically 2 (two) times daily. For redness on buttocks.   Yes Historical Provider, MD    Allergies:   Allergies  Allergen Reactions  . Benazepril Hcl     REACTION: cough  . Epinephrine Hcl     REACTION: shakes  . Lidocaine Other (See Comments)    Per MAR    Social History:  reports that she has never smoked. She has never used smokeless tobacco. She reports that she does not drink alcohol or use illicit drugs.  Family History: Family History  Problem Relation Age of Onset  . Cancer Neg Hx     family  . Hypertension Neg Hx     family  . Arthritis Neg Hx     family    Physical Exam: Filed Vitals:   10/14/12 1721 10/14/12 1758 10/14/12 1830 10/14/12 2055  BP: 143/107 104/76 103/87 118/56  Pulse: 96 94 91 87  Temp: 97.5 F (36.4 C)     TempSrc: Rectal     Resp: 16 20 19 24   SpO2: 99% 99% 100% 95%   General appearance: delirious, no distress, slowed mentation and toxic Neck: no JVD and supple, symmetrical, trachea midline Lungs: clear to auscultation bilaterally Heart: regular rate and rhythm, S1, S2 normal, no murmur, click, rub or gallop Abdomen: soft, non-tender; bowel sounds normal; no masses,  no organomegaly Extremities: extremities normal, atraumatic, no cyanosis or edema Pulses: 2+ and symmetric Skin: Skin color, texture, turgor normal. No rashes or lesions Neurologic: Grossly normal  No focal deficits    Labs on Admission:    Recent Labs  10/14/12 1743  NA 138  K 5.0  CL 100  CO2 21  GLUCOSE 180*  BUN 27*  CREATININE 1.02  CALCIUM 9.3    Recent Labs  10/14/12 1743  AST 51*  ALT 22  ALKPHOS 101  BILITOT 0.4  PROT 6.4  ALBUMIN 3.5    Recent Labs  10/14/12 1743  LIPASE 134*    Recent Labs  10/14/12 1743  WBC 26.6*  NEUTROABS 24.2*  HGB 12.7  HCT 39.9  MCV 93.2  PLT 375    Radiological Exams on Admission: Ct Abdomen Pelvis  W Contrast  10/14/2012  *RADIOLOGY REPORT*  Clinical Data: Vomiting and diarrhea.  CT ABDOMEN AND PELVIS WITH CONTRAST  Technique:  Multidetector CT imaging of the abdomen and pelvis was performed following the standard protocol during bolus administration of intravenous contrast.  Contrast: OMNIPAQUE IOHEXOL 300 MG/ML  SOLN  Comparison: 11/08/2010.  Findings: Basilar atelectasis/scarring greater on the left.  Diffuse inflammation of the colon extending from the distal transverse colon to the rectum consistent with prominent colitis without evidence of free intraperitoneal air.  The cause of this is indeterminate.  Gas seen within this portion colon appears to be within the lumen rather than within the bowel wall as may be seen with ischemic colitis.  The patient has an elevated white count therefore this may be related to infectious colitis.  Significant atherosclerotic type changes of the aorta and aorta branch vessel and therefore ischemic colitis not entirely excluded.  Mild prominence intrahepatic/extrahepatic biliary ducts without focal hepatic lesion. This may be related to patient's age and post cholecystectomy state.  Coarse calcification within the spleen.  Multiple renal lesions some of which are cysts others too small to adequately characterize.  No hydronephrosis.  Mild prominence of pancreatic duct without mass identified.  Prominent atherosclerotic type changes of the abdominal aorta with ectasia and narrowing but without aneurysmal dilation.  Atherosclerotic type changes with plaque causing narrowing of portions of the celiac artery, superior mesenteric artery, renal arteries and inferior mesenteric artery.  Prominent plaque with prominent narrowing proximal common iliac arteries.  Scoliosis and degenerative changes lower thoracic and lumbar spine with disc space narrowing at several levels without focal compression fracture.  Prior left hip replacement.  Right hip joint degenerative changes.  Partially decompressed urinary bladder without gross abnormality.  Cardiomegaly.  Coronary artery calcifications.  Calcification aortic valve and mitral valve.  Contrast within a patulous distal esophagus may represent reflux. Left external iliac adenopathy measuring 2.1 x 1.3 cm  IMPRESSION: Significant colitis extends from the distal transverse colon to the rectum.  Please see above.  Critical Value/emergent results were called by telephone at the time of interpretation on 10/14/2012 at 8:54 p.m. to Dr. Preston Fleeting., who verbally acknowledged these results.   Original Report Authenticated By: Lacy Duverney, M.D.     Assessment/Plan 79 yo female with acute colitis and sepsis  Principal Problem:   Sepsis Active Problems:   HYPERTENSION   DIARRHEA, CHRONIC   Acute colitis   Abdominal pain, acute   History of SIADH  Pt is confirmed DNR but son wishes treatment with ivf, abx.  Place on iv flagyl and cipro.  Ck cdiff.  Ivf.  Prn pain meds and zofran.  Neuro cks.  Son is aware of the severity of her illness, if no improvement with abx in the next 48 hours consider comfort care.  Place in stepdown.  Blood cultures done.  Ck procalcitonin.  DNR.  Cailen Mihalik A 10/14/2012, 9:33 PM

## 2012-10-15 DIAGNOSIS — N179 Acute kidney failure, unspecified: Secondary | ICD-10-CM | POA: Diagnosis present

## 2012-10-15 DIAGNOSIS — R8290 Unspecified abnormal findings in urine: Secondary | ICD-10-CM | POA: Diagnosis present

## 2012-10-15 DIAGNOSIS — E860 Dehydration: Secondary | ICD-10-CM | POA: Diagnosis present

## 2012-10-15 DIAGNOSIS — R197 Diarrhea, unspecified: Secondary | ICD-10-CM

## 2012-10-15 DIAGNOSIS — N170 Acute kidney failure with tubular necrosis: Secondary | ICD-10-CM | POA: Diagnosis present

## 2012-10-15 DIAGNOSIS — E872 Acidosis: Secondary | ICD-10-CM | POA: Diagnosis present

## 2012-10-15 DIAGNOSIS — J9601 Acute respiratory failure with hypoxia: Secondary | ICD-10-CM | POA: Diagnosis present

## 2012-10-15 LAB — CBC
HCT: 34.7 % — ABNORMAL LOW (ref 36.0–46.0)
MCH: 29.4 pg (ref 26.0–34.0)
MCV: 91.1 fL (ref 78.0–100.0)
RBC: 3.81 MIL/uL — ABNORMAL LOW (ref 3.87–5.11)
WBC: 17.5 10*3/uL — ABNORMAL HIGH (ref 4.0–10.5)

## 2012-10-15 LAB — COMPREHENSIVE METABOLIC PANEL
AST: 55 U/L — ABNORMAL HIGH (ref 0–37)
Albumin: 2.7 g/dL — ABNORMAL LOW (ref 3.5–5.2)
Alkaline Phosphatase: 78 U/L (ref 39–117)
BUN: 30 mg/dL — ABNORMAL HIGH (ref 6–23)
Creatinine, Ser: 1.03 mg/dL (ref 0.50–1.10)
Potassium: 4.3 mEq/L (ref 3.5–5.1)
Total Protein: 5.3 g/dL — ABNORMAL LOW (ref 6.0–8.3)

## 2012-10-15 LAB — OCCULT BLOOD X 1 CARD TO LAB, STOOL: Fecal Occult Bld: POSITIVE — AB

## 2012-10-15 LAB — PROTIME-INR
INR: 1.11 (ref 0.00–1.49)
Prothrombin Time: 14.2 seconds (ref 11.6–15.2)

## 2012-10-15 LAB — LACTIC ACID, PLASMA: Lactic Acid, Venous: 1 mmol/L (ref 0.5–2.2)

## 2012-10-15 MED ORDER — BIOTENE DRY MOUTH MT LIQD
15.0000 mL | Freq: Two times a day (BID) | OROMUCOSAL | Status: DC
  Administered 2012-10-15 – 2012-10-22 (×15): 15 mL via OROMUCOSAL

## 2012-10-15 NOTE — Progress Notes (Signed)
[facility] Progress Note [place] TEAM 1 - Stepdown/ICU TEAM   Jo Alvarez ZOX:[id number] DOB: [birth year] DOA: 10/14/2012 PCP: Kristian Covey, MD  Brief narrative: 79 year old female patient, resident of skilled nursing facility. Sent to ER because of abdominal pain and progressive alteration in mental status. Developed nausea, vomiting, diarrhea and abdominal pain with confusion and fever on the date of admission. Apparently was in her normal state of health 24 hours prior. Evaluation in the ER revealed significant leukocytosis with a white count greater than 25,000. CT abdomen and pelvis revealed significant distal colitis. Patient also had significant metabolic acidosis and acute renal failure. She was subsequently admitted to the step down unit.  Assessment/Plan: Principal Problem:   Sepsis -Likely secondary to acute colitis; has associated acidosis and elevated Procalcitonin level -Continue supportive care and broad-spectrum empiric antibiotics to cover enteric pathogens  Active Problems:  Acute colitis (distal transverse to rectum)/  DIARRHEA, CHRONIC -differential infectious vs. Ischemic -heme positive but no brisk bleeding and is associated w/significant abdominal pain so leaning toward infection although intial c diff was negative -rpt. Lactic acid now normal so doubt mesenteric ischemia -cont. Empiric anbx coverage for enteric pathogens -allow clears     Lactic acidosis/Dehydration -r/t dirrhea/colitis plus degree 2/2 ARF -cont. IVF's    LEUKOCYTOSIS -due to hypoperfusion and colitis; +/- UTI -trend is down    Acute renal failure/Acute tubular necrosis -due to dehydration (prerenal) -was on ACE I preadmit so suspect ATN -follow lytes    Abnormal urinalysis -suspect abnormality due to dehydration -follow up on cx -cipro should cover UTI so dc Rocephin    HYPERTENSION  -BP soft- home meds held    History of SIADH -not on meds PTA    Acute  respiratory failure with hypoxia -CXR normal-wean oxygen   History temporal arteritis -On chronic steroids- if BP drops rec.check random cortisol -HOLD for now since dose low   DVT prophylaxis: SCDs Code Status: DO NOT RESUSCITATE Family Communication: Patient-updated son [name] via phone 2/14 Disposition Plan: Step down  Consultants: None  Procedures: None  Antibiotics: Rocephin 2/13 >>> 2/14 Flagyl 2/13 >>> 2/14 Cipro 2/13 >>> 2/14  HPI/Subjective: Patient awake but sleepy. Endorses thirst. Continued abdominal pain. No shortness of breath or chest pain.   Objective: Blood pressure 118/100, pulse 90, temperature 100.2 F (37.9 C), temperature source Axillary, resp. rate 21, height 5\' 4"  (1.626 m), weight 54.2 kg (119 lb 7.8 oz), SpO2 98.00%.  Intake/Output Summary (Last 24 hours) at 10/15/12 1055 Last data filed at 10/15/12 0700  Gross per 24 hour  Intake   1590 ml  Output      0 ml  Net   1590 ml     Exam: General: No acute respiratory distress Lungs: Clear to auscultation bilaterally without wheezes or crackles, nasal cannula oxygen Cardiovascular: Regular rate and rhythm without murmur gallop or rub normal S1 and S2, no peripheral edema, no JVD, IV fluids @ 150/hr Abdomen: Focally tender with guarding left side especially left mid and lower, nondistended, soft, bowel sounds positive, no rebound, no ascites, no appreciable mass Musculoskeletal: No significant cyanosis, clubbing of bilateral lower extremities Neurological: Arousable and able to verbally communicate but still sleepy, moves all extremities x4, no appreciable focal neurological deficits, cranial nerves II through XII grossly intact  Data Reviewed: Basic Metabolic Panel:  Recent Labs Lab 10/14/12 1743 10/15/12 0445  NA 138 135  K 5.0 4.3  CL 100 103  CO2 21 24  GLUCOSE 180* 121*  BUN  27* 30*  CREATININE 1.02 1.03  CALCIUM 9.3 7.5*   Liver Function Tests:  Recent Labs Lab 10/14/12 1743  10/15/12 0445  AST 51* 55*  ALT 22 21  ALKPHOS 101 78  BILITOT 0.4 0.2*  PROT 6.4 5.3*  ALBUMIN 3.5 2.7*    Recent Labs Lab 10/14/12 1743  LIPASE 134*   No results found for this basename: AMMONIA,  in the last 168 hours CBC:  Recent Labs Lab 10/14/12 1743 10/15/12 0445  WBC 26.6* 17.5*  NEUTROABS 24.2*  --   HGB 12.7 11.2*  HCT 39.9 34.7*  MCV 93.2 91.1  PLT 375 297   Cardiac Enzymes: No results found for this basename: CKTOTAL, CKMB, CKMBINDEX, TROPONINI,  in the last 168 hours BNP (last 3 results) No results found for this basename: PROBNP,  in the last 8760 hours CBG: No results found for this basename: GLUCAP,  in the last 168 hours  Recent Results (from the past 240 hour(s))  MRSA PCR SCREENING     Status: None   Collection Time    10/14/12 11:16 PM      Result Value Range Status   MRSA by PCR NEGATIVE  NEGATIVE Final   Comment:            The GeneXpert MRSA Assay (FDA     approved for NASAL specimens     only), is one component of a     comprehensive MRSA colonization     surveillance program. It is not     intended to diagnose MRSA     infection nor to guide or     monitor treatment for     MRSA infections.  CLOSTRIDIUM DIFFICILE BY PCR     Status: None   Collection Time    10/15/12 12:58 AM      Result Value Range Status   C difficile by pcr NEGATIVE  NEGATIVE Final     Studies:  Recent x-ray studies have been reviewed in detail by the Attending Physician  Scheduled Meds:  Reviewed in detail by the Attending Physician   Junious Silk, ANP [facility] Office  [phone] Pager [phone]  On-Call/Text Page:      [email or web address]      password [email or web address]  If 7PM-7AM, please contact night-coverage [email or web address] Password [facility] 10/15/2012, 10:55 AM   LOS: 1 day   I have examined the patient, reviewed the chart and modified the above note which I agree with.   Antonina Deziel,MD [phone] 10/16/2012, 8:13 AM

## 2012-10-15 NOTE — Progress Notes (Signed)
Utilization Review Completed.   Kyeshia Zinn, RN, BSN Nurse Case Manager  [phone]  

## 2012-10-15 NOTE — Progress Notes (Signed)
INITIAL NUTRITION ASSESSMENT  DOCUMENTATION CODES Per approved criteria  -Not Applicable   INTERVENTION: 1.  Modify diet; per MD discretion and based on GOC.  NUTRITION DIAGNOSIS: Inadequate oral intake related to AMS as evidenced by pt NPO.   Monitor:  1.  Food/beverage; diet advancement as able. 2.  GOC; for appropriate nutrition-related interventions  Reason for Assessment: Low Braden  79 y.o. female  Admitting Dx: Sepsis  ASSESSMENT: Pt admitted with AMS and abdominal pain.  Pt is oriented to self at baseline, with limited mobility via wheelchair.  Pt is from SNF. CT shows significant colitis and sepsis.   Per chart review, pt wt appears to be most recently stable at 130 lbs- last weighed in Feb of 2013.  Pt now 119 lbs representing a loss of 8.4% in 1 year.   Per MD note, current plan is to treat with fluids and abx with close monitoring.  Ongoing assessment of GOC based on progress.   Height: Ht Readings from Last 1 Encounters:  10/14/12 5\' 4"  (1.626 m)    Weight: Wt Readings from Last 1 Encounters:  10/15/12 119 lb 7.8 oz (54.2 kg)    Ideal Body Weight: 120 lbs  % Ideal Body Weight: 99%  Wt Readings from Last 10 Encounters:  10/15/12 119 lb 7.8 oz (54.2 kg)  03/02/12 122 lb (55.339 kg)  02/25/12 121 lb (54.885 kg)  10/24/11 127 lb (57.607 kg)  10/06/11 132 lb (59.875 kg)  07/25/11 125 lb (56.7 kg)  03/26/11 128 lb (58.06 kg)  12/19/10 133 lb (60.328 kg)  09/20/10 139 lb (63.05 kg)  09/09/10 132 lb (59.875 kg)    Usual Body Weight: 130 lbs  % Usual Body Weight: 91%  BMI:  Body mass index is 20.5 kg/(m^2).  Estimated Nutritional Needs: Kcal: 1510-1620 Protein: 65-76g Fluid: >1.5 L/day  Skin: non-pitting edema  Diet Order: NPO  EDUCATION NEEDS: -Education not appropriate at this time   Intake/Output Summary (Last 24 hours) at 10/15/12 1103 Last data filed at 10/15/12 0700  Gross per 24 hour  Intake   1590 ml  Output      0 ml  Net    1590 ml    Last BM: 2/14  Labs:   Recent Labs Lab 10/14/12 1743 10/15/12 0445  NA 138 135  K 5.0 4.3  CL 100 103  CO2 21 24  BUN 27* 30*  CREATININE 1.02 1.03  CALCIUM 9.3 7.5*  GLUCOSE 180* 121*    CBG (last 3)  No results found for this basename: GLUCAP,  in the last 72 hours  Scheduled Meds: . sodium chloride   Intravenous STAT  . antiseptic oral rinse  15 mL Mouth Rinse BID  . ciprofloxacin  400 mg Intravenous Q12H  . metronidazole  500 mg Intravenous Q8H    Continuous Infusions:   Past Medical History  Diagnosis Date  . HYPONATREMIA 01/01/2009  . ANEMIA 08/20/2009  . LEUKOCYTOSIS 08/20/2009  . DEPRESSION, MAJOR, RECURRENT 02/01/2009  . Unspecified hearing loss 11/17/2008  . HYPERTENSION 11/17/2008  . TEMPORAL ARTERITIS 08/20/2009  . PYELONEPHRITIS 09/10/2009  . Cellulitis and abscess of upper arm and forearm 04/16/2010  . PRESSURE ULCER BUTTOCK 02/01/2009  . BACK PAIN, LUMBAR 07/29/2010  . WEAKNESS 01/01/2009  . Anorexia 12/19/2008  . Headache 04/18/2010  . DIARRHEA, CHRONIC 12/19/2008  . Stress incontinence   . Hx: UTI (urinary tract infection)   . Blood transfusion   . Foot drop, bilateral   . Clostridium difficile colitis  History of  . Pancreatitis     History of  . Anxiety   . DEGENERATIVE JOINT DISEASE 11/17/2008    shoulders, arms  . History of skin cancer   . Dysrhythmia     Hx of paroxysmal a-fib  . H/O psoriatic arthritis   . History of SIADH   . H/O hypokalemia   . H/O mitral valve prolapse   . 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 Cholecystectomy    . Abdominal hysterectomy    . Lumbar laminectomy    . Temporal artery biopsy / ligation  06/04/2009  . Right cataract extraction      Loyce Dys, MS RD LDN Clinical Inpatient Dietitian Pager: [phone] Weekend/After hours pager: [phone]

## 2012-10-16 LAB — URINE CULTURE

## 2012-10-16 LAB — BASIC METABOLIC PANEL
BUN: 18 mg/dL (ref 6–23)
Chloride: 108 mEq/L (ref 96–112)
GFR calc Af Amer: 90 mL/min — ABNORMAL LOW (ref >90)
Potassium: 3.5 mEq/L (ref 3.5–5.1)
Sodium: 141 mEq/L (ref 135–145)

## 2012-10-16 LAB — CBC
HCT: 31.5 % — ABNORMAL LOW (ref 36.0–46.0)
RDW: 14.5 % (ref 11.5–15.5)
WBC: 17.7 10*3/uL — ABNORMAL HIGH (ref 4.0–10.5)

## 2012-10-16 LAB — GLUCOSE, CAPILLARY: Glucose-Capillary: 139 mg/dL — ABNORMAL HIGH (ref 70–99)

## 2012-10-16 LAB — PROCALCITONIN: Procalcitonin: 4.67 ng/mL

## 2012-10-16 MED ORDER — LABETALOL HCL 5 MG/ML IV SOLN
20.0000 mg | Freq: Three times a day (TID) | INTRAVENOUS | Status: DC
  Administered 2012-10-16 – 2012-10-20 (×12): 20 mg via INTRAVENOUS
  Filled 2012-10-16 (×15): qty 4

## 2012-10-16 MED ORDER — PROMETHAZINE HCL 25 MG/ML IJ SOLN
12.5000 mg | Freq: Four times a day (QID) | INTRAMUSCULAR | Status: DC | PRN
  Administered 2012-10-16: 12:00:00 via INTRAVENOUS
  Administered 2012-10-16 – 2012-10-17 (×2): 12.5 mg via INTRAVENOUS
  Filled 2012-10-16 (×3): qty 1

## 2012-10-16 MED ORDER — CLONIDINE HCL 0.2 MG PO TABS
0.2000 mg | ORAL_TABLET | ORAL | Status: AC
  Filled 2012-10-16 (×2): qty 1

## 2012-10-16 MED ORDER — LABETALOL HCL 5 MG/ML IV SOLN
20.0000 mg | INTRAVENOUS | Status: DC | PRN
  Administered 2012-10-16 (×2): 20 mg via INTRAVENOUS
  Filled 2012-10-16 (×3): qty 4

## 2012-10-16 NOTE — Progress Notes (Addendum)
[facility] Progress Note [place] TEAM 1 - Stepdown/ICU TEAM   Jo Alvarez ZOX:[id number] DOB: 04-26-1925 DOA: 10/14/2012 PCP: Kristian Covey, MD  Brief narrative: 79 year old female patient, resident of skilled nursing facility. Sent to ER because of abdominal pain and progressive alteration in mental status. Developed nausea, vomiting, diarrhea and abdominal pain with confusion and fever on the date of admission. Apparently was in her normal state of health 24 hours prior. Evaluation in the ER revealed significant leukocytosis with a white count greater than 25,000. CT abdomen and pelvis revealed significant distal colitis. Patient also had significant metabolic acidosis and acute renal failure. She was subsequently admitted to the step down unit.  Assessment/Plan: Principal Problem:   Sepsis -Likely secondary to acute colitis; has associated acidosis and elevated Procalcitonin level -Continue supportive care and broad-spectrum empiric antibiotics to cover enteric pathogens  Active Problems:  Acute colitis (distal transverse to rectum) -likely infectious -heme positive but no brisk bleeding -rpt. Lactic acid improving -cont. Empiric anbx coverage for enteric pathogens -N.p.o. due to continued nausea     Lactic acidosis/Dehydration -r/t dirrhea/colitis  -cont. IVF's    LEUKOCYTOSIS -due to hypoperfusion and colitis; +/- UTI -trend is down  E. coli UTI -Sensitive to Cipro    HYPERTENSION  -BP rising-start labetalol IV 3 times a day due to inability to take by mouth medications    History of SIADH -not on meds PTA -Sodium was normal    Acute respiratory failure with hypoxia -CXR normal-wean oxygen   History temporal arteritis -On chronic steroids- if BP drops rec.check random cortisol -HOLD for now since dose low and has active infection   DVT prophylaxis: SCDs Code Status: DO NOT RESUSCITATE Family Communication: Patient-updated son [name] via phone  2/14 Disposition Plan: Step down  Consultants: None  Procedures: None  Antibiotics: Rocephin 2/13 >>> 2/14 Flagyl 2/13 >>> 2/14 Cipro 2/13 >>> 2/14  HPI/Subjective: Patient quite nauseated today. No significant abd pain.    Objective: Blood pressure 156/74, pulse 78, temperature 98.4 F (36.9 C), temperature source Axillary, resp. rate 16, height 5\' 4"  (1.626 m), weight 55 kg (121 lb 4.1 oz), SpO2 100.00%.  Intake/Output Summary (Last 24 hours) at 10/16/12 1216 Last data filed at 10/16/12 1018  Gross per 24 hour  Intake    560 ml  Output      0 ml  Net    560 ml     Exam: General: No acute respiratory distress Lungs: Clear to auscultation bilaterally without wheezes or crackles, nasal cannula oxygen Cardiovascular: Regular rate and rhythm without murmur gallop or rub normal S1 and S2, no peripheral edema, no JVD Abdomen: Focally tender with guarding left side especially left mid and lower, nondistended, soft, bowel sounds positive, no rebound, no ascites, no appreciable mass Musculoskeletal: No significant cyanosis, clubbing of bilateral lower extremities Neurological: Arousable and able to verbally communicate but still sleepy, moves all extremities x4, no appreciable focal neurological deficits, cranial nerves II through XII grossly intact  Data Reviewed: Basic Metabolic Panel:  Recent Labs Lab 10/14/12 1743 10/15/12 0445 10/16/12 1002  NA 138 135 141  K 5.0 4.3 3.5  CL 100 103 108  CO2 21 24 18*  GLUCOSE 180* 121* 90  BUN 27* 30* 18  CREATININE 1.02 1.03 0.68  CALCIUM 9.3 7.5* 7.9*   Liver Function Tests:  Recent Labs Lab 10/14/12 1743 10/15/12 0445  AST 51* 55*  ALT 22 21  ALKPHOS 101 78  BILITOT 0.4 0.2*  PROT 6.4 5.3*  ALBUMIN 3.5 2.7*    Recent Labs Lab 10/14/12 1743  LIPASE 134*   No results found for this basename: AMMONIA,  in the last 168 hours CBC:  Recent Labs Lab 10/14/12 1743 10/15/12 0445 10/16/12 1002  WBC 26.6* 17.5*  17.7*  NEUTROABS 24.2*  --   --   HGB 12.7 11.2* 10.2*  HCT 39.9 34.7* 31.5*  MCV 93.2 91.1 91.0  PLT 375 297 268   Cardiac Enzymes: No results found for this basename: CKTOTAL, CKMB, CKMBINDEX, TROPONINI,  in the last 168 hours BNP (last 3 results) No results found for this basename: PROBNP,  in the last 8760 hours CBG:  Recent Labs Lab 10/16/12 0141  GLUCAP 139*    Recent Results (from the past 240 hour(s))  URINE CULTURE     Status: None   Collection Time    10/14/12  5:22 PM      Result Value Range Status   Specimen Description URINE, CATHETERIZED   Final   Special Requests NONE   Final   Culture  Setup Time 10/14/2012 19:41   Final   Colony Count >=100,000 COLONIES/ML   Final   Culture ESCHERICHIA COLI   Final   Report Status PENDING   Incomplete  MRSA PCR SCREENING     Status: None   Collection Time    10/14/12 11:16 PM      Result Value Range Status   MRSA by PCR NEGATIVE  NEGATIVE Final   Comment:            The GeneXpert MRSA Assay (FDA     approved for NASAL specimens     only), is one component of a     comprehensive MRSA colonization     surveillance program. It is not     intended to diagnose MRSA     infection nor to guide or     monitor treatment for     MRSA infections.  CLOSTRIDIUM DIFFICILE BY PCR     Status: None   Collection Time    10/15/12 12:58 AM      Result Value Range Status   C difficile by pcr NEGATIVE  NEGATIVE Final     Studies:  Recent x-ray studies have been reviewed in detail by the Attending Physician  Scheduled Meds:  Reviewed in detail by the Attending Physician   LOS: 2 days   Olon Russ,MD [phone] 10/16/2012, 12:16 PM

## 2012-10-17 ENCOUNTER — Inpatient Hospital Stay (HOSPITAL_COMMUNITY): Payer: Medicare Other

## 2012-10-17 DIAGNOSIS — N179 Acute kidney failure, unspecified: Secondary | ICD-10-CM

## 2012-10-17 LAB — BASIC METABOLIC PANEL
BUN: 12 mg/dL (ref 6–23)
CO2: 19 mEq/L (ref 19–32)
Chloride: 105 mEq/L (ref 96–112)
Glucose, Bld: 93 mg/dL (ref 70–99)
Potassium: 2.8 mEq/L — ABNORMAL LOW (ref 3.5–5.1)
Sodium: 138 mEq/L (ref 135–145)

## 2012-10-17 LAB — CBC
HCT: 30.1 % — ABNORMAL LOW (ref 36.0–46.0)
Hemoglobin: 10 g/dL — ABNORMAL LOW (ref 12.0–15.0)
MCHC: 33.2 g/dL (ref 30.0–36.0)
RBC: 3.35 MIL/uL — ABNORMAL LOW (ref 3.87–5.11)
WBC: 16.7 10*3/uL — ABNORMAL HIGH (ref 4.0–10.5)

## 2012-10-17 LAB — POTASSIUM: Potassium: 2.8 mEq/L — ABNORMAL LOW (ref 3.5–5.1)

## 2012-10-17 MED ORDER — SODIUM CHLORIDE 0.9 % IV SOLN
INTRAVENOUS | Status: DC
  Administered 2012-10-17 (×2): via INTRAVENOUS
  Administered 2012-10-18: 1000 mL via INTRAVENOUS

## 2012-10-17 MED ORDER — CLONIDINE HCL 0.2 MG/24HR TD PTWK
0.2000 mg | MEDICATED_PATCH | TRANSDERMAL | Status: DC
  Administered 2012-10-17: 0.2 mg via TRANSDERMAL
  Filled 2012-10-17: qty 1

## 2012-10-17 MED ORDER — HYDRALAZINE HCL 20 MG/ML IJ SOLN
10.0000 mg | Freq: Four times a day (QID) | INTRAMUSCULAR | Status: DC | PRN
  Administered 2012-10-17: 5 mg via INTRAVENOUS
  Administered 2012-10-17 – 2012-10-19 (×6): 10 mg via INTRAVENOUS
  Filled 2012-10-17: qty 1
  Filled 2012-10-17: qty 0.5
  Filled 2012-10-17 (×6): qty 1

## 2012-10-17 MED ORDER — POTASSIUM CHLORIDE 10 MEQ/50ML IV SOLN
10.0000 meq | INTRAVENOUS | Status: AC
  Administered 2012-10-17 (×6): 10 meq via INTRAVENOUS
  Filled 2012-10-17 (×2): qty 50

## 2012-10-17 MED ORDER — HYDRALAZINE HCL 20 MG/ML IJ SOLN
5.0000 mg | Freq: Four times a day (QID) | INTRAMUSCULAR | Status: DC | PRN
  Administered 2012-10-17: 5 mg via INTRAVENOUS
  Filled 2012-10-17 (×2): qty 1

## 2012-10-17 MED ORDER — PROMETHAZINE HCL 25 MG/ML IJ SOLN
25.0000 mg | Freq: Four times a day (QID) | INTRAMUSCULAR | Status: DC
  Administered 2012-10-17 – 2012-10-18 (×5): 25 mg via INTRAVENOUS
  Filled 2012-10-17 (×8): qty 1

## 2012-10-17 NOTE — Progress Notes (Signed)
At 2200 BP was 197/72 and scheduled IV Labetolol 20mg  was given.  At midnight, BP is 173/59. Called and notified Lenny Pastel NP of same, and prn Hydralazine 5mg  iv was ordered and given at 0023.  Will continue to monitor pt.

## 2012-10-17 NOTE — Consult Note (Signed)
Subjective:   HPI  The patient is an 79 year old female who resides in a skilled nursing facility. She started to experience abdominal pain and had some alteration in mental status and therefore was brought to the emergency room here at the hospital. Evaluation was doneand she did have a leukocytosis, and a CT scan of the abdomen and pelvis revealed evidence of colitis from the distal transverse colon down to the rectum. The patient complains of generalized abdominal discomfort. She denies rectal bleeding.  Review of Systems No chest pain or shortness of breath  Past Medical History  Diagnosis Date  . HYPONATREMIA 01/01/2009  . ANEMIA 08/20/2009  . LEUKOCYTOSIS 08/20/2009  . DEPRESSION, MAJOR, RECURRENT 02/01/2009  . Unspecified hearing loss 11/17/2008  . HYPERTENSION 11/17/2008  . TEMPORAL ARTERITIS 08/20/2009  . PYELONEPHRITIS 09/10/2009  . Cellulitis and abscess of upper arm and forearm 04/16/2010  . PRESSURE ULCER BUTTOCK 02/01/2009  . BACK PAIN, LUMBAR 07/29/2010  . WEAKNESS 01/01/2009  . Anorexia 12/19/2008  . Headache 04/18/2010  . DIARRHEA, CHRONIC 12/19/2008  . Stress incontinence   . Hx: UTI (urinary tract infection)   . Blood transfusion   . Foot drop, bilateral   . Clostridium difficile colitis     History of  . Pancreatitis     History of  . Anxiety   . DEGENERATIVE JOINT DISEASE 11/17/2008    shoulders, arms  . History of skin cancer   . Dysrhythmia     Hx of paroxysmal a-fib  . H/O psoriatic arthritis   . History of SIADH   . H/O hypokalemia   . H/O mitral valve prolapse   . 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 Cholecystectomy    . Abdominal hysterectomy    . Lumbar laminectomy    . Temporal artery biopsy / ligation  06/04/2009  . Right cataract extraction     History   Social History  . Marital Status: Widowed    Spouse Name: N/A    Number of Children: N/A  . Years of  Education: N/A   Occupational History  . Not on file.   Social History Main Topics  . Smoking status: Never Smoker   . Smokeless tobacco: Never Used  . Alcohol Use: No  . Drug Use: No  . Sexually Active: No   Other Topics Concern  . Not on file   Social History Narrative  . No narrative on file   family history is negative for Cancer, and Hypertension, and Arthritis, . Current facility-administered medications:0.9 %  sodium chloride infusion, , Intravenous, Continuous, Calvert Cantor, MD, Last Rate: 100 mL/hr at 10/17/12 0900;  antiseptic oral rinse (BIOTENE) solution 15 mL, 15 mL, Mouth Rinse, BID, Saima Rizwan, MD, 15 mL at 10/17/12 0800;  ciprofloxacin (CIPRO) IVPB 400 mg, 400 mg, Intravenous, Q12H, Tarry Kos, MD, 400 mg at 10/17/12 1106 cloNIDine (CATAPRES - Dosed in mg/24 hr) patch 0.2 mg, 0.2 mg, Transdermal, Weekly, Calvert Cantor, MD, 0.2 mg at 10/17/12 0900;  hydrALAZINE (APRESOLINE) injection 10 mg, 10 mg, Intravenous, Q6H PRN, Rolan Lipa, NP, 10 mg at 10/17/12 0755;  HYDROmorphone (DILAUDID) injection 0.5 mg, 0.5 mg, Intravenous, Q2H PRN, Tarry Kos, MD labetalol (NORMODYNE,TRANDATE) injection 20 mg, 20 mg, Intravenous, TID, Calvert Cantor, MD, 20 mg at 10/17/12 1005;  metroNIDAZOLE (FLAGYL) IVPB 500 mg, 500 mg, Intravenous, Q8H, Tarry Kos, MD, 500 mg at 10/17/12 1418;  ondansetron (ZOFRAN) injection 4  mg, 4 mg, Intravenous, Q6H PRN, Tarry Kos, MD, 4 mg at 10/17/12 0754;  ondansetron (ZOFRAN) tablet 4 mg, 4 mg, Oral, Q6H PRN, Tarry Kos, MD promethazine (PHENERGAN) injection 25 mg, 25 mg, Intravenous, Q6H, Calvert Cantor, MD, 25 mg at 10/17/12 1216 Allergies  Allergen Reactions  . Benazepril Hcl     REACTION: cough  . Epinephrine Hcl     REACTION: shakes  . Lidocaine Other (See Comments)    Per MAR     Objective:     BP 173/74  Pulse 96  Temp(Src) 98.7 F (37.1 C) (Oral)  Resp 16  Ht 5\' 4"  (1.626 m)  Wt 55 kg (121 lb 4.1 oz)  BMI 20.8 kg/m2   SpO2 100%  She does not appear in acute distress  Heart regular rhythm no murmurs  Lungs clear  Abdomen: Decreased bowel sounds, soft, generalized diffuse tenderness to deep palpation without rebound tenderness  Laboratory No components found with this basename: d1      Assessment:     Colitis, most likely ischemic, rule out infectious      Plan:     Continue current medical therapy. Obtain GI pathogens panel. Clostridium difficile toxin was negative. Given her age and atherosclerotic appearance of vessels in the abdomen on CT scan I would favor that this clinical picture is that of an ischemic colitis.

## 2012-10-17 NOTE — Progress Notes (Signed)
Prn hydralazine 5mg  given at 0023 was briefly effective and 0100 blood pressure was 151/66.  After this her blood pressure trended up and by 0308 was back up to 185/67.  Lenny Pastel NP was notified and he increased the hydralazine dose. He also advised to give an additional Hydralazine 5mg  at that time and to start the 10mg  at the next appropriate prn interval.  That 2nd dose of 5mg  was given at 0336 and BP at 0400 was 146/62. Will continue to monitor.

## 2012-10-17 NOTE — Progress Notes (Signed)
[facility] Progress Note [place] TEAM 1 - Stepdown/ICU TEAM   Jo Alvarez ZOX:[id number] DOB: [birth year] DOA: 10/14/2012 PCP: Kristian Covey, MD  Brief narrative: 79 year old female patient, resident of skilled nursing facility. Sent to ER because of abdominal pain and progressive alteration in mental status. Developed nausea, vomiting, diarrhea and abdominal pain with confusion and fever on the date of admission. Apparently was in her normal state of health 24 hours prior. Evaluation in the ER revealed significant leukocytosis with a white count greater than 25,000. CT abdomen and pelvis revealed significant distal colitis. Patient also had significant metabolic acidosis and acute renal failure. She was subsequently admitted to the step down unit.  Assessment/Plan: Principal Problem:   Sepsis -Likely secondary to acute colitis; has associated acidosis and elevated Procalcitonin level -Continue supportive care and broad-spectrum empiric antibiotics to cover enteric pathogens -At this point there is no significant improvement in symptoms her white count although pro-calcitonin and lactic acid have improved  Active Problems:  Acute colitis (distal transverse to rectum) -As not much improvement has been noted with antibiotic suspicion is more that this may be ischemic colitis. -Have requested a GI consult today and ordered a GI pathogens panel as recommended by Dr. Evette Cristal -heme positive but no brisk bleeding -rpt. Lactic acid improving -N.p.o. due to continued severe nausea     Lactic acidosis/Dehydration -r/t dirrhea/colitis  -cont. IVF's    LEUKOCYTOSIS -due to hypoperfusion and colitis; +/- UTI  Hypokalemia Replacing IV- Will recheck later today  E. coli UTI -Sensitive to Cipro    HYPERTENSION  -BP rising-start labetalol IV 3 times a day due to inability to take by mouth medications    History of SIADH -not on meds PTA -Sodium was normal    Acute respiratory  failure with hypoxia -CXR normal-wean oxygen   History temporal arteritis -On chronic steroids- if BP drops rec.check random cortisol -HOLD for now since she was on a low dose and has active infection   DVT prophylaxis: SCDs Code Status: DO NOT RESUSCITATE Family Communication: Patient-updated son [name] via phone 2/14 Disposition Plan: Step down  Consultants: None  Procedures: None  Antibiotics: Rocephin 2/13 >>> 2/14 Flagyl 2/13 >>> 2/14 Cipro 2/13 >>> 2/14  HPI/Subjective: Patient quite nauseated today. No significant abd pain.    Objective: Blood pressure 173/74, pulse 96, temperature 98.7 F (37.1 C), temperature source Oral, resp. rate 16, height 5\' 4"  (1.626 m), weight 55 kg (121 lb 4.1 oz), SpO2 100.00%.  Intake/Output Summary (Last 24 hours) at 10/17/12 1343 Last data filed at 10/17/12 1106  Gross per 24 hour  Intake   1200 ml  Output    152 ml  Net   1048 ml     Exam: General: No acute respiratory distress Lungs: Clear to auscultation bilaterally without wheezes or crackles, nasal cannula oxygen Cardiovascular: Regular rate and rhythm without murmur gallop or rub normal S1 and S2, no peripheral edema, no JVD Abdomen: Focally tender with guarding left side especially left mid and lower, nondistended, soft, bowel sounds positive, no rebound, no ascites, no appreciable mass Musculoskeletal: No significant cyanosis, clubbing of bilateral lower extremities Neurological: Arousable and able to verbally communicate but still sleepy, moves all extremities x4, no appreciable focal neurological deficits, cranial nerves II through XII grossly intact  Data Reviewed: Basic Metabolic Panel:  Recent Labs Lab 10/14/12 1743 10/15/12 0445 10/16/12 1002 10/17/12 0520 10/17/12 0830  NA 138 135 141 138  --   K 5.0 4.3 3.5 2.8*  --  CL 100 103 108 105  --   CO2 21 24 18* 19  --   GLUCOSE 180* 121* 90 93  --   BUN 27* 30* 18 12  --   CREATININE 1.02 1.03 0.68 0.54  --    CALCIUM 9.3 7.5* 7.9* 8.0*  --   MG  --   --   --   --  1.7   Liver Function Tests:  Recent Labs Lab 10/14/12 1743 10/15/12 0445  AST 51* 55*  ALT 22 21  ALKPHOS 101 78  BILITOT 0.4 0.2*  PROT 6.4 5.3*  ALBUMIN 3.5 2.7*    Recent Labs Lab 10/14/12 1743  LIPASE 134*   No results found for this basename: AMMONIA,  in the last 168 hours CBC:  Recent Labs Lab 10/14/12 1743 10/15/12 0445 10/16/12 1002 10/17/12 0520  WBC 26.6* 17.5* 17.7* 16.7*  NEUTROABS 24.2*  --   --   --   HGB 12.7 11.2* 10.2* 10.0*  HCT 39.9 34.7* 31.5* 30.1*  MCV 93.2 91.1 91.0 89.9  PLT 375 297 268 287   Cardiac Enzymes: No results found for this basename: CKTOTAL, CKMB, CKMBINDEX, TROPONINI,  in the last 168 hours BNP (last 3 results) No results found for this basename: PROBNP,  in the last 8760 hours CBG:  Recent Labs Lab 10/16/12 0141  GLUCAP 139*    Recent Results (from the past 240 hour(s))  URINE CULTURE     Status: None   Collection Time    10/14/12  5:22 PM      Result Value Range Status   Specimen Description URINE, CATHETERIZED   Final   Special Requests NONE   Final   Culture  Setup Time 10/14/2012 19:41   Final   Colony Count >=100,000 COLONIES/ML   Final   Culture ESCHERICHIA COLI   Final   Report Status 10/16/2012 FINAL   Final   Organism ID, Bacteria ESCHERICHIA COLI   Final  CULTURE, BLOOD (ROUTINE X 2)     Status: None   Collection Time    10/14/12  9:50 PM      Result Value Range Status   Specimen Description BLOOD RIGHT ARM   Final   Special Requests BOTTLES DRAWN AEROBIC AND ANAEROBIC   Final   Culture  Setup Time 10/15/2012 05:22   Final   Culture     Final   Value:        BLOOD CULTURE RECEIVED NO GROWTH TO DATE CULTURE WILL BE HELD FOR 5 DAYS BEFORE ISSUING A FINAL NEGATIVE REPORT   Report Status PENDING   Incomplete  CULTURE, BLOOD (ROUTINE X 2)     Status: None   Collection Time    10/14/12 10:10 PM      Result Value Range Status   Specimen  Description BLOOD RIGHT ARM   Final   Special Requests BOTTLES DRAWN AEROBIC AND ANAEROBIC 10CC   Final   Culture  Setup Time 10/15/2012 05:22   Final   Culture     Final   Value:        BLOOD CULTURE RECEIVED NO GROWTH TO DATE CULTURE WILL BE HELD FOR 5 DAYS BEFORE ISSUING A FINAL NEGATIVE REPORT   Report Status PENDING   Incomplete  MRSA PCR SCREENING     Status: None   Collection Time    10/14/12 11:16 PM      Result Value Range Status   MRSA by PCR NEGATIVE  NEGATIVE Final  Comment:            The GeneXpert MRSA Assay (FDA     approved for NASAL specimens     only), is one component of a     comprehensive MRSA colonization     surveillance program. It is not     intended to diagnose MRSA     infection nor to guide or     monitor treatment for     MRSA infections.  CLOSTRIDIUM DIFFICILE BY PCR     Status: None   Collection Time    10/15/12 12:58 AM      Result Value Range Status   C difficile by pcr NEGATIVE  NEGATIVE Final     Studies:  Recent x-ray studies have been reviewed in detail by the Attending Physician  Scheduled Meds:  Reviewed in detail by the Attending Physician   LOS: 3 days   Faye Strohman,MD [phone] 10/17/2012, 1:43 PM

## 2012-10-18 DIAGNOSIS — E860 Dehydration: Secondary | ICD-10-CM

## 2012-10-18 DIAGNOSIS — E872 Acidosis: Secondary | ICD-10-CM

## 2012-10-18 DIAGNOSIS — N390 Urinary tract infection, site not specified: Secondary | ICD-10-CM

## 2012-10-18 LAB — GI PATHOGEN PANEL BY PCR, STOOL
C difficile toxin A/B: NEGATIVE
Campylobacter by PCR: NEGATIVE
G lamblia by PCR: NEGATIVE
Norovirus GI/GII: NEGATIVE
Rotavirus A by PCR: NEGATIVE

## 2012-10-18 LAB — BASIC METABOLIC PANEL
BUN: 13 mg/dL (ref 6–23)
CO2: 18 mEq/L — ABNORMAL LOW (ref 19–32)
Calcium: 7.6 mg/dL — ABNORMAL LOW (ref 8.4–10.5)
Chloride: 106 mEq/L (ref 96–112)
Creatinine, Ser: 0.55 mg/dL (ref 0.50–1.10)
GFR calc Af Amer: >90 mL/min (ref >90)

## 2012-10-18 LAB — PROCALCITONIN: Procalcitonin: 0.92 ng/mL

## 2012-10-18 MED ORDER — POTASSIUM CHLORIDE CRYS ER 20 MEQ PO TBCR
40.0000 meq | EXTENDED_RELEASE_TABLET | Freq: Once | ORAL | Status: AC
  Administered 2012-10-18: 40 meq via ORAL
  Filled 2012-10-18: qty 2

## 2012-10-18 MED ORDER — PROMETHAZINE HCL 25 MG/ML IJ SOLN
12.5000 mg | Freq: Four times a day (QID) | INTRAMUSCULAR | Status: DC | PRN
  Filled 2012-10-18: qty 1

## 2012-10-18 MED ORDER — POTASSIUM CHLORIDE IN NACL 20-0.9 MEQ/L-% IV SOLN
INTRAVENOUS | Status: DC
  Administered 2012-10-18 – 2012-10-21 (×6): via INTRAVENOUS
  Filled 2012-10-18 (×11): qty 1000

## 2012-10-18 MED ORDER — POTASSIUM CHLORIDE 10 MEQ/100ML IV SOLN
10.0000 meq | INTRAVENOUS | Status: AC
  Administered 2012-10-18 (×5): 10 meq via INTRAVENOUS
  Filled 2012-10-18 (×5): qty 100

## 2012-10-18 MED ORDER — PREDNISONE 10 MG PO TABS
10.0000 mg | ORAL_TABLET | Freq: Every day | ORAL | Status: DC
  Administered 2012-10-18 – 2012-10-22 (×5): 10 mg via ORAL
  Filled 2012-10-18 (×6): qty 1

## 2012-10-18 MED ORDER — MORPHINE SULFATE 2 MG/ML IJ SOLN
1.0000 mg | INTRAMUSCULAR | Status: DC | PRN
  Administered 2012-10-19 – 2012-10-20 (×2): 2 mg via INTRAVENOUS
  Filled 2012-10-18 (×2): qty 1

## 2012-10-18 MED ORDER — ONDANSETRON HCL 4 MG/2ML IJ SOLN
4.0000 mg | INTRAMUSCULAR | Status: DC
  Administered 2012-10-18 – 2012-10-20 (×13): 4 mg via INTRAVENOUS
  Filled 2012-10-18 (×13): qty 2

## 2012-10-18 NOTE — Significant Event (Signed)
CRITICAL VALUE ALERT  Critical value received:  Potassium 2.7 Date of notification:  [id number]   Time of notification:  0648  Critical value read back:yes  Nurse who received alert:  Jeanmarie Plant RN  MD notified (1st page): Lenny Pastel NP Time of first page:  [phone]  Responding MD:  Lenny Pastel NP Time MD responded:  [phone]

## 2012-10-18 NOTE — Clinical Social Work Psychosocial (Signed)
     Clinical Social Work Department BRIEF PSYCHOSOCIAL ASSESSMENT 10/18/2012  Patient:  Jo Alvarez, Jo Alvarez     Account Number:  [id number]     Admit date:  10/14/2012  Clinical Social Worker:  Margaree Mackintosh  Date/Time:  10/18/2012 02:57 PM  Referred by:  Physician  Date Referred:  10/18/2012 Referred for  ALF Placement   Other Referral:   Interview type:  Patient Other interview type:    PSYCHOSOCIAL DATA Living Status:  FACILITY Admitted from facility:  [facility] [address] Level of care:  Assisted Living Primary support name:  [name]:  [phone] Primary support relationship to patient:  CHILD, ADULT Degree of support available:   Unknown.    CURRENT CONCERNS Current Concerns  Post-Acute Placement   Other Concerns:    SOCIAL WORK ASSESSMENT / PLAN Clinical Social Worker reviewed chart and noted pt is from [place], [place].  CSW met with pt at bedside.  CSW introduced self, explained role, and provided support.  Pt confirmed she is from [facility] and is eager to return.  Pt did complain that she has recently experienced difficulty walking.  CSW requested MD consider PT evals. CSW spoke with Lupita Leash at [facility], pt is welcome to return.   Assessment/plan status:  Information/Referral to [facility] Other assessment/ plan:   Information/referral to community resources:   ALF    PATIENTS/FAMILYS RESPONSE TO PLAN OF CARE: Pt was pleasant and engaged in conversation.  Pt thanked CSW for intervention.

## 2012-10-18 NOTE — Progress Notes (Signed)
[facility] Progress Note [place] TEAM 1 - Stepdown/ICU TEAM   Eduarda Scrivens ZOX:[id number] DOB: 15-Mar-1925 DOA: 10/14/2012 PCP: Kristian Covey, MD  Brief narrative: 79 year old female patient, resident of skilled nursing facility. Sent to ER because of abdominal pain and progressive alteration in mental status. Developed nausea, vomiting, diarrhea and abdominal pain with confusion and fever on the date of admission. Apparently was in her normal state of health 24 hours prior. Evaluation in the ER revealed significant leukocytosis with a white count greater than 25,000. CT abdomen and pelvis revealed significant distal colitis. Patient also had significant metabolic acidosis and acute renal failure. She was subsequently admitted to the step down unit.  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 IVF's  Hypokalemia -2/2 diarrhea - IV and oral replete - follow - check Mg  E. coli UTI -Sensitive to Cipro  HYPERTENSION  -BP rising-continue labetalol - slowly resume home meds as able  History of SIADH -not on meds PTA -Sodium was normal - recheck in AM  Acute respiratory failure with hypoxia -CXR normal-wean oxygen  History temporal arteritis -On chronic steroids- if BP drops rec.check random  cortisol -resume home dose oral prednisone  DVT prophylaxis: SCDs Code Status: DO NOT RESUSCITATE Family Communication: Patient-updated son [name]  Disposition Plan: Step down  Consultants: Gastroenterology  Procedures: None  Antibiotics: Rocephin 2/13 >>> 2/14 Flagyl 2/13 >>>  Cipro 2/13 >>>   HPI/Subjective: Patient still nauseated. Endorses much improved abdominal pain.  Continues to have diarrhea.  Objective: Blood pressure 163/63, pulse 86, temperature 99.8 F (37.7 C), temperature source Oral, resp. rate 30, height 5\' 4"  (1.626 m), weight 54 kg (119 lb 0.8 oz), SpO2 100.00%.  Intake/Output Summary (Last 24 hours) at 10/18/12 1231 Last data filed at 10/18/12 0805  Gross per 24 hour  Intake   2850 ml  Output      0 ml  Net   2850 ml   Exam: General: No acute respiratory distress Lungs: Clear to auscultation bilaterally without wheezes or crackles, nasal cannula oxygen Cardiovascular: Regular rate and rhythm without murmur gallop or rub normal S1 and S2, no peripheral edema, no JVD, IVF at 100/hr Abdomen: Minimally tender left side, nondistended, soft, bowel sounds positive, no rebound, no ascites, no appreciable mass; still with high volume liquid stool/non-bloody Musculoskeletal: No significant cyanosis, clubbing of bilateral lower extremities Neurological: moves all extremities x4, no appreciable focal neurological deficits, cranial nerves II through XII grossly intact  Data Reviewed: Basic Metabolic Panel:  Recent Labs Lab 10/14/12 1743 10/15/12 0445 10/16/12 1002 10/17/12 0520 10/17/12 0830 10/17/12 1529 10/18/12 0455  NA 138 135 141 138  --   --  139  K 5.0 4.3 3.5 2.8*  --  2.8* 2.7*  CL 100 103 108 105  --   --  106  CO2 21 24 18* 19  --   --  18*  GLUCOSE 180* 121* 90 93  --   --  78  BUN 27* 30* 18 12  --   --  13  CREATININE 1.02 1.03 0.68 0.54  --   --  0.55  CALCIUM 9.3 7.5* 7.9* 8.0*  --   --  7.6*  MG  --   --   --   --  1.7  --   --     Liver Function Tests:  Recent Labs Lab 10/14/12 1743 10/15/12 0445  AST 51* 55*  ALT 22 21  ALKPHOS 101 78  BILITOT 0.4 0.2*  PROT 6.4 5.3*  ALBUMIN 3.5 2.7*    Recent Labs Lab 10/14/12 1743  LIPASE 134*   CBC:  Recent Labs Lab 10/14/12 1743 10/15/12 0445 10/16/12 1002 10/17/12 0520  WBC 26.6* 17.5* 17.7* 16.7*  NEUTROABS 24.2*  --   --   --   HGB 12.7 11.2* 10.2* 10.0*  HCT 39.9 34.7* 31.5* 30.1*  MCV 93.2 91.1 91.0 89.9  PLT 375 297 268 287   CBG:  Recent Labs Lab 10/16/12 0141  GLUCAP 139*    Recent Results (from the past 240 hour(s))  URINE CULTURE     Status: None   Collection Time    10/14/12  5:22 PM      Result Value Range Status   Specimen Description URINE, CATHETERIZED   Final   Special Requests NONE   Final   Culture  Setup Time 10/14/2012 19:41   Final   Colony Count >=100,000 COLONIES/ML   Final   Culture ESCHERICHIA COLI   Final   Report Status 10/16/2012 FINAL   Final   Organism ID, Bacteria ESCHERICHIA COLI   Final  CULTURE, BLOOD (ROUTINE X 2)     Status: None   Collection Time    10/14/12  9:50 PM      Result Value Range Status   Specimen Description BLOOD RIGHT ARM   Final   Special Requests BOTTLES DRAWN AEROBIC AND ANAEROBIC   Final   Culture  Setup Time 10/15/2012 05:22   Final   Culture     Final   Value:        BLOOD CULTURE RECEIVED NO GROWTH TO DATE CULTURE WILL BE HELD FOR 5 DAYS BEFORE ISSUING A FINAL NEGATIVE REPORT   Report Status PENDING   Incomplete  CULTURE, BLOOD (ROUTINE X 2)     Status: None   Collection Time    10/14/12 10:10 PM      Result Value Range Status   Specimen Description BLOOD RIGHT ARM   Final   Special Requests BOTTLES DRAWN AEROBIC AND ANAEROBIC 10CC   Final   Culture  Setup Time 10/15/2012 05:22   Final   Culture     Final   Value:        BLOOD CULTURE RECEIVED NO GROWTH TO DATE CULTURE WILL BE HELD FOR 5 DAYS BEFORE ISSUING A FINAL NEGATIVE REPORT   Report Status PENDING    Incomplete  MRSA PCR SCREENING     Status: None   Collection Time    10/14/12 11:16 PM      Result Value Range Status   MRSA by PCR NEGATIVE  NEGATIVE Final   Comment:            The GeneXpert MRSA Assay (FDA     approved for NASAL specimens     only), is one component of a     comprehensive MRSA colonization     surveillance program. It is not  intended to diagnose MRSA     infection nor to guide or     monitor treatment for     MRSA infections.  CLOSTRIDIUM DIFFICILE BY PCR     Status: None   Collection Time    10/15/12 12:58 AM      Result Value Range Status   C difficile by pcr NEGATIVE  NEGATIVE Final     Studies:  Recent x-ray studies have been reviewed in detail by the Attending Physician  Scheduled Meds:  Reviewed in detail by the Attending Physician   LOS: 4 days   ELLIS,ALLISON L.,ANP  Pager: [phone] 10/18/2012, 12:31 PM  I have personally examined this patient and reviewed the entire database. I have reviewed the above note, made any necessary editorial changes, and agree with its content.  Lonia Blood, MD [facility]

## 2012-10-18 NOTE — Progress Notes (Signed)
[facility] Progress Note  Subjective: The patient states that she feels better today.  Objective: Vital signs in last 24 hours: Temp:  [97.6 F (36.4 C)-99.8 F (37.7 C)] 99.8 F (37.7 C) (02/17 1132) Pulse Rate:  [86-121] 86 (02/17 1132) Resp:  [19-34] 30 (02/17 1132) BP: (133-181)/(54-93) 163/63 mmHg (02/17 1132) SpO2:  [89 %-100 %] 100 % (02/17 1132) Weight:  [54 kg (119 lb 0.8 oz)] 54 kg (119 lb 0.8 oz) (02/17 0500) Weight change: -1 kg (-2 lb 3.3 oz)   PE  Abdomen is soft, diffuse discomfort, but less so than yesterday Lab Results: Results for orders placed during the hospital encounter of 10/14/12 (from the past 24 hour(s))  POTASSIUM     Status: Abnormal   Collection Time    10/17/12  3:29 PM      Result Value Range   Potassium 2.8 (*) 3.5 - 5.1 mEq/L  PROCALCITONIN     Status: None   Collection Time    10/18/12  4:55 AM      Result Value Range   Procalcitonin 0.92    BASIC METABOLIC PANEL     Status: Abnormal   Collection Time    10/18/12  4:55 AM      Result Value Range   Sodium 139  135 - 145 mEq/L   Potassium 2.7 (*) 3.5 - 5.1 mEq/L   Chloride 106  96 - 112 mEq/L   CO2 18 (*) 19 - 32 mEq/L   Glucose, Bld 78  70 - 99 mg/dL   BUN 13  6 - 23 mg/dL   Creatinine, Ser 1.61  0.50 - 1.10 mg/dL   Calcium 7.6 (*) 8.4 - 10.5 mg/dL   GFR calc non Af Amer 83 (*) >90 mL/min   GFR calc Af Amer >90  >90 mL/min    Studies/Results: @RISRSLT24 @    Assessment: Colitis, which I suspect is probably ischemic in nature. GI pathogens panel is pending.  Plan: Continue medical management and empiric antibiotic therapy. Follow clinically.    Daurice Ovando F 10/18/2012, 1:04 PM

## 2012-10-19 ENCOUNTER — Other Ambulatory Visit: Payer: Self-pay | Admitting: *Deleted

## 2012-10-19 DIAGNOSIS — D72829 Elevated white blood cell count, unspecified: Secondary | ICD-10-CM

## 2012-10-19 LAB — BASIC METABOLIC PANEL
BUN: 14 mg/dL (ref 6–23)
Calcium: 7.6 mg/dL — ABNORMAL LOW (ref 8.4–10.5)
GFR calc Af Amer: >90 mL/min (ref >90)
GFR calc non Af Amer: 82 mL/min — ABNORMAL LOW (ref >90)
Potassium: 3.3 mEq/L — ABNORMAL LOW (ref 3.5–5.1)

## 2012-10-19 LAB — CBC
HCT: 28.4 % — ABNORMAL LOW (ref 36.0–46.0)
MCH: 29.7 pg (ref 26.0–34.0)
MCHC: 33.1 g/dL (ref 30.0–36.0)
RDW: 14.2 % (ref 11.5–15.5)

## 2012-10-19 MED ORDER — DIAZEPAM 5 MG PO TABS: 5.0000 mg | ORAL_TABLET | Freq: Two times a day (BID) | ORAL | Status: DC | PRN

## 2012-10-19 MED ORDER — OXYCODONE-ACETAMINOPHEN 5-325 MG PO TABS
1.0000 | ORAL_TABLET | Freq: Four times a day (QID) | ORAL | Status: DC | PRN
  Administered 2012-10-19 – 2012-10-22 (×7): 1 via ORAL
  Filled 2012-10-19 (×7): qty 1

## 2012-10-19 MED ORDER — POTASSIUM CHLORIDE CRYS ER 20 MEQ PO TBCR
40.0000 meq | EXTENDED_RELEASE_TABLET | Freq: Once | ORAL | Status: AC
  Administered 2012-10-19: 40 meq via ORAL
  Filled 2012-10-19: qty 2

## 2012-10-19 MED ORDER — MAGNESIUM SULFATE 40 MG/ML IJ SOLN
2.0000 g | Freq: Once | INTRAMUSCULAR | Status: AC
  Administered 2012-10-19: 2 g via INTRAVENOUS
  Filled 2012-10-19: qty 50

## 2012-10-19 MED ORDER — GABAPENTIN 300 MG PO CAPS
300.0000 mg | ORAL_CAPSULE | Freq: Two times a day (BID) | ORAL | Status: DC
  Administered 2012-10-19 – 2012-10-22 (×7): 300 mg via ORAL
  Filled 2012-10-19 (×8): qty 1

## 2012-10-19 MED ORDER — OXYCODONE-ACETAMINOPHEN 10-325 MG PO TABS: 1.0000 | ORAL_TABLET | Freq: Four times a day (QID) | ORAL | Status: DC

## 2012-10-19 MED ORDER — OXYCODONE HCL 5 MG PO TABS
5.0000 mg | ORAL_TABLET | Freq: Four times a day (QID) | ORAL | Status: DC | PRN
  Administered 2012-10-20 – 2012-10-22 (×6): 5 mg via ORAL
  Filled 2012-10-19 (×6): qty 1

## 2012-10-19 MED ORDER — SERTRALINE HCL 50 MG PO TABS
75.0000 mg | ORAL_TABLET | Freq: Every day | ORAL | Status: DC
  Administered 2012-10-19 – 2012-10-22 (×4): 75 mg via ORAL
  Filled 2012-10-19 (×4): qty 1

## 2012-10-19 MED ORDER — OXYCODONE-ACETAMINOPHEN 10-325 MG PO TABS: 1.0000 | ORAL_TABLET | Freq: Four times a day (QID) | ORAL | Status: DC | PRN

## 2012-10-19 NOTE — Progress Notes (Signed)
[facility] Progress Note  Subjective: Resting comfortably  Objective: Vital signs in last 24 hours: Temp:  [97.8 F (36.6 C)-99.8 F (37.7 C)] 98 F (36.7 C) (02/18 0823) Pulse Rate:  [28-95] 95 (02/18 0823) Resp:  [16-30] 20 (02/18 0823) BP: (138-184)/(57-114) 142/64 mmHg (02/18 0823) SpO2:  [88 %-100 %] 97 % (02/18 0859) Weight:  [57.1 kg (125 lb 14.1 oz)] 57.1 kg (125 lb 14.1 oz) (02/18 0425) Weight change: 3.1 kg (6 lb 13.4 oz)   PE: Less abdominal pain today on palpation of abdomen  Lab Results: Results for orders placed during the hospital encounter of 10/14/12 (from the past 24 hour(s))  CBC     Status: Abnormal   Collection Time    10/19/12  5:20 AM      Result Value Range   WBC 11.9 (*) 4.0 - 10.5 K/uL   RBC 3.16 (*) 3.87 - 5.11 MIL/uL   Hemoglobin 9.4 (*) 12.0 - 15.0 g/dL   HCT 40.9 (*) 81.1 - 91.4 %   MCV 89.9  78.0 - 100.0 fL   MCH 29.7  26.0 - 34.0 pg   MCHC 33.1  30.0 - 36.0 g/dL   RDW 78.2  95.6 - 21.3 %   Platelets 291  150 - 400 K/uL  BASIC METABOLIC PANEL     Status: Abnormal   Collection Time    10/19/12  5:20 AM      Result Value Range   Sodium 138  135 - 145 mEq/L   Potassium 3.3 (*) 3.5 - 5.1 mEq/L   Chloride 107  96 - 112 mEq/L   CO2 17 (*) 19 - 32 mEq/L   Glucose, Bld 78  70 - 99 mg/dL   BUN 14  6 - 23 mg/dL   Creatinine, Ser 0.86  0.50 - 1.10 mg/dL   Calcium 7.6 (*) 8.4 - 10.5 mg/dL   GFR calc non Af Amer 82 (*) >90 mL/min   GFR calc Af Amer >90  >90 mL/min  MAGNESIUM     Status: None   Collection Time    10/19/12  5:20 AM      Result Value Range   Magnesium 1.6  1.5 - 2.5 mg/dL    Studies/Results: @RISRSLT24 @    Assessment: Ischemic colitis. GI pathogens panel was negative  Plan: Continue medical management and supportive care    Aliena Ghrist F 10/19/2012, 11:18 AM

## 2012-10-19 NOTE — Evaluation (Signed)
Physical Therapy Evaluation Patient Details Name: Jo Alvarez MRN: [id number] DOB: 02-Mar-1925 Today's Date: 10/19/2012 Time: 4098-1191 PT Time Calculation (min): 24 min  PT Assessment / Plan / Recommendation Clinical Impression  Pt admitted from [facility] ALF with  sepsis, colitis, AMS and decreased mobility. Pt reports she has assist for ADLs and housework but that she can transfer to [facility] on her own, no family present to clarify accuracy of this account. Pt currently total assist to transfer to chair and may need SNF at discharge if ALF unable to provide extensive assist with transfers at discharge. Will follow to maximize function and safety with mobility prior to discharge.     PT Assessment  Patient needs continued PT services    Follow Up Recommendations  SNF;Other (comment) (vs ALF pending progress and facility ability to assist)    Does the patient have the potential to tolerate intense rehabilitation      Barriers to Discharge Decreased caregiver support      Equipment Recommendations  None recommended by PT    Recommendations for Other Services     Frequency Min 3X/week    Precautions / Restrictions Precautions Precautions: Fall   Pertinent Vitals/Pain sats 97% on RA No pain      Mobility  Bed Mobility Bed Mobility: Rolling Left;Left Sidelying to Sit Rolling Left: 4: Min assist;With rail Left Sidelying to Sit: 3: Mod assist;HOB flat Details for Bed Mobility Assistance: cueing for sequence with assist to rotate trunk with cueing needed to initiate movement and assist for trunk elevation from surface. Attempted to stand from bed but pt with tendency to kick legs out into extension in midair with attempts to stand rather than pushing feet into floor with knee extension making standing very difficullt even with feet and knees blocked Transfers Transfers: Sit to Stand;Stand to Sit;Squat Pivot Transfers Sit to Stand: 1: +1 Total assist;From bed Stand to Sit: 1:  +1 Total assist;To chair/3-in-1;To bed Squat Pivot Transfers: 1: +1 Total assist Details for Transfer Assistance: pt with total assist to bring trunk anteriorly and shift weight onto feet with knees blocked with assist at trunk and pelvis to pivot to chair Ambulation/Gait Ambulation/Gait Assistance: Not tested (comment)    Exercises     PT Diagnosis: Generalized weakness;Altered mental status  PT Problem List: Decreased strength;Decreased activity tolerance;Decreased mobility;Decreased cognition PT Treatment Interventions: DME instruction;Functional mobility training;Therapeutic activities;Therapeutic exercise;Patient/family education   PT Goals Acute Rehab PT Goals PT Goal Formulation: With patient Pt will go Supine/Side to Sit: with min assist;with HOB 0 degrees PT Goal: Supine/Side to Sit - Progress: Goal set today Pt will go Sit to Supine/Side: with min assist;with HOB 0 degrees PT Goal: Sit to Supine/Side - Progress: Goal set today Pt will go Sit to Stand: with mod assist PT Goal: Sit to Stand - Progress: Goal set today Pt will go Stand to Sit: with mod assist PT Goal: Stand to Sit - Progress: Goal set today Pt will Transfer Bed to Chair/Chair to Bed: with mod assist PT Transfer Goal: Bed to Chair/Chair to Bed - Progress: Goal set today  Visit Information  Last PT Received On: 10/19/12 Assistance Needed: +2    Subjective Data  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) Patient Stated Goal: return to [employer]   Prior Functioning  Home Living Lives With: Alone Type of Home: Assisted living Home Access: Level entry Home Layout: One level Bathroom Shower/Tub: Health visitor: Standard Home Adaptive Equipment: Wheelchair - manual;Walker - rolling;Shower  chair with back Prior Function Level of Independence: Needs assistance Needs Assistance: Bathing;Dressing;Meal Prep;Light Housekeeping Bath: Moderate Dressing: Moderate Meal Prep:  Total Light Housekeeping: Total Able to Take Stairs?: No Driving: No Vocation: Retired Musician: Surveyor, mining Overall Cognitive Status: History of cognitive impairments - at baseline Area of Impairment: Memory Arousal/Alertness: Awake/alert Orientation Level: Disoriented to;Time Behavior During Session: WFL for tasks performed Memory Deficits: difficulty recalling PLOF and assist at baseline    Extremity/Trunk Assessment Right Upper Extremity Assessment RUE ROM/Strength/Tone: Deficits RUE ROM/Strength/Tone Deficits: decreased finger extension on right, grossly 2/5 did not formally assess Left Upper Extremity Assessment LUE ROM/Strength/Tone: Deficits LUE ROM/Strength/Tone Deficits: grossly 2/5 Right Lower Extremity Assessment RLE ROM/Strength/Tone: Deficits;Unable to fully assess;Due to impaired cognition RLE ROM/Strength/Tone Deficits: 2/5 grossly, pt able to bend hips and knees in supine but difficulty maintaining feet on floor and pushing through legs with transfer. Left Lower Extremity Assessment LLE ROM/Strength/Tone: Deficits;Unable to fully assess;Due to impaired cognition LLE ROM/Strength/Tone Deficits: grossly 2/5 Trunk Assessment Trunk Assessment: Kyphotic   Balance Static Sitting Balance Static Sitting - Balance Support: Bilateral upper extremity supported;Feet supported Static Sitting - Level of Assistance: 5: Stand by assistance Static Sitting - Comment/# of Minutes: 3  End of Session PT - End of Session Activity Tolerance: Patient limited by fatigue Patient left: in chair;with call bell/phone within reach Nurse Communication: Mobility status  GP     Delorse Lek 10/19/2012, 9:10 AM Delaney Meigs, PT [phone]

## 2012-10-19 NOTE — Progress Notes (Signed)
[facility] Progress Note [place] TEAM 1 - Stepdown/ICU TEAM   Jo Alvarez ZOX:[id number] DOB: August 08, 1925 DOA: 10/14/2012 PCP: Kristian Covey, MD  Brief narrative: 79 year old female patient, resident of skilled nursing facility. Sent to ER because of abdominal pain and progressive alteration in mental status. Developed nausea, vomiting, diarrhea and abdominal pain with confusion and fever on the date of admission. Apparently was in her normal state of health 24 hours prior. Evaluation in the ER revealed significant leukocytosis with a white count greater than 25,000. CT abdomen and pelvis revealed significant distal colitis. Patient also had significant metabolic acidosis and acute renal failure. She was subsequently admitted to the step down unit.  Assessment/Plan:  Sepsis -Likely secondary to acute colitis; had associated acidosis and elevated Procalcitonin level -Continue supportive care and broad-spectrum empiric antibiotics to cover enteric pathogens  Acute colitis (distal transverse to rectum) -likely ischemic in nature (findngs in distribution of inferior mesenteric artery which is diseased as per CT scan) -heme positive but no brisk bleeding -lactic acid improved/PCT down to 0.92 (peak 7.47) -cont empiric anbx coverage for enteric pathogens - PCR panel negative -trial clears -?persistent nausea 2/2 Dilaudid- continue IV Mso4 and scheduled Zofran   Lactic acidosis/Dehydration/persistent diarrhea -r/t dirrhea/colitis - flexiseal with green liquid stool -cont. IVF's and clears  Hypokalemia/hypomagnesemia -2/2 diarrhea - IV and oral replete - follow - Mg 1.6: replete  E. coli UTI -Sensitive to Cipro  HYPERTENSION  -BP rising-continue labetalol - slowly resume home meds as able  History of SIADH -not on meds PTA -Sodium was normal - recheck in AM  Acute respiratory failure with hypoxia -CXR normal-wean oxygen  History temporal arteritis/psoriatic  arthritis -On chronic steroids- if BP drops rec.check random cortisol -Cont. home dose oral prednisone -Was on Avada pre-admit- can likely resume since PCR negative for enteric pathogens  DVT prophylaxis: SCDs Code Status: DO NOT RESUSCITATE Family Communication: Patient-updated son [name] 2/17 Disposition Plan: Transfer to med surg  Consultants: Gastroenterology  Procedures: None  Antibiotics: Rocephin 2/13 >>> 2/14 Flagyl 2/13 >>>  Cipro 2/13 >>>   HPI/Subjective: Patient tolerated clears w/o abdominal pain or nausea  Objective: Blood pressure 142/64, pulse 95, temperature 98 F (36.7 C), temperature source Oral, resp. rate 20, height 5\' 4"  (1.626 m), weight 57.1 kg (125 lb 14.1 oz), SpO2 97.00%.  Intake/Output Summary (Last 24 hours) at 10/19/12 1136 Last data filed at 10/19/12 8119  Gross per 24 hour  Intake   1575 ml  Output    450 ml  Net   1125 ml   Exam: General: No acute respiratory distress Lungs: Clear to auscultation bilaterally without wheezes or crackles, nasal cannula oxygen Cardiovascular: Regular rate and rhythm without murmur gallop or rub normal S1 and S2, no peripheral edema, no JVD, IVF at 100/hr Abdomen: Minimally tender left side, nondistended, soft, bowel sounds positive, no rebound, no ascites, no appreciable mass; still with high volume liquid stool/non-bloody per Flexi-seal Musculoskeletal: No significant cyanosis, clubbing of bilateral lower extremities Neurological: moves all extremities x4, no appreciable focal neurological deficits, cranial nerves II through XII grossly intact  Data Reviewed: Basic Metabolic Panel:  Recent Labs Lab 10/15/12 0445 10/16/12 1002 10/17/12 0520 10/17/12 0830 10/17/12 1529 10/18/12 0455 10/19/12 0520  NA 135 141 138  --   --  139 138  K 4.3 3.5 2.8*  --  2.8* 2.7* 3.3*  CL 103 108 105  --   --  106 107  CO2 24 18* 19  --   --  18* 17*  GLUCOSE 121* 90 93  --   --  78 78  BUN 30* 18 12  --   --  13 14   CREATININE 1.03 0.68 0.54  --   --  0.55 0.57  CALCIUM 7.5* 7.9* 8.0*  --   --  7.6* 7.6*  MG  --   --   --  1.7  --   --  1.6   Liver Function Tests:  Recent Labs Lab 10/14/12 1743 10/15/12 0445  AST 51* 55*  ALT 22 21  ALKPHOS 101 78  BILITOT 0.4 0.2*  PROT 6.4 5.3*  ALBUMIN 3.5 2.7*    Recent Labs Lab 10/14/12 1743  LIPASE 134*   CBC:  Recent Labs Lab 10/14/12 1743 10/15/12 0445 10/16/12 1002 10/17/12 0520 10/19/12 0520  WBC 26.6* 17.5* 17.7* 16.7* 11.9*  NEUTROABS 24.2*  --   --   --   --   HGB 12.7 11.2* 10.2* 10.0* 9.4*  HCT 39.9 34.7* 31.5* 30.1* 28.4*  MCV 93.2 91.1 91.0 89.9 89.9  PLT 375 297 268 287 291   CBG:  Recent Labs Lab 10/16/12 0141  GLUCAP 139*    Recent Results (from the past 240 hour(s))  URINE CULTURE     Status: None   Collection Time    10/14/12  5:22 PM      Result Value Range Status   Specimen Description URINE, CATHETERIZED   Final   Special Requests NONE   Final   Culture  Setup Time 10/14/2012 19:41   Final   Colony Count >=100,000 COLONIES/ML   Final   Culture ESCHERICHIA COLI   Final   Report Status 10/16/2012 FINAL   Final   Organism ID, Bacteria ESCHERICHIA COLI   Final  CULTURE, BLOOD (ROUTINE X 2)     Status: None   Collection Time    10/14/12  9:50 PM      Result Value Range Status   Specimen Description BLOOD RIGHT ARM   Final   Special Requests BOTTLES DRAWN AEROBIC AND ANAEROBIC   Final   Culture  Setup Time 10/15/2012 05:22   Final   Culture     Final   Value:        BLOOD CULTURE RECEIVED NO GROWTH TO DATE CULTURE WILL BE HELD FOR 5 DAYS BEFORE ISSUING A FINAL NEGATIVE REPORT   Report Status PENDING   Incomplete  CULTURE, BLOOD (ROUTINE X 2)     Status: None   Collection Time    10/14/12 10:10 PM      Result Value Range Status   Specimen Description BLOOD RIGHT ARM   Final   Special Requests BOTTLES DRAWN AEROBIC AND ANAEROBIC 10CC   Final   Culture  Setup Time 10/15/2012 05:22   Final    Culture     Final   Value:        BLOOD CULTURE RECEIVED NO GROWTH TO DATE CULTURE WILL BE HELD FOR 5 DAYS BEFORE ISSUING A FINAL NEGATIVE REPORT   Report Status PENDING   Incomplete  MRSA PCR SCREENING     Status: None   Collection Time    10/14/12 11:16 PM      Result Value Range Status   MRSA by PCR NEGATIVE  NEGATIVE Final   Comment:            The GeneXpert MRSA Assay (FDA     approved for NASAL specimens     only), is one  component of a     comprehensive MRSA colonization     surveillance program. It is not     intended to diagnose MRSA     infection nor to guide or     monitor treatment for     MRSA infections.  CLOSTRIDIUM DIFFICILE BY PCR     Status: None   Collection Time    10/15/12 12:58 AM      Result Value Range Status   C difficile by pcr NEGATIVE  NEGATIVE Final  STOOL CULTURE     Status: None   Collection Time    10/17/12  3:51 PM      Result Value Range Status   Specimen Description STOOL   Final   Special Requests ADDED 0334 10/18/12   Final   Culture Culture reincubated for better growth   Final   Report Status PENDING   Incomplete     Studies:  Recent x-ray studies have been reviewed in detail by the Attending Physician  Scheduled Meds:  Reviewed in detail by the Attending Physician   LOS: 5 days   ELLIS,ALLISON L.,ANP  Pager: [phone] 10/19/2012, 11:36 AM  I have examined the patient, reviewed the chart and modified the above note which I agree with.   [place]asa[place]ra Takacs,MD [phone] 10/19/2012, 1:57 PM

## 2012-10-19 NOTE — Progress Notes (Signed)
Occupational Therapy Evaluation Patient Details Name: Kenisha Lynds MRN: [id number] DOB: June 28, 1925 Today's Date: 10/19/2012 Time: 1420-1500 OT Time Calculation (min): 40 min  OT Assessment / Plan / Recommendation Clinical Impression  79 yo resident of [facility] ALF. Pt admitted with sepsis, collitis and AMS. Pt with significant functional decline. Discussed PLOF with son. Pt will need rehab at SNF with goal to return to ALF if able. Family in agreement and would like [facility] if bed available.. OT will follow to facilitate D/C to SNF.    OT Assessment  Patient needs continued OT Services    Follow Up Recommendations  SNF    Barriers to Discharge None SNF  Equipment Recommendations  None recommended by OT    Recommendations for Other Services  sw consult for placement  Frequency  Min 2X/week    Precautions / Restrictions Precautions Precautions: Fall Precaution Comments: watch skin   Pertinent Vitals/Pain No c/o pain.  HR 84. O2 sats 100 RA. RR 23. BP 129/57    ADL  Eating/Feeding: Minimal assistance Where Assessed - Eating/Feeding: Bed level (needs theratubing) Grooming: Moderate assistance Where Assessed - Grooming: Supported sitting Upper Body Bathing: Moderate assistance Where Assessed - Upper Body Bathing: Unsupported sitting Lower Body Bathing: +1 Total assistance Where Assessed - Lower Body Bathing: Supine, head of bed up;Rolling right and/or left Upper Body Dressing: Maximal assistance Where Assessed - Upper Body Dressing: Unsupported sitting Lower Body Dressing: +1 Total assistance Where Assessed - Lower Body Dressing: Rolling right and/or left;Supine, head of bed up Toilet Transfer: +2 Total assistance Transfers/Ambulation Related to ADLs: total A +2` ADL Comments: significant decline in functional status    OT Diagnosis: Generalized weakness;Acute pain;Altered mental status;Paresis  OT Problem List: Decreased strength;Decreased range of  motion;Decreased activity tolerance;Impaired balance (sitting and/or standing);Decreased coordination;Decreased cognition;Decreased safety awareness;Decreased knowledge of use of DME or AE;Decreased knowledge of precautions;Cardiopulmonary status limiting activity;Impaired sensation;Impaired tone;Impaired UE functional use;Pain OT Treatment Interventions: Self-care/ADL training;Therapeutic exercise;Neuromuscular education;DME and/or AE instruction;Therapeutic activities;Patient/family education;Balance training   OT Goals Acute Rehab OT Goals OT Goal Formulation: With patient/family Time For Goal Achievement: 11/02/12 Potential to Achieve Goals: Good ADL Goals Pt Will Perform Eating: with modified independence;with adaptive utensils;Supported;Sitting, chair ADL Goal: Eating - Progress: Goal set today Pt Will Perform Grooming: with set-up;Supported;with adaptive equipment ADL Goal: Grooming - Progress: Goal set today Pt Will Perform Upper Body Bathing: with set-up;with supervision;Supported;with cueing (comment type and amount) ADL Goal: Upper Body Bathing - Progress: Goal set today Additional ADL Goal #1: Pt will sit EOB x 10 min unsupported in prep for ADL retraining ADL Goal: Additional Goal #1 - Progress: Goal set today  Visit Information  Last OT Received On: 10/19/12 Assistance Needed: +2    Subjective Data   I have fallen too many times   Prior Functioning     Home Living Lives With: Alone Available Help at Discharge: Available 24 hours/day (ALF) Prior Function Level of Independence: Needs assistance Needs Assistance: Bathing;Dressing;Grooming;Toileting;Meal Prep;Light Housekeeping;Transfers Bath: Minimal Dressing: Minimal Meal Prep: Total Light Housekeeping: Total Communication Communication: HOH Dominant Hand: Left         Vision/Perception     Cognition  Cognition Overall Cognitive Status: Impaired Area of Impairment:  Attention;Memory;Safety/judgement;Awareness of errors;Awareness of deficits;Problem solving;Executive functioning Arousal/Alertness: Lethargic Orientation Level: Disoriented to;Place;Time;Situation Behavior During Session: Lethargic Current Attention Level: Sustained Memory: Decreased recall of precautions Memory Deficits: unable to name facility where she lives Safety/Judgement: Decreased awareness of safety precautions;Decreased safety judgement for tasks assessed Problem Solving: mod a  functional basic Executive Functioning: decrased reasoning/judgement    Extremity/Trunk Assessment Right Upper Extremity Assessment RUE ROM/Strength/Tone: Deficits RUE ROM/Strength/Tone Deficits: claw hand/intrinsic wasting RUE Sensation: Deficits;History of peripheral neuropathy RUE Sensation Deficits: decreased  RUE Coordination: Deficits RUE Coordination Deficits: functions @ C6-7 level Left Upper Extremity Assessment LUE ROM/Strength/Tone:  (same as right. decreased shoulder flex rom. @ 30 FF) LUE Sensation: History of peripheral neuropathy Right Lower Extremity Assessment RLE ROM/Strength/Tone: Deficits RLE Sensation: History of peripheral neuropathy Left Lower Extremity Assessment LLE ROM/Strength/Tone: Deficits LLE Sensation: History of peripheral neuropathy Trunk Assessment Trunk Assessment: Kyphotic     Mobility Bed Mobility Bed Mobility: Supine to Sit;Sitting - Scoot to Edge of Bed Rolling Left: With rail;2: Max assist Sitting - Scoot to Edge of Bed: 1: +1 Total assist Transfers Transfers: Not assessed (pt refused)     Exercise     Balance Static Sitting Balance Static Sitting - Balance Support: Bilateral upper extremity supported;Feet supported Static Sitting - Level of Assistance: 5: Stand by assistance (leaning L)   End of Session OT - End of Session Activity Tolerance: Patient limited by fatigue;Other (comment) (limited by cognition) Patient left: in bed;with call  bell/phone within reach Nurse Communication: Mobility status;Other (comment) (need for tubing for utensils)  GO     Malicia Blasdel,HILLARY 10/19/2012, 3:33 PM [facility], OTR/L  [phone] 10/19/2012

## 2012-10-19 NOTE — Progress Notes (Signed)
Report given to Misty Stanley, [place]; pt to be transferred to 5506. Fm and pt aware. Pts vital signs are stable at this time. Pt has no complaints and is not in any apparent distress at this time. Pt will will be transported in her bed.

## 2012-10-19 NOTE — Progress Notes (Signed)
NUTRITION FOLLOW UP  Intervention:   1.  Modify diet; per MD discretion based on tolerance and care plan.  No supplements ordered at this time due to ongoing workup and pt's limited acceptance.  Will follow.  Nutrition Dx:   Inadequate oral intake, ongoing.  Now r/t omission of energy dense foods AEB significant colitis.  Monitor:   1. Food/beverage; diet advancement as able.  2. GOC; for appropriate nutrition-related interventions  Assessment:   Pt admitted with AMS and abdominal pain.  Pt with limited mobility- ability to transfer to wheelchair.  Pt is from SNF. Ongoing work-up for colitis. Per MD note, suspected ischemic colitis.  Pt has started clear liquid diet.  Some jello consumed at breakfast- minimal intake at this time. Diarrhea ongoing.  RD following wt trends.  Note some non-pitting edema.   Height: Ht Readings from Last 1 Encounters:  10/14/12 5\' 4"  (1.626 m)    Weight Status:   Wt Readings from Last 1 Encounters:  10/19/12 125 lb 14.1 oz (57.1 kg)    Re-estimated needs:  Kcal: 1270-1360 Protein: 54-65g Fluid: >1.6 L/day  Skin: Stage 1 and 2 wounds to sacrum, non-pitting edema  Diet Order: Clear Liquid   Intake/Output Summary (Last 24 hours) at 10/19/12 0956 Last data filed at 10/19/12 0824  Gross per 24 hour  Intake   1975 ml  Output    450 ml  Net   1525 ml    Last BM: 2/18   Labs:   Recent Labs Lab 10/17/12 0520 10/17/12 0830 10/17/12 1529 10/18/12 0455 10/19/12 0520  NA 138  --   --  139 138  K 2.8*  --  2.8* 2.7* 3.3*  CL 105  --   --  106 107  CO2 19  --   --  18* 17*  BUN 12  --   --  13 14  CREATININE 0.54  --   --  0.55 0.57  CALCIUM 8.0*  --   --  7.6* 7.6*  MG  --  1.7  --   --  1.6  GLUCOSE 93  --   --  78 78    CBG (last 3)  No results found for this basename: GLUCAP,  in the last 72 hours  Scheduled Meds: . antiseptic oral rinse  15 mL Mouth Rinse BID  . ciprofloxacin  400 mg Intravenous Q12H  . cloNIDine  0.2 mg  Transdermal Weekly  . gabapentin  300 mg Oral BID  . labetalol  20 mg Intravenous TID  . metronidazole  500 mg Intravenous Q8H  . ondansetron  4 mg Intravenous Q4H  . predniSONE  10 mg Oral Q breakfast  . sertraline  75 mg Oral Daily    Continuous Infusions: . 0.9 % NaCl with KCl 20 mEq / L 75 mL/hr at 10/19/12 0912    Loyce Dys, MS RD LDN Clinical Inpatient Dietitian Pager: [phone] Weekend/After hours pager: [phone]

## 2012-10-20 ENCOUNTER — Other Ambulatory Visit: Payer: Self-pay | Admitting: *Deleted

## 2012-10-20 MED ORDER — POTASSIUM CHLORIDE CRYS ER 20 MEQ PO TBCR
40.0000 meq | EXTENDED_RELEASE_TABLET | Freq: Four times a day (QID) | ORAL | Status: AC
  Administered 2012-10-20 (×2): 40 meq via ORAL
  Filled 2012-10-20 (×2): qty 2

## 2012-10-20 MED ORDER — AMLODIPINE BESYLATE 10 MG PO TABS
10.0000 mg | ORAL_TABLET | Freq: Every day | ORAL | Status: DC
  Administered 2012-10-20 – 2012-10-22 (×3): 10 mg via ORAL
  Filled 2012-10-20 (×3): qty 1

## 2012-10-20 MED ORDER — WHITE PETROLATUM GEL
Status: AC
  Administered 2012-10-20: 0.2
  Filled 2012-10-20: qty 5

## 2012-10-20 MED ORDER — ONDANSETRON HCL 4 MG PO TABS
4.0000 mg | ORAL_TABLET | ORAL | Status: DC
  Administered 2012-10-20 – 2012-10-22 (×12): 4 mg via ORAL
  Filled 2012-10-20 (×20): qty 1

## 2012-10-20 MED ORDER — CLONIDINE HCL 0.1 MG PO TABS: 0.1000 mg | ORAL_TABLET | Freq: Every day | ORAL | Status: AC

## 2012-10-20 MED ORDER — PSYLLIUM 95 % PO PACK
1.0000 | PACK | Freq: Every day | ORAL | Status: DC
  Administered 2012-10-20 – 2012-10-22 (×3): 1 via ORAL
  Filled 2012-10-20 (×3): qty 1

## 2012-10-20 MED ORDER — CLONIDINE HCL 0.1 MG PO TABS
0.1000 mg | ORAL_TABLET | Freq: Every day | ORAL | Status: DC
  Administered 2012-10-20 – 2012-10-22 (×3): 0.1 mg via ORAL
  Filled 2012-10-20 (×3): qty 1

## 2012-10-20 MED ORDER — METRONIDAZOLE 500 MG PO TABS
500.0000 mg | ORAL_TABLET | Freq: Three times a day (TID) | ORAL | Status: DC
  Administered 2012-10-20 – 2012-10-22 (×6): 500 mg via ORAL
  Filled 2012-10-20 (×9): qty 1

## 2012-10-20 MED ORDER — CIPROFLOXACIN HCL 500 MG PO TABS
500.0000 mg | ORAL_TABLET | Freq: Two times a day (BID) | ORAL | Status: DC
  Administered 2012-10-20 – 2012-10-22 (×4): 500 mg via ORAL
  Filled 2012-10-20 (×6): qty 1

## 2012-10-20 NOTE — Clinical Social Work Note (Addendum)
Clinical Social Work Department CLINICAL SOCIAL WORK PLACEMENT NOTE 10/20/2012  Patient:  KHYLIE, LARMORE  Account Number:  [id number] Admit date:  10/14/2012  Clinical Social Worker:  Johnsie Cancel  Date/time:  10/20/2012 11:57 AM  Clinical Social Work is seeking post-discharge placement for this patient at the following level of care:   SKILLED NURSING   (*CSW will update this form in Epic as items are completed)   10/20/2012  Patient/family provided with [facility] Department of Clinical Social Work's list of facilities offering this level of care within the geographic area requested by the patient (or if unable, by the patient's family).  10/20/2012  Patient/family informed of their freedom to choose among providers that offer the needed level of care, that participate in Medicare, Medicaid or managed care program needed by the patient, have an available bed and are willing to accept the patient.  10/20/2012  Patient/family informed of [facility]' ownership interest in [facility], as well as of the fact that they are under no obligation to receive care at this facility.  PASARR submitted to EDS on 10/20/2012 : existing number PASARR number received from [employer]DS on 10/20/2012: existing number  FL2 transmitted to all facilities in geographic area requested by pt/family on  10/20/2012 FL2 transmitted to all facilities within larger geographic area on   Patient informed that his/her managed care company has contracts with or will negotiate with  certain facilities, including the following:     Patient/family informed of bed offers received:  10/22/2012 Patient chooses bed at [facility] Physician recommends and patient chooses bed at  N/A  Patient to be transferred to [facility] on 10/22/2012   Patient to be transferred to facility by [facility]  The following physician request were entered in Epic:   Additional Comments:  Lia Foyer, LCSWA [facility] Clinical Social Worker Contact #: [phone]

## 2012-10-20 NOTE — Progress Notes (Signed)
[facility] Progress Note [place] TEAM 1 - Stepdown/ICU TEAM   Jo Alvarez QQV:[id number] DOB: Jul 17, 1925 DOA: 10/14/2012 PCP: Kristian Covey, MD  Brief narrative: 79 year old female patient, resident of skilled nursing facility. Sent to ER because of abdominal pain and progressive alteration in mental status. Developed nausea, vomiting, diarrhea and abdominal pain with confusion and fever on the date of admission. Apparently was in her normal state of health 24 hours prior. Evaluation in the ER revealed significant leukocytosis with a white count greater than 25,000. CT abdomen and pelvis revealed significant distal colitis. Patient also had significant metabolic acidosis and acute renal failure. She was subsequently admitted to the step down unit.  Assessment/Plan:  Sepsis -Likely secondary to acute colitis; had associated acidosis and elevated Procalcitonin level -Continue supportive care and broad-spectrum empiric antibiotics to cover enteric pathogens. -Ordered PT/OT and patient, at that his diet carefully as tolerated.  Acute colitis (distal transverse to rectum) -likely ischemic in nature (findngs in distribution of inferior mesenteric artery which is diseased as per CT scan) -heme positive but no brisk bleeding -lactic acid improved/PCT down to 0.92 (peak 7.47) -cont empiric anbx coverage for enteric pathogens - PCR panel negative -trial clears -?persistent nausea 2/2 Dilaudid- continue IV Mso4 and scheduled Zofran   Lactic acidosis/Dehydration/persistent diarrhea -r/t dirrhea/colitis - flexiseal with green liquid stool -cont. IVF's and clears  Hypokalemia/hypomagnesemia -2/2 diarrhea - IV and oral replete - follow - Mg 1.6: replete  E. coli UTI -Sensitive to Cipro  HYPERTENSION  -BP rising-continue labetalol - slowly resume home meds as able  History of SIADH -not on meds PTA -Sodium was normal - recheck in AM  Acute respiratory failure with  hypoxia -CXR normal-wean oxygen  History temporal arteritis/psoriatic arthritis -On chronic steroids- if BP drops rec.check random cortisol -Cont. home dose oral prednisone -Was on Avada pre-admit- can likely resume since PCR negative for enteric pathogens  DVT prophylaxis: SCDs Code Status: DO NOT RESUSCITATE Family Communication: Patient-updated son Jo Alvarez 2/17 Disposition Plan: Med Surg  Consultants: Gastroenterology  Procedures: None  Antibiotics: Rocephin 2/13 >>> 2/14 Flagyl 2/13 >>>  Cipro 2/13 >>>   HPI/Subjective: Patient tolerated clears w/o abdominal pain or nausea  Objective: Blood pressure 166/67, pulse 78, temperature 98.3 F (36.8 C), temperature source Oral, resp. rate 20, height 5\' 4"  (1.626 m), weight 57.7 kg (127 lb 3.3 oz), SpO2 98.00%.  Intake/Output Summary (Last 24 hours) at 10/20/12 1201 Last data filed at 10/20/12 0900  Gross per 24 hour  Intake 1931.25 ml  Output    950 ml  Net 981.25 ml   Exam: General: No acute respiratory distress Lungs: Clear to auscultation bilaterally without wheezes or crackles, nasal cannula oxygen Cardiovascular: Regular rate and rhythm without murmur gallop or rub normal S1 and S2, no peripheral edema, no JVD, IVF at 100/hr Abdomen: Minimally tender left side, nondistended, soft, bowel sounds positive, no rebound, no ascites, no appreciable mass; still with high volume liquid stool/non-bloody per Flexi-seal Musculoskeletal: No significant cyanosis, clubbing of bilateral lower extremities Neurological: moves all extremities x4, no appreciable focal neurological deficits, cranial nerves II through XII grossly intact  Data Reviewed: Basic Metabolic Panel:  Recent Labs Lab 10/15/12 0445 10/16/12 1002 10/17/12 0520 10/17/12 0830 10/17/12 1529 10/18/12 0455 10/19/12 0520  NA 135 141 138  --   --  139 138  K 4.3 3.5 2.8*  --  2.8* 2.7* 3.3*  CL 103 108 105  --   --  106 107  CO2 24 18* 19  --   --  18* 17*   GLUCOSE 121* 90 93  --   --  78 78  BUN 30* 18 12  --   --  13 14  CREATININE 1.03 0.68 0.54  --   --  0.55 0.57  CALCIUM 7.5* 7.9* 8.0*  --   --  7.6* 7.6*  MG  --   --   --  1.7  --   --  1.6   Liver Function Tests:  Recent Labs Lab 10/14/12 1743 10/15/12 0445  AST 51* 55*  ALT 22 21  ALKPHOS 101 78  BILITOT 0.4 0.2*  PROT 6.4 5.3*  ALBUMIN 3.5 2.7*    Recent Labs Lab 10/14/12 1743  LIPASE 134*   CBC:  Recent Labs Lab 10/14/12 1743 10/15/12 0445 10/16/12 1002 10/17/12 0520 10/19/12 0520  WBC 26.6* 17.5* 17.7* 16.7* 11.9*  NEUTROABS 24.2*  --   --   --   --   HGB 12.7 11.2* 10.2* 10.0* 9.4*  HCT 39.9 34.7* 31.5* 30.1* 28.4*  MCV 93.2 91.1 91.0 89.9 89.9  PLT 375 297 268 287 291   CBG:  Recent Labs Lab 10/16/12 0141  GLUCAP 139*    Recent Results (from the past 240 hour(s))  URINE CULTURE     Status: None   Collection Time    10/14/12  5:22 PM      Result Value Range Status   Specimen Description URINE, CATHETERIZED   Final   Special Requests NONE   Final   Culture  Setup Time 10/14/2012 19:41   Final   Colony Count >=100,000 COLONIES/ML   Final   Culture ESCHERICHIA COLI   Final   Report Status 10/16/2012 FINAL   Final   Organism ID, Bacteria ESCHERICHIA COLI   Final  CULTURE, BLOOD (ROUTINE X 2)     Status: None   Collection Time    10/14/12  9:50 PM      Result Value Range Status   Specimen Description BLOOD RIGHT ARM   Final   Special Requests BOTTLES DRAWN AEROBIC AND ANAEROBIC   Final   Culture  Setup Time 10/15/2012 05:22   Final   Culture     Final   Value:        BLOOD CULTURE RECEIVED NO GROWTH TO DATE CULTURE WILL BE HELD FOR 5 DAYS BEFORE ISSUING A FINAL NEGATIVE REPORT   Report Status PENDING   Incomplete  CULTURE, BLOOD (ROUTINE X 2)     Status: None   Collection Time    10/14/12 10:10 PM      Result Value Range Status   Specimen Description BLOOD RIGHT ARM   Final   Special Requests BOTTLES DRAWN AEROBIC AND ANAEROBIC  10CC   Final   Culture  Setup Time 10/15/2012 05:22   Final   Culture     Final   Value:        BLOOD CULTURE RECEIVED NO GROWTH TO DATE CULTURE WILL BE HELD FOR 5 DAYS BEFORE ISSUING A FINAL NEGATIVE REPORT   Report Status PENDING   Incomplete  MRSA PCR SCREENING     Status: None   Collection Time    10/14/12 11:16 PM      Result Value Range Status   MRSA by PCR NEGATIVE  NEGATIVE Final   Comment:            The GeneXpert MRSA Assay (FDA     approved for NASAL specimens     only), is one  component of a     comprehensive MRSA colonization     surveillance program. It is not     intended to diagnose MRSA     infection nor to guide or     monitor treatment for     MRSA infections.  CLOSTRIDIUM DIFFICILE BY PCR     Status: None   Collection Time    10/15/12 12:58 AM      Result Value Range Status   C difficile by pcr NEGATIVE  NEGATIVE Final  STOOL CULTURE     Status: None   Collection Time    10/17/12  3:51 PM      Result Value Range Status   Specimen Description STOOL   Final   Special Requests ADDED 0334 10/18/12   Final   Culture     Final   Value: NO SUSPICIOUS COLONIES, CONTINUING TO HOLD     Note: REDUCED NORMAL FLORA PRESENT   Report Status PENDING   Incomplete     Studies:  Recent x-ray studies have been reviewed in detail by the Attending Physician  Scheduled Meds:  Reviewed in detail by the Attending Physician   LOS: 6 days   [facility] A,ANP  Pager: [phone] 10/20/2012, 12:01 PM  I have examined the patient, reviewed the chart and modified the above note which I agree with.   [facility] A,MD [phone] 10/20/2012, 12:01 PM

## 2012-10-20 NOTE — Progress Notes (Signed)
[facility] Progress Note  Subjective: No major complaints today  Objective: Vital signs in last 24 hours: Temp:  [98.2 F (36.8 C)-98.8 F (37.1 C)] 98.3 F (36.8 C) (02/19 0548) Pulse Rate:  [76-86] 78 (02/18 2044) Resp:  [20-27] 20 (02/19 0548) BP: (121-166)/(62-68) 166/67 mmHg (02/19 0548) SpO2:  [96 %-99 %] 98 % (02/19 0548) Weight:  [57.7 kg (127 lb 3.3 oz)] 57.7 kg (127 lb 3.3 oz) (02/19 0500) Weight change: 0.6 kg (1 lb 5.2 oz)   PE  Abdomen: Soft with minimal tenderness  Lab Results: No results found for this or any previous visit (from the past 24 hour(s)).  Studies/Results: @RISRSLT24 @    Assessment: Ischemic colitis, clinically improving  Plan: Continue medical management, advance diet as tolerated    GANEM,SALEM F 10/20/2012, 12:13 PM

## 2012-10-21 LAB — BASIC METABOLIC PANEL
BUN: 9 mg/dL (ref 6–23)
Calcium: 7.3 mg/dL — ABNORMAL LOW (ref 8.4–10.5)
Creatinine, Ser: 0.59 mg/dL (ref 0.50–1.10)
GFR calc Af Amer: >90 mL/min (ref >90)
GFR calc non Af Amer: 81 mL/min — ABNORMAL LOW (ref >90)
Glucose, Bld: 92 mg/dL (ref 70–99)
Potassium: 4.6 mEq/L (ref 3.5–5.1)

## 2012-10-21 LAB — CBC
MCH: 29.4 pg (ref 26.0–34.0)
MCHC: 32.6 g/dL (ref 30.0–36.0)
Platelets: 258 10*3/uL (ref 150–400)
RDW: 14.5 % (ref 11.5–15.5)

## 2012-10-21 LAB — CULTURE, BLOOD (ROUTINE X 2): Culture: NO GROWTH

## 2012-10-21 NOTE — Care Management Note (Unsigned)
    Page 1 of 1   10/21/2012     9:08:04 AM   CARE MANAGEMENT NOTE 10/21/2012  Patient:  Jo Alvarez, Jo Alvarez   Account Number:  [id number]  Date Initiated:  10/21/2012  Documentation initiated by:  Letha Cape  Subjective/Objective Assessment:   dx ischemic colisis, uti  admit- from [facility].     Action/Plan:   pt eval-recs snf   Anticipated DC Date:  10/22/2012   Anticipated DC Plan:  SKILLED NURSING FACILITY  In-house referral  Clinical Social Worker      DC Planning Services  CM consult      Choice offered to / List presented to:             Status of service:  In process, will continue to follow Medicare Important Message given?   (If response is "NO", the following Medicare IM given date fields will be blank) Date Medicare IM given:   Date Additional Medicare IM given:    Discharge Disposition:    Per UR Regulation:  Reviewed for med. necessity/level of care/duration of stay  If discussed at Long Length of Stay Meetings, dates discussed:    Comments:  10/21/12 9:05 Letha Cape RN, BSN [phone] patient is from [facility], per physical therapy recs snf, CSW following.  Per CSW [facility] is considering.

## 2012-10-21 NOTE — Progress Notes (Signed)
Physical Therapy Treatment Patient Details Name: Jo Alvarez MRN: [id number] DOB: 16-Oct-1924 Today's Date: 10/21/2012 Time: 4098-1191 PT Time Calculation (min): 15 min  PT Assessment / Plan / Recommendation Comments on Treatment Session  Pt adm with sepsis and colitis.  Pt with very limited participation due to pain and anxiety.  Unable/unwilling to try transfer to chair.    Follow Up Recommendations  SNF     Does the patient have the potential to tolerate intense rehabilitation     Barriers to Discharge        Equipment Recommendations  None recommended by PT    Recommendations for Other Services    Frequency Min 2X/week   Plan Frequency needs to be updated    Precautions / Restrictions Precautions Precautions: Fall   Pertinent Vitals/Pain Lt side rib pain and abdominal pain which improved with return to supine.    Mobility  Bed Mobility Bed Mobility: Sit to Supine Rolling Left: 2: Max assist Supine to Sit: 1: +2 Total assist Supine to Sit: Patient Percentage: 0% Sitting - Scoot to Edge of Bed: 1: +2 Total assist Sitting - Scoot to Edge of Bed: Patient Percentage: 0% Sit to Supine: 1: +2 Total assist Sit to Supine: Patient Percentage: 0% Details for Bed Mobility Assistance: Pt leaning posteriorly and pushing against bringing trunk up. Transfers Transfers: Not assessed (Due to pt unable to tolerate due to pain/anxiety)    Exercises     PT Diagnosis:    PT Problem List:   PT Treatment Interventions:     PT Goals Acute Rehab PT Goals PT Goal: Supine/Side to Sit - Progress: Not progressing PT Goal: Sit to Supine/Side - Progress: Not progressing PT Goal: Sit to Stand - Progress: Not progressing PT Goal: Stand to Sit - Progress: Not progressing PT Transfer Goal: Bed to Chair/Chair to Bed - Progress: Not progressing  Visit Information  Last PT Received On: 10/21/12 Assistance Needed: +2    Subjective Data  Subjective: "Don't make me do anymore," pt stated  while sitting EOB.   Cognition  Cognition Overall Cognitive Status: Impaired Arousal/Alertness: Awake/alert Behavior During Session: Anxious Current Attention Level: Sustained Memory: Decreased recall of precautions Executive Functioning: decrased reasoning/judgement    Balance  Static Sitting Balance Static Sitting - Balance Support: Bilateral upper extremity supported Static Sitting - Level of Assistance: 5: Stand by assistance;3: Mod assist Static Sitting - Comment/# of Minutes: Pt variable -  leaning posteriorly at times requiring mod A and then other times sits with supervision. Leaning to the lt side and propping on lt elbow at times.  End of Session PT - End of Session Activity Tolerance: Patient limited by pain Patient left: in bed;with call bell/phone within reach;with bed alarm set Nurse Communication: Mobility status   GP     [facility] 10/21/2012, 12:39 PM  [facility] PT [phone]

## 2012-10-21 NOTE — Progress Notes (Signed)
[facility] Progress Note  Subjective: The patient is feeling better. Tolerated diet.  Objective: Vital signs in last 24 hours: Temp:  [97.6 F (36.4 C)-98.3 F (36.8 C)] 98.3 F (36.8 C) (02/20 0536) Pulse Rate:  [74-80] 80 (02/20 1115) Resp:  [18-20] 20 (02/20 0536) BP: (96-143)/(40-80) 135/71 mmHg (02/20 1115) SpO2:  [95 %-97 %] 97 % (02/20 0536) Weight:  [61.7 kg (136 lb 0.4 oz)] 61.7 kg (136 lb 0.4 oz) (02/20 0537) Weight change: 4 kg (8 lb 13.1 oz)   PE  Abdomen: Soft and benign  Lab Results: Results for orders placed during the hospital encounter of 10/14/12 (from the past 24 hour(s))  BASIC METABOLIC PANEL     Status: Abnormal   Collection Time    10/21/12  6:31 AM      Result Value Range   Sodium 133 (*) 135 - 145 mEq/L   Potassium 4.6  3.5 - 5.1 mEq/L   Chloride 108  96 - 112 mEq/L   CO2 17 (*) 19 - 32 mEq/L   Glucose, Bld 92  70 - 99 mg/dL   BUN 9  6 - 23 mg/dL   Creatinine, Ser 1.61  0.50 - 1.10 mg/dL   Calcium 7.3 (*) 8.4 - 10.5 mg/dL   GFR calc non Af Amer 81 (*) >90 mL/min   GFR calc Af Amer >90  >90 mL/min  CBC     Status: Abnormal   Collection Time    10/21/12  6:31 AM      Result Value Range   WBC 10.7 (*) 4.0 - 10.5 K/uL   RBC 2.86 (*) 3.87 - 5.11 MIL/uL   Hemoglobin 8.4 (*) 12.0 - 15.0 g/dL   HCT 09.6 (*) 04.5 - 40.9 %   MCV 90.2  78.0 - 100.0 fL   MCH 29.4  26.0 - 34.0 pg   MCHC 32.6  30.0 - 36.0 g/dL   RDW 81.1  91.4 - 78.2 %   Platelets 258  150 - 400 K/uL    Studies/Results: @RISRSLT24 @    Assessment: Ischemic colitis, symptoms improving  Plan: Continue supportive care, advance diet as tolerated. We will sign off at this point. Call us again if needed.    GANEM,SALEM F 10/21/2012, 11:34 AM

## 2012-10-22 LAB — STOOL CULTURE

## 2012-10-22 LAB — BASIC METABOLIC PANEL
Calcium: 7.4 mg/dL — ABNORMAL LOW (ref 8.4–10.5)
GFR calc Af Amer: >90 mL/min (ref >90)
GFR calc non Af Amer: 86 mL/min — ABNORMAL LOW (ref >90)
Potassium: 4.2 mEq/L (ref 3.5–5.1)
Sodium: 133 mEq/L — ABNORMAL LOW (ref 135–145)

## 2012-10-22 LAB — CBC
Hemoglobin: 8.5 g/dL — ABNORMAL LOW (ref 12.0–15.0)
MCH: 29.5 pg (ref 26.0–34.0)
Platelets: 280 10*3/uL (ref 150–400)
RBC: 2.88 MIL/uL — ABNORMAL LOW (ref 3.87–5.11)

## 2012-10-22 NOTE — Progress Notes (Signed)
[facility] Progress Note [place] TEAM 1 - Stepdown/ICU TEAM   Jo Alvarez NWG:[id number] DOB: Dec 04, 1924 DOA: 10/14/2012 PCP: Kristian Covey, MD  Brief narrative: 79 year old female patient, resident of skilled nursing facility. Sent to ER because of abdominal pain and progressive alteration in mental status. Developed nausea, vomiting, diarrhea and abdominal pain with confusion and fever on the date of admission. Apparently was in her normal state of health 24 hours prior. Evaluation in the ER revealed significant leukocytosis with a white count greater than 25,000. CT abdomen and pelvis revealed significant distal colitis. Patient also had significant metabolic acidosis and acute renal failure. She was subsequently admitted to the step down unit.  Assessment/Plan:  Sepsis -Likely secondary to acute colitis; had associated acidosis and elevated Procalcitonin level -Continue supportive care and broad-spectrum empiric antibiotics to cover enteric pathogens. -Ordered PT/OT and patient, advance diet as tolerated.  Acute colitis (distal transverse to rectum) -likely ischemic in nature (findngs in distribution of inferior mesenteric artery which is diseased as per CT scan) -heme positive but no brisk bleeding -lactic acid improved/PCT down to 0.92 (peak 7.47) -cont empiric anbx coverage for enteric pathogens - PCR panel negative -trial clears -?persistent nausea 2/2 Dilaudid- continue IV Mso4 and scheduled Zofran   Lactic acidosis/Dehydration/persistent diarrhea -r/t dirrhea/colitis - flexiseal with green liquid stool -cont. IVF's and clears  Hypokalemia/hypomagnesemia -2/2 diarrhea - IV and oral replete - follow - Mg 1.6: replete  E. coli UTI -Sensitive to Cipro  HYPERTENSION  -BP rising-continue labetalol - slowly resume home meds as able  History of SIADH -not on meds PTA -Sodium was normal - recheck in AM  Acute respiratory failure with hypoxia -CXR normal-wean  oxygen  History temporal arteritis/psoriatic arthritis -On chronic steroids- if BP drops rec.check random cortisol -Cont. home dose oral prednisone -Was on Avada pre-admit- can likely resume since PCR negative for enteric pathogens  DVT prophylaxis: SCDs Code Status: DO NOT RESUSCITATE Family Communication: Patient-updated son [name] 2/17 Disposition Plan: Med Surg  Consultants: Gastroenterology  Procedures: None  Antibiotics: Rocephin 2/13 >>> 2/14 Flagyl 2/13 >>>  Cipro 2/13 >>>   HPI/Subjective: Patient tolerated clears w/o abdominal pain or nausea  Objective: Blood pressure 168/78, pulse 75, temperature 98.2 F (36.8 C), temperature source Oral, resp. rate 18, height 5\' 4"  (1.626 m), weight 61.1 kg (134 lb 11.2 oz), SpO2 95.00%.  Intake/Output Summary (Last 24 hours) at 10/22/12 1037 Last data filed at 10/22/12 0700  Gross per 24 hour  Intake   2075 ml  Output   1000 ml  Net   1075 ml   Exam: General: No acute respiratory distress Lungs: Clear to auscultation bilaterally without wheezes or crackles, nasal cannula oxygen Cardiovascular: Regular rate and rhythm without murmur gallop or rub normal S1 and S2, no peripheral edema, no JVD, IVF at 100/hr Abdomen: Minimally tender left side, nondistended, soft, bowel sounds positive, no rebound, no ascites, no appreciable mass; still with high volume liquid stool/non-bloody per Flexi-seal Musculoskeletal: No significant cyanosis, clubbing of bilateral lower extremities Neurological: moves all extremities x4, no appreciable focal neurological deficits, cranial nerves II through XII grossly intact  Data Reviewed: Basic Metabolic Panel:  Recent Labs Lab 10/17/12 0520 10/17/12 0830 10/17/12 1529 10/18/12 0455 10/19/12 0520 10/21/12 0631 10/22/12 0640  NA 138  --   --  139 138 133* 133*  K 2.8*  --  2.8* 2.7* 3.3* 4.6 4.2  CL 105  --   --  106 107 108 106  CO2 19  --   --  18* 17* 17* 19  GLUCOSE 93  --   --  78 78 92  95  BUN 12  --   --  13 14 9 7   CREATININE 0.54  --   --  0.55 0.57 0.59 0.50  CALCIUM 8.0*  --   --  7.6* 7.6* 7.3* 7.4*  MG  --  1.7  --   --  1.6  --   --    Liver Function Tests: No results found for this basename: AST, ALT, ALKPHOS, BILITOT, PROT, ALBUMIN,  in the last 168 hours No results found for this basename: LIPASE, AMYLASE,  in the last 168 hours CBC:  Recent Labs Lab 10/16/12 1002 10/17/12 0520 10/19/12 0520 10/21/12 0631 10/22/12 0640  WBC 17.7* 16.7* 11.9* 10.7* 8.8  HGB 10.2* 10.0* 9.4* 8.4* 8.5*  HCT 31.5* 30.1* 28.4* 25.8* 25.8*  MCV 91.0 89.9 89.9 90.2 89.6  PLT 268 287 291 258 280   CBG:  Recent Labs Lab 10/16/12 0141  GLUCAP 139*    Recent Results (from the past 240 hour(s))  URINE CULTURE     Status: None   Collection Time    10/14/12  5:22 PM      Result Value Range Status   Specimen Description URINE, CATHETERIZED   Final   Special Requests NONE   Final   Culture  Setup Time 10/14/2012 19:41   Final   Colony Count >=100,000 COLONIES/ML   Final   Culture ESCHERICHIA COLI   Final   Report Status 10/16/2012 FINAL   Final   Organism ID, Bacteria ESCHERICHIA COLI   Final  CULTURE, BLOOD (ROUTINE X 2)     Status: None   Collection Time    10/14/12  9:50 PM      Result Value Range Status   Specimen Description BLOOD RIGHT ARM   Final   Special Requests BOTTLES DRAWN AEROBIC AND ANAEROBIC   Final   Culture  Setup Time 10/15/2012 05:22   Final   Culture NO GROWTH 5 DAYS   Final   Report Status 10/21/2012 FINAL   Final  CULTURE, BLOOD (ROUTINE X 2)     Status: None   Collection Time    10/14/12 10:10 PM      Result Value Range Status   Specimen Description BLOOD RIGHT ARM   Final   Special Requests BOTTLES DRAWN AEROBIC AND ANAEROBIC 10CC   Final   Culture  Setup Time 10/15/2012 05:22   Final   Culture NO GROWTH 5 DAYS   Final   Report Status 10/21/2012 FINAL   Final  MRSA PCR SCREENING     Status: None   Collection Time    10/14/12  11:16 PM      Result Value Range Status   MRSA by PCR NEGATIVE  NEGATIVE Final   Comment:            The GeneXpert MRSA Assay (FDA     approved for NASAL specimens     only), is one component of a     comprehensive MRSA colonization     surveillance program. It is not     intended to diagnose MRSA     infection nor to guide or     monitor treatment for     MRSA infections.  CLOSTRIDIUM DIFFICILE BY PCR     Status: None   Collection Time    10/15/12 12:58 AM      Result Value Range Status  C difficile by pcr NEGATIVE  NEGATIVE Final  STOOL CULTURE     Status: None   Collection Time    10/17/12  3:51 PM      Result Value Range Status   Specimen Description STOOL   Final   Special Requests ADDED 0334 10/18/12   Final   Culture     Final   Value: NO SUSPICIOUS COLONIES, CONTINUING TO HOLD     Note: REDUCED NORMAL FLORA PRESENT   Report Status PENDING   Incomplete     Studies:  Recent x-ray studies have been reviewed in detail by the Attending Physician  Scheduled Meds:  Reviewed in detail by the Attending Physician   LOS: 8 days   [facility] A,ANP  Pager: [phone] 10/22/2012, 10:37 AM  I have examined the patient, reviewed the chart and modified the above note which I agree with.   [facility] A,MD [phone] 10/22/2012, 10:37 AM

## 2012-10-22 NOTE — Progress Notes (Signed)
Pt prepared for d/c to SNF. IV d/c'd. Skin intact except as most recently charted. Vitals are stable. Report called to receiving facility. Pt to be transported by ambulance service. 

## 2012-10-22 NOTE — Clinical Social Work Note (Signed)
CSW was consulted to complete discharge of patient. Pt to transfer to [facility] today via [facility]. Facility and family are aware of d/c. D/C packet complete with chart copy, signed FL2, and signed hard Rx.  CSW signing off as no other CSW needs identified at this time.  Lia Foyer, LCSWA [facility] Clinical Social Worker Contact #: [phone]

## 2012-10-22 NOTE — Discharge Summary (Signed)
Physician Discharge Summary  Jo Alvarez MVH:[id number] DOB: Mar 31, 1925 DOA: 10/14/2012  PCP: Kristian Covey, MD  Admit date: 10/14/2012 Discharge date: 10/22/2012  Time spent: 40 minutes  Recommendations for Outpatient Follow-up:  1. Followup with primary care physician in one week  Discharge Diagnoses:  Principal Problem:   Sepsis Active Problems:   LEUKOCYTOSIS   HYPERTENSION   DIARRHEA, CHRONIC   Acute colitis (distal transverse to rectum)   History of SIADH   Lactic acidosis   Dehydration   Acute respiratory failure with hypoxia   Acute renal failure   Acute tubular necrosis   Abnormal urinalysis   Discharge Condition: Stable  Diet recommendation: heart healthy diet  Filed Weights   10/20/12 0500 10/21/12 0537 10/22/12 0411  Weight: 57.7 kg (127 lb 3.3 oz) 61.7 kg (136 lb 0.4 oz) 61.1 kg (134 lb 11.2 oz)    History of present illness:  79 year old female patient, resident of assisted facility at [facility]. Sent to ER because of abdominal pain and progressive alteration in mental status. Developed nausea, vomiting, diarrhea and abdominal pain with confusion and fever on the date of admission. Apparently was in her normal state of health 24 hours prior. Evaluation in the ER revealed significant leukocytosis with a white count greater than 25,000. CT abdomen and pelvis revealed significant distal colitis. Patient also had significant metabolic acidosis and acute renal failure. She was subsequently admitted to the step down unit.  Hospital Course:    1. Acute ischemic colitis: As mentioned above patient's present in the hospital abdominal pain, leukocytosis as well as nausea and diarrhea. CT scan of abdomen pelvis showed distant colitis along from the distal transverse to the rectum. The gastroenterology service was consulted, Dr.Ganem so the patient, because of the distribution of the colitis along with the territory of the inferior mesenteric artery which showed  to be diseased on the CT scan this seems to be ischemic colitis. This was treated conservatively, no colonoscopy was done, her stool C. difficile PCR and culture came back both negative, patient was treated empirically with antibiotics including ciprofloxacin and metronidazole. Patient had high lactic acid and procalcitonin which suggesting sepsis, after the treatment with antibiotics these were gone down, patient was afebrile for the past several days and the it was felt the antibiotics were unnecessary to be continued on discharge. Patient tolerated diet very well, her loose stools improved.  2. Sepsis: This is likely secondary to the acute colitis, patient had associated acidosis and elevated procalcitonin level, patient supported with IV fluids and broad-spectrum IV antibiotics. Patient got very well and she did not have any fever for the past several days.  3. Acidosis: Patient has lactic acidosis, the acidosis also might be caused by the severe diarrhea secondary to the acute colitis. This is resolved prior to the hospital discharge. Resolved after aggressive hydration with IV fluids and resolution of the diarrhea.  4. Escherichia coli UTI: This is susceptible to ciprofloxacin, patient was treated with ciprofloxacin for total of 7 days. Patient does not have any symptoms prior to discharge.  5. History of SIADH: Patient was not on medication prior to admission, sodium level is mildly decreased at 133, patient seems to be euvolemic now without any symptoms related to her hyponatremia.  6. History of temporal arteritis and psoriatic arthritis: Patient is on chronic prednisone and leflunomide. This was continued throughout the hospital stay without any changes. Her leflunomide was held on admission because of the sepsis picture, and when the stool cultures come  back negative but was restarted.   Procedures:   none  Consultations:   gastroenterology, Dr. Evette Cristal  Discharge Exam: Filed Vitals:    10/21/12 1412 10/21/12 2216 10/22/12 0411 10/22/12 0420  BP: 129/47 123/56  168/78  Pulse: 76 76  75  Temp: 98.2 F (36.8 C) 98 F (36.7 C)  98.2 F (36.8 C)  TempSrc: Oral Oral  Oral  Resp: 20 20  18   Height:      Weight:   61.1 kg (134 lb 11.2 oz)   SpO2: 98% 96%  95%   General: Alert and awake, oriented x3, not in any acute distress. HEENT: anicteric sclera, pupils reactive to light and accommodation, EOMI CVS: S1-S2 clear, no murmur rubs or gallops Chest: clear to auscultation bilaterally, no wheezing, rales or rhonchi Abdomen: soft nontender, nondistended, normal bowel sounds, no organomegaly Extremities: no cyanosis, clubbing or edema noted bilaterally Neuro: Cranial nerves II-XII intact, no focal neurological deficits   Discharge Instructions  Discharge Orders   Future Orders Complete By Expires     Diet - low sodium heart healthy  As directed     Increase activity slowly  As directed         Medication List    STOP taking these medications       lisinopril 40 MG tablet  Commonly known as:  PRINIVIL,ZESTRIL     oxyCODONE-acetaminophen 10-325 MG per tablet  Commonly known as:  PERCOCET      TAKE these medications       amLODipine 10 MG tablet  Commonly known as:  NORVASC  Take 1 tablet (10 mg total) by mouth daily.     calcium-vitamin D 500-200 MG-UNIT per tablet  Take 1 tablet by mouth 2 (two) times daily with a meal.     cloNIDine 0.1 MG tablet  Commonly known as:  CATAPRES  Take 1 tablet (0.1 mg total) by mouth daily.     diazepam 5 MG tablet  Commonly known as:  VALIUM  Take 1 tablet (5 mg total) by mouth every 12 (twelve) hours as needed. anxiety     dimethicone-zinc oxide cream  Apply 1 application topically 2 (two) times daily. For redness on buttocks.     docusate sodium 100 MG capsule  Commonly known as:  COLACE  Take 100 mg by mouth every 12 (twelve) hours.     gabapentin 300 MG capsule  Commonly known as:  NEURONTIN  Take 1  capsule (300 mg total) by mouth 2 (two) times daily.     leflunomide 10 MG tablet  Commonly known as:  ARAVA  Take 15 mg by mouth daily.     loperamide 2 MG capsule  Commonly known as:  IMODIUM  Take 2 mg by mouth 4 (four) times daily as needed. For diarrhea     polyethylene glycol packet  Commonly known as:  MIRALAX / GLYCOLAX  Take 17 g by mouth daily.     predniSONE 10 MG tablet  Commonly known as:  DELTASONE  Take 1 tablet (10 mg total) by mouth daily.     promethazine 25 MG tablet  Commonly known as:  PHENERGAN  Take 25 mg by mouth every 6 (six) hours as needed for nausea. For nausea     ranitidine 150 MG tablet  Commonly known as:  ZANTAC  Take 1 tablet (150 mg total) by mouth 2 (two) times daily. For reflux.     senna 8.6 MG tablet  Commonly known as:  [employer]  Take 1 tablet (8.6 mg total) by mouth daily.     sertraline 50 MG tablet  Commonly known as:  ZOLOFT  Take 1.5 tablets (75 mg total) by mouth daily.     PreserVision AREDS Caps  Take 2 capsules by mouth 2 (two) times daily.     THERA M PLUS PO  Take 1 tablet by mouth daily.           Follow-up Information   Follow up with Kristian Covey, MD In 1 week.   Contact information:   [address] [place] [address] [phone]        The results of significant diagnostics from this hospitalization (including imaging, microbiology, ancillary and laboratory) are listed below for reference.    Significant Diagnostic Studies: Ct Abdomen Pelvis W Contrast  10/14/2012  *RADIOLOGY REPORT*  Clinical Data: Vomiting and diarrhea.  CT ABDOMEN AND PELVIS WITH CONTRAST  Technique:  Multidetector CT imaging of the abdomen and pelvis was performed following the standard protocol during bolus administration of intravenous contrast.  Contrast: OMNIPAQUE IOHEXOL 300 MG/ML  SOLN  Comparison: 11/08/2010.  Findings: Basilar atelectasis/scarring greater on the left.  Diffuse inflammation of the colon  extending from the distal transverse colon to the rectum consistent with prominent colitis without evidence of free intraperitoneal air.  The cause of this is indeterminate.  Gas seen within this portion colon appears to be within the lumen rather than within the bowel wall as may be seen with ischemic colitis.  The patient has an elevated white count therefore this may be related to infectious colitis.  Significant atherosclerotic type changes of the aorta and aorta branch vessel and therefore ischemic colitis not entirely excluded.  Mild prominence intrahepatic/extrahepatic biliary ducts without focal hepatic lesion. This may be related to patient's age and post cholecystectomy state.  Coarse calcification within the spleen.  Multiple renal lesions some of which are cysts others too small to adequately characterize.  No hydronephrosis.  Mild prominence of pancreatic duct without mass identified.  Prominent atherosclerotic type changes of the abdominal aorta with ectasia and narrowing but without aneurysmal dilation. Atherosclerotic type changes with plaque causing narrowing of portions of the celiac artery, superior mesenteric artery, renal arteries and inferior mesenteric artery.  Prominent plaque with prominent narrowing proximal common iliac arteries.  Scoliosis and degenerative changes lower thoracic and lumbar spine with disc space narrowing at several levels without focal compression fracture.  Prior left hip replacement.  Right hip joint degenerative changes.  Partially decompressed urinary bladder without gross abnormality.  Cardiomegaly.  Coronary artery calcifications.  Calcification aortic valve and mitral valve.  Contrast within a patulous distal esophagus may represent reflux. Left external iliac adenopathy measuring 2.1 x 1.3 cm  IMPRESSION: Significant colitis extends from the distal transverse colon to the rectum.  Please see above.  Critical Value/emergent results were called by telephone at the  time of interpretation on 10/14/2012 at 8:54 p.m. to Dr. Preston Fleeting., who verbally acknowledged these results.   Original Report Authenticated By: Lacy Duverney, M.D.    Dg Abd Portable 2v  10/17/2012  *RADIOLOGY REPORT*  Clinical Data: Colitis, abdominal pain and vomiting.  PORTABLE ABDOMEN - 2 VIEW  Comparison: 03/10 1012 radiographs and 10/14/2012 CT the  Findings: A nonspecific nonobstructive bowel gas pattern is noted with gas in nondistended colon and small bowel. There is no evidence of pneumoperitoneum. Degenerative changes and scoliosis of the lumbar spine noted as well as left hip arthroplasty. Little significant change is  identified.  IMPRESSION: Unchanged nonspecific nonobstructive bowel gas pattern - no evidence of pneumoperitoneum.   Original Report Authenticated By: Harmon Pier, M.D.     Microbiology: Recent Results (from the past 240 hour(s))  URINE CULTURE     Status: None   Collection Time    10/14/12  5:22 PM      Result Value Range Status   Specimen Description URINE, CATHETERIZED   Final   Special Requests NONE   Final   Culture  Setup Time 10/14/2012 19:41   Final   Colony Count >=100,000 COLONIES/ML   Final   Culture ESCHERICHIA COLI   Final   Report Status 10/16/2012 FINAL   Final   Organism ID, Bacteria ESCHERICHIA COLI   Final  CULTURE, BLOOD (ROUTINE X 2)     Status: None   Collection Time    10/14/12  9:50 PM      Result Value Range Status   Specimen Description BLOOD RIGHT ARM   Final   Special Requests BOTTLES DRAWN AEROBIC AND ANAEROBIC   Final   Culture  Setup Time 10/15/2012 05:22   Final   Culture NO GROWTH 5 DAYS   Final   Report Status 10/21/2012 FINAL   Final  CULTURE, BLOOD (ROUTINE X 2)     Status: None   Collection Time    10/14/12 10:10 PM      Result Value Range Status   Specimen Description BLOOD RIGHT ARM   Final   Special Requests BOTTLES DRAWN AEROBIC AND ANAEROBIC 10CC   Final   Culture  Setup Time 10/15/2012 05:22   Final   Culture NO  GROWTH 5 DAYS   Final   Report Status 10/21/2012 FINAL   Final  MRSA PCR SCREENING     Status: None   Collection Time    10/14/12 11:16 PM      Result Value Range Status   MRSA by PCR NEGATIVE  NEGATIVE Final   Comment:            The GeneXpert MRSA Assay (FDA     approved for NASAL specimens     only), is one component of a     comprehensive MRSA colonization     surveillance program. It is not     intended to diagnose MRSA     infection nor to guide or     monitor treatment for     MRSA infections.  CLOSTRIDIUM DIFFICILE BY PCR     Status: None   Collection Time    10/15/12 12:58 AM      Result Value Range Status   C difficile by pcr NEGATIVE  NEGATIVE Final  STOOL CULTURE     Status: None   Collection Time    10/17/12  3:51 PM      Result Value Range Status   Specimen Description STOOL   Final   Special Requests ADDED 0334 10/18/12   Final   Culture     Final   Value: NO SUSPICIOUS COLONIES, CONTINUING TO HOLD     Note: REDUCED NORMAL FLORA PRESENT   Report Status PENDING   Incomplete     Labs: Basic Metabolic Panel:  Recent Labs Lab 10/17/12 0520 10/17/12 0830 10/17/12 1529 10/18/12 0455 10/19/12 0520 10/21/12 0631 10/22/12 0640  NA 138  --   --  139 138 133* 133*  K 2.8*  --  2.8* 2.7* 3.3* 4.6 4.2  CL 105  --   --  106  107 108 106  CO2 19  --   --  18* 17* 17* 19  GLUCOSE 93  --   --  78 78 92 95  BUN 12  --   --  13 14 9 7   CREATININE 0.54  --   --  0.55 0.57 0.59 0.50  CALCIUM 8.0*  --   --  7.6* 7.6* 7.3* 7.4*  MG  --  1.7  --   --  1.6  --   --    Liver Function Tests: No results found for this basename: AST, ALT, ALKPHOS, BILITOT, PROT, ALBUMIN,  in the last 168 hours No results found for this basename: LIPASE, AMYLASE,  in the last 168 hours No results found for this basename: AMMONIA,  in the last 168 hours CBC:  Recent Labs Lab 10/16/12 1002 10/17/12 0520 10/19/12 0520 10/21/12 0631 10/22/12 0640  WBC 17.7* 16.7* 11.9* 10.7* 8.8  HGB  10.2* 10.0* 9.4* 8.4* 8.5*  HCT 31.5* 30.1* 28.4* 25.8* 25.8*  MCV 91.0 89.9 89.9 90.2 89.6  PLT 268 287 291 258 280   Cardiac Enzymes: No results found for this basename: CKTOTAL, CKMB, CKMBINDEX, TROPONINI,  in the last 168 hours BNP: BNP (last 3 results) No results found for this basename: PROBNP,  in the last 8760 hours CBG:  Recent Labs Lab 10/16/12 0141  GLUCAP 139*       Signed:  Leara Rawl A  [facility] 10/22/2012, 10:39 AM

## 2012-10-28 ENCOUNTER — Other Ambulatory Visit: Payer: Self-pay | Admitting: *Deleted

## 2012-10-28 MED ORDER — DOCUSATE SODIUM 100 MG PO CAPS: 100.0000 mg | ORAL_CAPSULE | Freq: Two times a day (BID) | ORAL | Status: DC

## 2012-11-09 ENCOUNTER — Other Ambulatory Visit: Payer: Self-pay | Admitting: *Deleted

## 2012-11-09 MED ORDER — SENNOSIDES 8.6 MG PO TABS: 1.0000 | ORAL_TABLET | Freq: Every day | ORAL | Status: DC

## 2012-11-17 ENCOUNTER — Other Ambulatory Visit: Payer: Self-pay | Admitting: *Deleted

## 2012-11-17 MED ORDER — THERA M PLUS PO TABS: 1.0000 | ORAL_TABLET | Freq: Every day | ORAL | Status: AC

## 2012-11-17 MED ORDER — RANITIDINE HCL 150 MG PO TABS: 150.0000 mg | ORAL_TABLET | Freq: Two times a day (BID) | ORAL | Status: AC

## 2012-11-19 ENCOUNTER — Other Ambulatory Visit: Payer: Self-pay | Admitting: *Deleted

## 2012-11-19 MED ORDER — PREDNISONE 10 MG PO TABS: 10.0000 mg | ORAL_TABLET | Freq: Every day | ORAL | Status: AC

## 2012-11-22 ENCOUNTER — Inpatient Hospital Stay (HOSPITAL_COMMUNITY)
Admission: EM | Admit: 2012-11-22 | Discharge: 2012-11-26 | DRG: 378 | Disposition: A | Discharge status: Home Health | Payer: Medicare Other | Attending: Internal Medicine | Admitting: Internal Medicine

## 2012-11-22 ENCOUNTER — Encounter (HOSPITAL_COMMUNITY): Payer: Self-pay | Admitting: Internal Medicine

## 2012-11-22 DIAGNOSIS — L8992 Pressure ulcer of unspecified site, stage 2: Secondary | ICD-10-CM

## 2012-11-22 DIAGNOSIS — K529 Noninfective gastroenteritis and colitis, unspecified: Secondary | ICD-10-CM

## 2012-11-22 DIAGNOSIS — Z66 Do not resuscitate: Secondary | ICD-10-CM | POA: Diagnosis present

## 2012-11-22 DIAGNOSIS — R5381 Other malaise: Secondary | ICD-10-CM

## 2012-11-22 DIAGNOSIS — D638 Anemia in other chronic diseases classified elsewhere: Secondary | ICD-10-CM | POA: Diagnosis present

## 2012-11-22 DIAGNOSIS — K861 Other chronic pancreatitis: Secondary | ICD-10-CM | POA: Diagnosis present

## 2012-11-22 DIAGNOSIS — G629 Polyneuropathy, unspecified: Secondary | ICD-10-CM

## 2012-11-22 DIAGNOSIS — R197 Diarrhea, unspecified: Secondary | ICD-10-CM

## 2012-11-22 DIAGNOSIS — K5289 Other specified noninfective gastroenteritis and colitis: Secondary | ICD-10-CM | POA: Diagnosis present

## 2012-11-22 DIAGNOSIS — R630 Anorexia: Secondary | ICD-10-CM

## 2012-11-22 DIAGNOSIS — I4729 Other ventricular tachycardia: Secondary | ICD-10-CM | POA: Diagnosis not present

## 2012-11-22 DIAGNOSIS — L4050 Arthropathic psoriasis, unspecified: Secondary | ICD-10-CM | POA: Diagnosis present

## 2012-11-22 DIAGNOSIS — F339 Major depressive disorder, recurrent, unspecified: Secondary | ICD-10-CM

## 2012-11-22 DIAGNOSIS — K922 Gastrointestinal hemorrhage, unspecified: Secondary | ICD-10-CM

## 2012-11-22 DIAGNOSIS — D500 Iron deficiency anemia secondary to blood loss (chronic): Secondary | ICD-10-CM | POA: Diagnosis present

## 2012-11-22 DIAGNOSIS — N170 Acute kidney failure with tubular necrosis: Secondary | ICD-10-CM

## 2012-11-22 DIAGNOSIS — M316 Other giant cell arteritis: Secondary | ICD-10-CM

## 2012-11-22 DIAGNOSIS — Z8639 Personal history of other endocrine, nutritional and metabolic disease: Secondary | ICD-10-CM

## 2012-11-22 DIAGNOSIS — E872 Acidosis, unspecified: Secondary | ICD-10-CM

## 2012-11-22 DIAGNOSIS — M1990 Unspecified osteoarthritis, unspecified site: Secondary | ICD-10-CM

## 2012-11-22 DIAGNOSIS — K559 Vascular disorder of intestine, unspecified: Secondary | ICD-10-CM | POA: Diagnosis present

## 2012-11-22 DIAGNOSIS — T148XXA Other injury of unspecified body region, initial encounter: Secondary | ICD-10-CM

## 2012-11-22 DIAGNOSIS — I472 Ventricular tachycardia, unspecified: Secondary | ICD-10-CM | POA: Diagnosis not present

## 2012-11-22 DIAGNOSIS — D72829 Elevated white blood cell count, unspecified: Secondary | ICD-10-CM

## 2012-11-22 DIAGNOSIS — R5383 Other fatigue: Secondary | ICD-10-CM

## 2012-11-22 DIAGNOSIS — I491 Atrial premature depolarization: Secondary | ICD-10-CM | POA: Diagnosis present

## 2012-11-22 DIAGNOSIS — H9190 Unspecified hearing loss, unspecified ear: Secondary | ICD-10-CM

## 2012-11-22 DIAGNOSIS — D509 Iron deficiency anemia, unspecified: Secondary | ICD-10-CM | POA: Diagnosis present

## 2012-11-22 DIAGNOSIS — F411 Generalized anxiety disorder: Secondary | ICD-10-CM | POA: Diagnosis present

## 2012-11-22 DIAGNOSIS — I10 Essential (primary) hypertension: Secondary | ICD-10-CM | POA: Diagnosis present

## 2012-11-22 DIAGNOSIS — M545 Low back pain, unspecified: Secondary | ICD-10-CM

## 2012-11-22 DIAGNOSIS — R109 Unspecified abdominal pain: Secondary | ICD-10-CM

## 2012-11-22 DIAGNOSIS — IMO0002 Reserved for concepts with insufficient information to code with codable children: Secondary | ICD-10-CM

## 2012-11-22 DIAGNOSIS — R8290 Unspecified abnormal findings in urine: Secondary | ICD-10-CM

## 2012-11-22 DIAGNOSIS — L89309 Pressure ulcer of unspecified buttock, unspecified stage: Secondary | ICD-10-CM

## 2012-11-22 DIAGNOSIS — Z79899 Other long term (current) drug therapy: Secondary | ICD-10-CM

## 2012-11-22 DIAGNOSIS — R51 Headache: Secondary | ICD-10-CM

## 2012-11-22 DIAGNOSIS — J9601 Acute respiratory failure with hypoxia: Secondary | ICD-10-CM

## 2012-11-22 DIAGNOSIS — E871 Hypo-osmolality and hyponatremia: Secondary | ICD-10-CM

## 2012-11-22 DIAGNOSIS — K625 Hemorrhage of anus and rectum: Principal | ICD-10-CM

## 2012-11-22 DIAGNOSIS — N179 Acute kidney failure, unspecified: Secondary | ICD-10-CM

## 2012-11-22 DIAGNOSIS — D649 Anemia, unspecified: Secondary | ICD-10-CM

## 2012-11-22 DIAGNOSIS — A419 Sepsis, unspecified organism: Secondary | ICD-10-CM

## 2012-11-22 DIAGNOSIS — N12 Tubulo-interstitial nephritis, not specified as acute or chronic: Secondary | ICD-10-CM

## 2012-11-22 DIAGNOSIS — E860 Dehydration: Secondary | ICD-10-CM

## 2012-11-22 DIAGNOSIS — I4891 Unspecified atrial fibrillation: Secondary | ICD-10-CM | POA: Diagnosis present

## 2012-11-22 LAB — COMPREHENSIVE METABOLIC PANEL
ALT: 13 U/L (ref 0–35)
AST: 20 U/L (ref 0–37)
Alkaline Phosphatase: 90 U/L (ref 39–117)
Calcium: 8.8 mg/dL (ref 8.4–10.5)
Potassium: 4.2 mEq/L (ref 3.5–5.1)
Sodium: 131 mEq/L — ABNORMAL LOW (ref 135–145)
Total Protein: 6.2 g/dL (ref 6.0–8.3)

## 2012-11-22 LAB — CBC WITH DIFFERENTIAL/PLATELET
Basophils Absolute: 0.1 10*3/uL (ref 0.0–0.1)
Eosinophils Absolute: 0.2 10*3/uL (ref 0.0–0.7)
Eosinophils Relative: 2 % (ref 0–5)
Lymphocytes Relative: 18 % (ref 12–46)
MCH: 29.9 pg (ref 26.0–34.0)
MCV: 92.1 fL (ref 78.0–100.0)
Neutrophils Relative %: 69 % (ref 43–77)
Platelets: 325 10*3/uL (ref 150–400)
RBC: 3.68 MIL/uL — ABNORMAL LOW (ref 3.87–5.11)
RDW: 14.7 % (ref 11.5–15.5)
WBC: 9.3 10*3/uL (ref 4.0–10.5)

## 2012-11-22 LAB — URINALYSIS, ROUTINE W REFLEX MICROSCOPIC
Leukocytes, UA: NEGATIVE
Nitrite: NEGATIVE
Protein, ur: NEGATIVE mg/dL
Urobilinogen, UA: 0.2 mg/dL (ref 0.0–1.0)

## 2012-11-22 LAB — LACTIC ACID, PLASMA: Lactic Acid, Venous: 1.2 mmol/L (ref 0.5–2.2)

## 2012-11-22 LAB — TYPE AND SCREEN: Antibody Screen: NEGATIVE

## 2012-11-22 MED ORDER — SODIUM CHLORIDE 0.9 % IV SOLN
INTRAVENOUS | Status: DC
  Administered 2012-11-22: 23:00:00 via INTRAVENOUS

## 2012-11-22 NOTE — ED Notes (Signed)
Pt seems upset, states she did not want to come to hospital today. Recently in hospital but unable to remember why. States is daughter is Charity fundraiser at [employer] and will be here soon

## 2012-11-22 NOTE — ED Provider Notes (Signed)
History     CSN: [id number]  Arrival date & time 11/22/12  1919   First MD Initiated Contact with Patient 11/22/12 2100      Chief Complaint  Patient presents with  . Rectal Bleeding   level V caveat applies secondary to dementia   HPI  History provided by the patient, nursing home report and daughter. Patient is 79 year old female with multiple medical conditions with recent history of hospitalization secondary to sepsis thought to be caused from ischemic colitis who presents from the nursing home with reports of episodes of blood with bowel movements. Patient [facility] to [facility] being in [facility] following discharge last month. Patient states she has had one to 2 normal bowel movements a day however today she had 3-4 soft bowel movements with bright red blood on the tissue and in the toilet bowl. She had no continued bleeding. She was sent by the nursing staff for concerns of this bleeding. Patient does report history of prolapsed rectum and hemorrhoids. She is unaware of any past history of diverticula. She denies any abdominal pains today. She denies any other associated symptoms. She does report generalized fatigue and weakness but this has been unchanged since her discharge.    Past Medical History  Diagnosis Date  . HYPONATREMIA 01/01/2009  . ANEMIA 08/20/2009  . LEUKOCYTOSIS 08/20/2009  . DEPRESSION, MAJOR, RECURRENT 02/01/2009  . Unspecified hearing loss 11/17/2008  . HYPERTENSION 11/17/2008  . TEMPORAL ARTERITIS 08/20/2009  . PYELONEPHRITIS 09/10/2009  . Cellulitis and abscess of upper arm and forearm 04/16/2010  . PRESSURE ULCER BUTTOCK 02/01/2009  . BACK PAIN, LUMBAR 07/29/2010  . WEAKNESS 01/01/2009  . Anorexia 12/19/2008  . Headache 04/18/2010  . DIARRHEA, CHRONIC 12/19/2008  . Stress incontinence   . Hx: UTI (urinary tract infection)   . Blood transfusion   . Foot drop, bilateral   . Clostridium difficile colitis    History of  . Pancreatitis     History of  . Anxiety   . DEGENERATIVE JOINT DISEASE 11/17/2008    shoulders, arms  . History of skin cancer   . Dysrhythmia     Hx of paroxysmal a-fib  . H/O psoriatic arthritis   . History of SIADH   . H/O hypokalemia   . H/O mitral valve prolapse   . 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 Cholecystectomy    . Abdominal hysterectomy    . Lumbar laminectomy    . Temporal artery biopsy / ligation  06/04/2009  . Right cataract extraction      Family History  Problem Relation Age of Onset  . Cancer Neg Hx     family  . Hypertension Neg Hx     family  . Arthritis Neg Hx     family    History  Substance Use Topics  . Smoking status: Never Smoker   . Smokeless tobacco: Never Used  . Alcohol Use: No    OB History   Grav Para Term Preterm Abortions TAB SAB Ect Mult Living                  Review of Systems  Unable to perform ROS: Dementia  Constitutional: Negative for fever and chills.  Respiratory: Negative for shortness of breath.   Cardiovascular: Negative for chest pain.  Gastrointestinal: Positive for blood in stool. Negative for nausea, vomiting,  abdominal pain, diarrhea, constipation and rectal pain.    Allergies  Benazepril hcl; Epinephrine hcl; and Lidocaine  Home Medications   Current Outpatient Rx  Name  Route  Sig  Dispense  Refill  . amLODipine (NORVASC) 10 MG tablet   Oral   Take 1 tablet (10 mg total) by mouth daily.   30 tablet   11   . Calcium Carbonate-Vitamin D (CALCIUM-VITAMIN D) 500-200 MG-UNIT per tablet   Oral   Take 1 tablet by mouth 2 (two) times daily with a meal.   60 tablet   11   . cloNIDine (CATAPRES) 0.1 MG tablet   Oral   Take 1 tablet (0.1 mg total) by mouth daily.   30 tablet   11   . diazepam (VALIUM) 5 MG tablet   Oral   Take 1 tablet (5 mg total) by mouth every 12 (twelve) hours as needed.  anxiety   30 tablet   5   . docusate sodium (COLACE) 100 MG capsule   Oral   Take 1 capsule (100 mg total) by mouth every 12 (twelve) hours.   60 capsule   11   . gabapentin (NEURONTIN) 300 MG capsule   Oral   Take 1 capsule (300 mg total) by mouth 2 (two) times daily.   60 capsule   5   . leflunomide (ARAVA) 10 MG tablet   Oral   Take 15 mg by mouth daily.         Marland Kitchen loperamide (IMODIUM) 2 MG capsule   Oral   Take 2 mg by mouth 4 (four) times daily as needed. For diarrhea         . Multiple Vitamins-Minerals (MULTIVITAMINS THER. W/MINERALS) TABS   Oral   Take 1 tablet by mouth daily.   30 each   5   . Multiple Vitamins-Minerals (PRESERVISION AREDS) CAPS   Oral   Take 2 capsules by mouth 2 (two) times daily.         Marland Kitchen oxyCODONE-acetaminophen (PERCOCET/ROXICET) 5-325 MG per tablet   Oral   Take 1 tablet by mouth every 6 (six) hours as needed for pain.         . polyethylene glycol (MIRALAX / GLYCOLAX) packet   Oral   Take 17 g by mouth daily.         . predniSONE (DELTASONE) 10 MG tablet   Oral   Take 1 tablet (10 mg total) by mouth daily.   30 tablet   11   . promethazine (PHENERGAN) 25 MG tablet   Oral   Take 25 mg by mouth every 6 (six) hours as needed for nausea.          . ranitidine (ZANTAC) 150 MG tablet   Oral   Take 1 tablet (150 mg total) by mouth 2 (two) times daily. For reflux.   60 tablet   5   . senna (SENOKOT) 8.6 MG tablet   Oral   Take 1 tablet (8.6 mg total) by mouth daily.   30 tablet   11   . traMADol (ULTRAM) 50 MG tablet   Oral   Take 50 mg by mouth every 6 (six) hours as needed for pain.           BP 156/73  Pulse 80  Temp(Src) 99.1 F (37.3 C) (Oral)  Resp 18  SpO2 96%  Physical Exam  Nursing note and vitals reviewed. Constitutional: She is oriented to person, place, and time. She appears  well-developed and well-nourished. No distress.  HENT:  Head: Normocephalic.  Cardiovascular: Normal rate and  regular rhythm.   Pulmonary/Chest: Effort normal and breath sounds normal. No respiratory distress.  Abdominal: Soft. She exhibits no distension. There is tenderness in the left upper quadrant and left lower quadrant. There is no rebound and no guarding.  Tenderness is mild  Musculoskeletal: Normal range of motion.  Neurological: She is alert and oriented to person, place, and time.  Skin: Skin is warm and dry.  Psychiatric: She has a normal mood and affect. Her behavior is normal.    ED Course  Procedures   Results for orders placed during the hospital encounter of 11/22/12  CBC WITH DIFFERENTIAL      Result Value Range   WBC 9.3  4.0 - 10.5 K/uL   RBC 3.68 (*) 3.87 - 5.11 MIL/uL   Hemoglobin 11.0 (*) 12.0 - 15.0 g/dL   HCT 40.9 (*) 81.1 - 91.4 %   MCV 92.1  78.0 - 100.0 fL   MCH 29.9  26.0 - 34.0 pg   MCHC 32.4  30.0 - 36.0 g/dL   RDW 78.2  95.6 - 21.3 %   Platelets 325  150 - 400 K/uL   Neutrophils Relative 69  43 - 77 %   Neutro Abs 6.4  1.7 - 7.7 K/uL   Lymphocytes Relative 18  12 - 46 %   Lymphs Abs 1.7  0.7 - 4.0 K/uL   Monocytes Relative 11  3 - 12 %   Monocytes Absolute 1.0  0.1 - 1.0 K/uL   Eosinophils Relative 2  0 - 5 %   Eosinophils Absolute 0.2  0.0 - 0.7 K/uL   Basophils Relative 1  0 - 1 %   Basophils Absolute 0.1  0.0 - 0.1 K/uL  COMPREHENSIVE METABOLIC PANEL      Result Value Range   Sodium 131 (*) 135 - 145 mEq/L   Potassium 4.2  3.5 - 5.1 mEq/L   Chloride 97  96 - 112 mEq/L   CO2 25  19 - 32 mEq/L   Glucose, Bld 84  70 - 99 mg/dL   BUN 17  6 - 23 mg/dL   Creatinine, Ser 0.86  0.50 - 1.10 mg/dL   Calcium 8.8  8.4 - 57.8 mg/dL   Total Protein 6.2  6.0 - 8.3 g/dL   Albumin 3.2 (*) 3.5 - 5.2 g/dL   AST 20  0 - 37 U/L   ALT 13  0 - 35 U/L   Alkaline Phosphatase 90  39 - 117 U/L   Total Bilirubin 0.2 (*) 0.3 - 1.2 mg/dL   GFR calc non Af Amer 82 (*) >90 mL/min   GFR calc Af Amer >90  >90 mL/min  OCCULT BLOOD, POC DEVICE      Result Value Range    Fecal Occult Bld POSITIVE (*) NEGATIVE        1. GI bleed       MDM  9:05 PM patient seen and evaluated. Patient appears well in no acute distress.  Patient seen and discussed with attending physician. Will plan on admission for at least observation of rectal bleeding given significant past history. Will add on lactate, type and screen and urinalysis.  [facility]oke with [facility]. They'll see patient and admit. Will admit patient to telemetry bed under team 8.     Angus Seller, PA-C 11/22/12 2242

## 2012-11-22 NOTE — ED Notes (Signed)
Per EMS - Pt from [facility], facility called d/t noticed BRB in soft stool. Pt states has had several BMs today and noticed blood in each of the today. BP - 139/69, HR - 83, O2 SAT 98% RA, Resp 18, CBG - 116. Pt is alert and oriented per her norm,pt is non ambulatory, stands and pivots, is w/c bound.

## 2012-11-22 NOTE — ED Notes (Signed)
Called pt's daughter-in-law, Edward Jolly, [phone], telephoned and message left that pt is being admitted to the hospital for over night stay and that a room is not yet assigned.

## 2012-11-22 NOTE — H&P (Signed)
[facility] History and Physical  Genowefa Morga XLK:[id number] DOB: [birth year] DOA: 11/22/2012  Referring physician: Mr. Theron Arista. PCP: Kristian Covey, MD  Specialists: None.  Chief Complaint: Rectal bleeding.  HPI: Jo Alvarez is a 79 y.o. female who was recently admitted last month for ischemic colitis with possible sepsis was brought from the nursing home after patient was noticed to have increased bowel movement bleeding per rectum. Patient in the ER was found to be stool for occult blood positive.This time patient does not complain of any abdominal pain but the previous. Patient denies any nausea vomiting chest pain or shortness of breath. Patient has had at least 3-4 bowel movements in the nursing home and has not had any in the ER.  Patient is hemodynamically stable and has been admitted for further management.   Review of Systems: As presented in the history of presenting illness, rest negative.  Past Medical History  Diagnosis Date  . HYPONATREMIA 01/01/2009  . ANEMIA 08/20/2009  . LEUKOCYTOSIS 08/20/2009  . DEPRESSION, MAJOR, RECURRENT 02/01/2009  . Unspecified hearing loss 11/17/2008  . HYPERTENSION 11/17/2008  . TEMPORAL ARTERITIS 08/20/2009  . PYELONEPHRITIS 09/10/2009  . Cellulitis and abscess of upper arm and forearm 04/16/2010  . PRESSURE ULCER BUTTOCK 02/01/2009  . BACK PAIN, LUMBAR 07/29/2010  . WEAKNESS 01/01/2009  . Anorexia 12/19/2008  . Headache 04/18/2010  . DIARRHEA, CHRONIC 12/19/2008  . Stress incontinence   . Hx: UTI (urinary tract infection)   . Blood transfusion   . Foot drop, bilateral   . Clostridium difficile colitis     History of  . Pancreatitis     History of  . Anxiety   . DEGENERATIVE JOINT DISEASE 11/17/2008    shoulders, arms  . History of skin cancer   . Dysrhythmia     Hx of paroxysmal a-fib  . H/O psoriatic arthritis   . History of SIADH   . H/O hypokalemia   . H/O mitral valve prolapse   . 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 Cholecystectomy    . Abdominal hysterectomy    . Lumbar laminectomy    . Temporal artery biopsy / ligation  06/04/2009  . Right cataract extraction     Social History:  reports that she has never smoked. She has never used smokeless tobacco. She reports that she does not drink alcohol or use illicit drugs.  lives at nursing home.  where does patient live--  not sure.  Can patient participate in ADLs?  Allergies  Allergen Reactions  . Benazepril Hcl     REACTION: cough  . Epinephrine Hcl     REACTION: shakes  . Lidocaine Other (See Comments)    Per MAR    Family History  Problem Relation Age of Onset  . Cancer Neg Hx     family  . Hypertension Neg Hx     family  . Arthritis Neg Hx     family      Prior to Admission medications   Medication Sig Start Date End Date Taking? Authorizing Provider  amLODipine (NORVASC) 10 MG tablet Take 1 tablet (10 mg total) by mouth daily. 04/02/12  Yes Kristian Covey, MD  Calcium Carbonate-Vitamin D (CALCIUM-VITAMIN D) 500-200 MG-UNIT per tablet Take 1 tablet by mouth 2 (two) times daily with a meal. 09/15/11  Yes Kristian Covey, MD  cloNIDine (CATAPRES) 0.1 MG tablet Take 1 tablet (0.1 mg  total) by mouth daily. 10/20/12  Yes Kristian Covey, MD  diazepam (VALIUM) 5 MG tablet Take 1 tablet (5 mg total) by mouth every 12 (twelve) hours as needed. anxiety 02/06/12  Yes Kristian Covey, MD  docusate sodium (COLACE) 100 MG capsule Take 1 capsule (100 mg total) by mouth every 12 (twelve) hours. 10/28/12  Yes Kristian Covey, MD  gabapentin (NEURONTIN) 300 MG capsule Take 1 capsule (300 mg total) by mouth 2 (two) times daily. 08/31/12 08/31/13 Yes Kristian Covey, MD  leflunomide (ARAVA) 10 MG tablet Take 15 mg by mouth daily.   Yes Historical Provider, MD  loperamide (IMODIUM) 2 MG capsule Take 2 mg by mouth 4 (four) times daily as needed. For diarrhea    Yes Historical Provider, MD  Multiple Vitamins-Minerals (MULTIVITAMINS THER. W/MINERALS) TABS Take 1 tablet by mouth daily. 11/17/12  Yes Kristian Covey, MD  Multiple Vitamins-Minerals (PRESERVISION AREDS) CAPS Take 2 capsules by mouth 2 (two) times daily. 03/22/12  Yes Kristian Covey, MD  oxyCODONE-acetaminophen (PERCOCET/ROXICET) 5-325 MG per tablet Take 1 tablet by mouth every 6 (six) hours as needed for pain.   Yes Historical Provider, MD  polyethylene glycol (MIRALAX / GLYCOLAX) packet Take 17 g by mouth daily.   Yes Historical Provider, MD  predniSONE (DELTASONE) 10 MG tablet Take 1 tablet (10 mg total) by mouth daily. 11/19/12  Yes Kristian Covey, MD  promethazine (PHENERGAN) 25 MG tablet Take 25 mg by mouth every 6 (six) hours as needed for nausea.    Yes Historical Provider, MD  ranitidine (ZANTAC) 150 MG tablet Take 1 tablet (150 mg total) by mouth 2 (two) times daily. For reflux. 11/17/12  Yes Kristian Covey, MD  senna (SENOKOT) 8.6 MG tablet Take 1 tablet (8.6 mg total) by mouth daily. 11/09/12  Yes Kristian Covey, MD  traMADol (ULTRAM) 50 MG tablet Take 50 mg by mouth every 6 (six) hours as needed for pain.   Yes Historical Provider, MD   Physical Exam: Filed Vitals:   11/22/12 1944 11/22/12 2200  BP: 156/73 167/70  Pulse: 80 77  Temp: 99.1 F (37.3 C)   TempSrc: Oral   Resp: 18   SpO2: 96% 95%     General:   well-developed and nourished.  Eyes:  anicteric no pallor.  ENT:  no discharge from the ears eyes nose and mouth.  Neck:  no mass or JVD.   Cardiovascular:  S1-S2 heard. Regular.   Respiratory:  no rhonchi or crepitations.   Abdomen:  soft nontender bowel sounds present.  Skin:  no obvious rash seen.   Musculoskeletal:  no edema.   Psychiatric:  alert awake oriented to her name.   Neurologic:  moves all extremities.   Labs on Admission:  Basic Metabolic Panel:  Recent Labs Lab 11/22/12 2005  NA 131*  K 4.2  CL 97  CO2 25  GLUCOSE 84   BUN 17  CREATININE 0.57  CALCIUM 8.8   Liver Function Tests:  Recent Labs Lab 11/22/12 2005  AST 20  ALT 13  ALKPHOS 90  BILITOT 0.2*  PROT 6.2  ALBUMIN 3.2*   No results found for this basename: LIPASE, AMYLASE,  in the last 168 hours No results found for this basename: AMMONIA,  in the last 168 hours CBC:  Recent Labs Lab 11/22/12 2005  WBC 9.3  NEUTROABS 6.4  HGB 11.0*  HCT 33.9*  MCV 92.1  PLT 325   Cardiac Enzymes: No results  found for this basename: CKTOTAL, CKMB, CKMBINDEX, TROPONINI,  in the last 168 hours  BNP (last 3 results) No results found for this basename: PROBNP,  in the last 8760 hours CBG: No results found for this basename: GLUCAP,  in the last 168 hours  Radiological Exams on Admission: No results found.  Assessment/Plan Principal Problem:   Rectal bleed Active Problems:   HYPERTENSION   TEMPORAL ARTERITIS   Ischemic colitis   HTN (hypertension)   1. Rectal bleeding  - presently hemodynamically stable. Type and screen and closely follow CBC. Bleeding could be most likely from lower GI but at this time we'll also place patient on Protonix IV. Since patient is mildly febrile I have placed patient on Cipro and Flagyl. Check stool for C. difficile PCR. Consult GI. 2. History of hypertension - continue present medication and closely monitor blood pressure trends. 3. History of temporal arteritis on steroids - continue prednisone. I have ordered one dose of hydrocortisone IV as stress dose. 4. Anemia probably from blood loss - closely follow CBC. 5. History of psoriatic arthritis  - continue present medications.  6. Recently admitted for sepsis from ischemic colitis - presently hemodynamically stable. Check lactic acid levels.    Code Status:  DO NOT RESUSCITATE.   Family Communication:  none.   Disposition Plan: Admit to inpatient.     Kamyiah Colantonio N. [facility] Pager [phone].   If 7PM-7AM, please contact  night-coverage [email or web address] Password [facility] 11/22/2012, 11:19 PM

## 2012-11-22 NOTE — ED Notes (Signed)
ZOX:[id number]<AV> Expected date:<BR> Expected time:<BR> Means of arrival:Ambulance<BR> Comments:<BR> Rectal bleeding

## 2012-11-23 ENCOUNTER — Telehealth: Payer: Self-pay | Admitting: *Deleted

## 2012-11-23 DIAGNOSIS — K922 Gastrointestinal hemorrhage, unspecified: Secondary | ICD-10-CM

## 2012-11-23 DIAGNOSIS — D649 Anemia, unspecified: Secondary | ICD-10-CM

## 2012-11-23 DIAGNOSIS — E871 Hypo-osmolality and hyponatremia: Secondary | ICD-10-CM

## 2012-11-23 LAB — CBC WITH DIFFERENTIAL/PLATELET
Basophils Absolute: 0.1 10*3/uL (ref 0.0–0.1)
Eosinophils Absolute: 0.1 10*3/uL (ref 0.0–0.7)
Eosinophils Relative: 2 % (ref 0–5)
Lymphs Abs: 1.1 10*3/uL (ref 0.7–4.0)
MCH: 30.4 pg (ref 26.0–34.0)
MCV: 92.3 fL (ref 78.0–100.0)
Platelets: 253 10*3/uL (ref 150–400)
RDW: 15 % (ref 11.5–15.5)

## 2012-11-23 LAB — TSH: TSH: 1.32 u[IU]/mL (ref 0.350–4.500)

## 2012-11-23 LAB — FERRITIN: Ferritin: 51 ng/mL (ref 10–291)

## 2012-11-23 LAB — MRSA PCR SCREENING: MRSA by PCR: NEGATIVE

## 2012-11-23 LAB — ABO/RH: ABO/RH(D): O POS

## 2012-11-23 LAB — IRON AND TIBC: UIBC: 228 ug/dL (ref 125–400)

## 2012-11-23 LAB — MAGNESIUM: Magnesium: 2 mg/dL (ref 1.5–2.5)

## 2012-11-23 LAB — PROTIME-INR: Prothrombin Time: 14.3 seconds (ref 11.6–15.2)

## 2012-11-23 MED ORDER — FAMOTIDINE 20 MG PO TABS
20.0000 mg | ORAL_TABLET | Freq: Two times a day (BID) | ORAL | Status: DC
  Administered 2012-11-23 – 2012-11-26 (×7): 20 mg via ORAL
  Filled 2012-11-23 (×8): qty 1

## 2012-11-23 MED ORDER — CLONIDINE HCL 0.1 MG PO TABS
0.1000 mg | ORAL_TABLET | Freq: Every day | ORAL | Status: DC
  Administered 2012-11-23 – 2012-11-26 (×4): 0.1 mg via ORAL
  Filled 2012-11-23 (×5): qty 1

## 2012-11-23 MED ORDER — SODIUM CHLORIDE 0.9 % IV SOLN
INTRAVENOUS | Status: DC
  Administered 2012-11-23 (×2): via INTRAVENOUS

## 2012-11-23 MED ORDER — GABAPENTIN 300 MG PO CAPS
300.0000 mg | ORAL_CAPSULE | Freq: Two times a day (BID) | ORAL | Status: DC
  Administered 2012-11-23 – 2012-11-26 (×7): 300 mg via ORAL
  Filled 2012-11-23 (×8): qty 1

## 2012-11-23 MED ORDER — ACETAMINOPHEN 650 MG RE SUPP: 650.0000 mg | Freq: Four times a day (QID) | RECTAL | Status: DC | PRN

## 2012-11-23 MED ORDER — OXYCODONE-ACETAMINOPHEN 5-325 MG PO TABS
1.0000 | ORAL_TABLET | Freq: Four times a day (QID) | ORAL | Status: DC | PRN
  Administered 2012-11-23 – 2012-11-26 (×5): 1 via ORAL
  Filled 2012-11-23 (×5): qty 1

## 2012-11-23 MED ORDER — DIAZEPAM 5 MG PO TABS: 5.0000 mg | ORAL_TABLET | Freq: Two times a day (BID) | ORAL | Status: DC | PRN

## 2012-11-23 MED ORDER — HYDROCORTISONE SOD SUCCINATE 100 MG IJ SOLR
50.0000 mg | Freq: Once | INTRAMUSCULAR | Status: AC
  Administered 2012-11-23: 50 mg via INTRAVENOUS
  Filled 2012-11-23: qty 1

## 2012-11-23 MED ORDER — METRONIDAZOLE IN NACL 5-0.79 MG/ML-% IV SOLN
500.0000 mg | Freq: Three times a day (TID) | INTRAVENOUS | Status: DC
  Administered 2012-11-23 – 2012-11-26 (×10): 500 mg via INTRAVENOUS
  Filled 2012-11-23 (×11): qty 100

## 2012-11-23 MED ORDER — PANTOPRAZOLE SODIUM 40 MG IV SOLR
40.0000 mg | INTRAVENOUS | Status: DC
  Administered 2012-11-23 – 2012-11-25 (×3): 40 mg via INTRAVENOUS
  Filled 2012-11-23 (×4): qty 40

## 2012-11-23 MED ORDER — AMLODIPINE BESYLATE 10 MG PO TABS
10.0000 mg | ORAL_TABLET | Freq: Every day | ORAL | Status: DC
Start: 2012-11-23 — End: 2012-11-26
  Administered 2012-11-23 – 2012-11-26 (×4): 10 mg via ORAL
  Filled 2012-11-23 (×4): qty 1

## 2012-11-23 MED ORDER — SODIUM CHLORIDE 0.9 % IV SOLN
INTRAVENOUS | Status: DC
  Administered 2012-11-23: 03:00:00 via INTRAVENOUS

## 2012-11-23 MED ORDER — LEFLUNOMIDE 10 MG PO TABS
15.0000 mg | ORAL_TABLET | Freq: Every day | ORAL | Status: DC
  Administered 2012-11-23 – 2012-11-26 (×4): 15 mg via ORAL
  Filled 2012-11-23 (×4): qty 1.5

## 2012-11-23 MED ORDER — ONDANSETRON HCL 4 MG PO TABS: 4.0000 mg | ORAL_TABLET | Freq: Four times a day (QID) | ORAL | Status: DC | PRN

## 2012-11-23 MED ORDER — PREDNISONE 10 MG PO TABS
10.0000 mg | ORAL_TABLET | Freq: Every day | ORAL | Status: DC
  Administered 2012-11-23 – 2012-11-26 (×4): 10 mg via ORAL
  Filled 2012-11-23 (×4): qty 1

## 2012-11-23 MED ORDER — ACETAMINOPHEN 325 MG PO TABS: 650.0000 mg | ORAL_TABLET | Freq: Four times a day (QID) | ORAL | Status: DC | PRN

## 2012-11-23 MED ORDER — ONDANSETRON HCL 4 MG/2ML IJ SOLN: 4.0000 mg | Freq: Four times a day (QID) | INTRAMUSCULAR | Status: DC | PRN

## 2012-11-23 MED ORDER — CIPROFLOXACIN IN D5W 400 MG/200ML IV SOLN
400.0000 mg | Freq: Two times a day (BID) | INTRAVENOUS | Status: DC
  Administered 2012-11-23 – 2012-11-25 (×7): 400 mg via INTRAVENOUS
  Filled 2012-11-23 (×8): qty 200

## 2012-11-23 MED ORDER — HYDRALAZINE HCL 20 MG/ML IJ SOLN
10.0000 mg | INTRAMUSCULAR | Status: DC | PRN
  Administered 2012-11-23 – 2012-11-26 (×2): 10 mg via INTRAVENOUS
  Filled 2012-11-23 (×2): qty 1

## 2012-11-23 MED ORDER — SODIUM CHLORIDE 0.9 % IJ SOLN
3.0000 mL | Freq: Two times a day (BID) | INTRAMUSCULAR | Status: DC
  Administered 2012-11-23 – 2012-11-25 (×4): 3 mL via INTRAVENOUS

## 2012-11-23 MED ORDER — TRAMADOL HCL 50 MG PO TABS
50.0000 mg | ORAL_TABLET | Freq: Four times a day (QID) | ORAL | Status: DC | PRN
  Administered 2012-11-24 – 2012-11-25 (×2): 50 mg via ORAL
  Filled 2012-11-23 (×3): qty 1

## 2012-11-23 NOTE — ED Provider Notes (Signed)
Medical screening examination/treatment/procedure(s) were performed by non-physician practitioner and as supervising physician I was immediately available for consultation/collaboration.  Toy Baker, MD 11/23/12 [phone]

## 2012-11-23 NOTE — Telephone Encounter (Signed)
We can provide orders requested but would probably wait until after this hospitalization to set up in case needs change.

## 2012-11-23 NOTE — Progress Notes (Signed)
[facility] PROGRESS NOTE  Jo Alvarez ZOX:[id number] DOB: 27-Nov-1924 DOA: 11/22/2012 PCP: Kristian Covey, MD  Assessment/Plan: Rectal bleeding-painless -Recent admission 2/13-2/21 for ischemic colitis -Hemoglobin is stable-->11.0 (3/24) -Hgb on 10/22/12 = 8.5 -No abdominal pain, no leukocytosis, normal lactic acid--doubt ischemic colitis -Reconsulted [facility] -Check coags -no BM since admission Cardiac ectopy -History of paroxysmal atrial fibrillation -sinus on tele with PACs and nonconducted P-waves -Check EKG -Optimize electrolytes Hypertension -Continue amlodipine, clonidine Temporal arteritis -Continue maintenance dose of prednisone 10 mg daily Psoriatic arthritis -Continue Arava Hyponatremia -Has history of hyponatremia secondary to SIADH -Currently asymptomatic -Check TSH Anemia of chronic disease -Check iron studies, B12, RBC folate -Saline lock IV normal saline, has received 1 L    Family Communication:   Pt at beside Disposition Plan:   Home when medically stable           Subjective: Patient denies fevers, chills, chest pain, shortness breath, dizziness, nausea, vomiting, abdominal pain, dysuria, hematuria.  Objective: Filed Vitals:   11/23/12 0106 11/23/12 0132 11/23/12 0517 11/23/12 0700  BP: 160/73 183/70 176/58 134/65  Pulse: 76 84 69 83  Temp: 98.9 F (37.2 C) 98.2 F (36.8 C) 99.4 F (37.4 C) 98.7 F (37.1 C)  TempSrc: Oral Oral Oral Oral  Resp: 16 18 20    Height:  5\' 8"  (1.727 m)    Weight:  53.615 kg (118 lb 3.2 oz)    SpO2: 97% 98% 96% 99%   No intake or output data in the 24 hours ending 11/23/12 0844 Weight change:  Exam:   General:  Pt is alert, follows commands appropriately, not in acute distress  HEENT: No icterus, No thrush,  [place]/AT  Cardiovascular: RRR, S1/S2, no rubs, PACs  Respiratory: CTA bilaterally, no wheezing, no crackles, no rhonchi  Abdomen: Soft/+BS, non tender, non distended, no  guarding  Extremities: No edema, No lymphangitis, No petechiae, No rashes, no synovitis  Data Reviewed: Basic Metabolic Panel:  Recent Labs Lab 11/22/12 2005  NA 131*  K 4.2  CL 97  CO2 25  GLUCOSE 84  BUN 17  CREATININE 0.57  CALCIUM 8.8   Liver Function Tests:  Recent Labs Lab 11/22/12 2005  AST 20  ALT 13  ALKPHOS 90  BILITOT 0.2*  PROT 6.2  ALBUMIN 3.2*   No results found for this basename: LIPASE, AMYLASE,  in the last 168 hours No results found for this basename: AMMONIA,  in the last 168 hours CBC:  Recent Labs Lab 11/22/12 2005 11/23/12 0445  WBC 9.3 8.1  NEUTROABS 6.4 6.1  HGB 11.0* 9.5*  HCT 33.9* 28.9*  MCV 92.1 92.3  PLT 325 253   Cardiac Enzymes: No results found for this basename: CKTOTAL, CKMB, CKMBINDEX, TROPONINI,  in the last 168 hours BNP: No components found with this basename: POCBNP,  CBG: No results found for this basename: GLUCAP,  in the last 168 hours  Recent Results (from the past 240 hour(s))  MRSA PCR SCREENING     Status: None   Collection Time    11/23/12  2:10 AM      Result Value Range Status   MRSA by PCR NEGATIVE  NEGATIVE Final   Comment:            The GeneXpert MRSA Assay (FDA     approved for NASAL specimens     only), is one component of a     comprehensive MRSA colonization     surveillance program. It is not  intended to diagnose MRSA     infection nor to guide or     monitor treatment for     MRSA infections.     Scheduled Meds: . amLODipine  10 mg Oral Daily  . ciprofloxacin  400 mg Intravenous BID  . cloNIDine  0.1 mg Oral Daily  . famotidine  20 mg Oral BID  . gabapentin  300 mg Oral BID  . leflunomide  15 mg Oral Daily  . metronidazole  500 mg Intravenous Q8H  . pantoprazole (PROTONIX) IV  40 mg Intravenous Q24H  . predniSONE  10 mg Oral Daily  . sodium chloride  3 mL Intravenous Q12H   Continuous Infusions: . sodium chloride 100 mL/hr at 11/23/12 0231     Madilyn Cephas, DO  [facility] Pager [phone]  If 7PM-7AM, please contact night-coverage [email or web address] Password [facility] 11/23/2012, 8:44 AM   LOS: 1 day

## 2012-11-23 NOTE — Consult Note (Signed)
Referring Provider: Dr. Toniann Fail Primary Care Physician:  Kristian Covey, MD Primary Gastroenterologist: Dr. Dulce Sellar  Reason for Consultation:  GI bleed  HPI: Jo Alvarez is a 79 y.o. female who was in the hospital last month for presumed ischemic colitis and possible sepsis was sent back from the nursing home after having a bloody loose stool. She denies any blood since last month's discharge until last night. She is having nonbloody loose stools now. Dr. Evette Cristal saw her in consultation last month and he felt her symptoms were due to ischemic colitis. CT at that time showed diffuse left-sided colitis. She denies any abdominal pain/N/V now or yesterday. Her last colonoscopy was in 2010 by Dr. Elnoria Howard that showed sigmoid diverticulosis. There was concern in the past of possible rectal prolapse although on exam by Dr. Dulce Sellar in 2012 there was no evidence of that.    Past Medical History  Diagnosis Date  . HYPONATREMIA 01/01/2009  . ANEMIA 08/20/2009  . LEUKOCYTOSIS 08/20/2009  . DEPRESSION, MAJOR, RECURRENT 02/01/2009  . Unspecified hearing loss 11/17/2008  . HYPERTENSION 11/17/2008  . TEMPORAL ARTERITIS 08/20/2009  . PYELONEPHRITIS 09/10/2009  . Cellulitis and abscess of upper arm and forearm 04/16/2010  . PRESSURE ULCER BUTTOCK 02/01/2009  . BACK PAIN, LUMBAR 07/29/2010  . WEAKNESS 01/01/2009  . Anorexia 12/19/2008  . Headache 04/18/2010  . DIARRHEA, CHRONIC 12/19/2008  . Stress incontinence   . Hx: UTI (urinary tract infection)   . Blood transfusion   . Foot drop, bilateral   . Clostridium difficile colitis     History of  . Pancreatitis     History of  . Anxiety   . DEGENERATIVE JOINT DISEASE 11/17/2008    shoulders, arms  . History of skin cancer   . Dysrhythmia     Hx of paroxysmal a-fib  . H/O psoriatic arthritis   . History of SIADH   . H/O hypokalemia   . H/O mitral valve prolapse   . Chronic inflammatory demyelinating neuropathy     motor and sensory    Past Surgical  History  Procedure Laterality Date  . Cholecystectomy    . Abdominal hysterectomy    . Lumbar laminectomy    . Temporal artery biopsy / ligation  06/04/2009  . Right cataract extraction      Prior to Admission medications   Medication Sig Start Date End Date Taking? Authorizing Provider  amLODipine (NORVASC) 10 MG tablet Take 1 tablet (10 mg total) by mouth daily. 04/02/12  Yes Kristian Covey, MD  Calcium Carbonate-Vitamin D (CALCIUM-VITAMIN D) 500-200 MG-UNIT per tablet Take 1 tablet by mouth 2 (two) times daily with a meal. 09/15/11  Yes Kristian Covey, MD  cloNIDine (CATAPRES) 0.1 MG tablet Take 1 tablet (0.1 mg total) by mouth daily. 10/20/12  Yes Kristian Covey, MD  diazepam (VALIUM) 5 MG tablet Take 1 tablet (5 mg total) by mouth every 12 (twelve) hours as needed. anxiety 02/06/12  Yes Kristian Covey, MD  docusate sodium (COLACE) 100 MG capsule Take 1 capsule (100 mg total) by mouth every 12 (twelve) hours. 10/28/12  Yes Kristian Covey, MD  gabapentin (NEURONTIN) 300 MG capsule Take 1 capsule (300 mg total) by mouth 2 (two) times daily. 08/31/12 08/31/13 Yes Kristian Covey, MD  leflunomide (ARAVA) 10 MG tablet Take 15 mg by mouth daily.   Yes Historical Provider, MD  loperamide (IMODIUM) 2 MG capsule Take 2 mg by mouth 4 (four) times daily as needed. For diarrhea  Yes Historical Provider, MD  Multiple Vitamins-Minerals (MULTIVITAMINS THER. W/MINERALS) TABS Take 1 tablet by mouth daily. 11/17/12  Yes Kristian Covey, MD  Multiple Vitamins-Minerals (PRESERVISION AREDS) CAPS Take 2 capsules by mouth 2 (two) times daily. 03/22/12  Yes Kristian Covey, MD  oxyCODONE-acetaminophen (PERCOCET/ROXICET) 5-325 MG per tablet Take 1 tablet by mouth every 6 (six) hours as needed for pain.   Yes Historical Provider, MD  polyethylene glycol (MIRALAX / GLYCOLAX) packet Take 17 g by mouth daily.   Yes Historical Provider, MD  predniSONE (DELTASONE) 10 MG tablet Take 1 tablet (10 mg total) by  mouth daily. 11/19/12  Yes Kristian Covey, MD  promethazine (PHENERGAN) 25 MG tablet Take 25 mg by mouth every 6 (six) hours as needed for nausea.    Yes Historical Provider, MD  ranitidine (ZANTAC) 150 MG tablet Take 1 tablet (150 mg total) by mouth 2 (two) times daily. For reflux. 11/17/12  Yes Kristian Covey, MD  senna (SENOKOT) 8.6 MG tablet Take 1 tablet (8.6 mg total) by mouth daily. 11/09/12  Yes Kristian Covey, MD  traMADol (ULTRAM) 50 MG tablet Take 50 mg by mouth every 6 (six) hours as needed for pain.   Yes Historical Provider, MD    Scheduled Meds: . amLODipine  10 mg Oral Daily  . ciprofloxacin  400 mg Intravenous BID  . cloNIDine  0.1 mg Oral Daily  . famotidine  20 mg Oral BID  . gabapentin  300 mg Oral BID  . leflunomide  15 mg Oral Daily  . metronidazole  500 mg Intravenous Q8H  . pantoprazole (PROTONIX) IV  40 mg Intravenous Q24H  . predniSONE  10 mg Oral Daily  . sodium chloride  3 mL Intravenous Q12H   Continuous Infusions:  PRN Meds:.acetaminophen, acetaminophen, diazepam, hydrALAZINE, ondansetron (ZOFRAN) IV, ondansetron, oxyCODONE-acetaminophen, traMADol  Allergies as of 11/22/2012 - Review Complete 11/22/2012  Allergen Reaction Noted  . Benazepril hcl  12/19/2008  . Epinephrine hcl  12/19/2008  . Lidocaine Other (See Comments) 11/08/2010    Family History  Problem Relation Age of Onset  . Cancer Neg Hx     family  . Hypertension Neg Hx     family  . Arthritis Neg Hx     family    History   Social History  . Marital Status: Widowed    Spouse Name: N/A    Number of Children: N/A  . Years of Education: N/A   Occupational History  . Not on file.   Social History Main Topics  . Smoking status: Never Smoker   . Smokeless tobacco: Never Used  . Alcohol Use: No  . Drug Use: No  . Sexually Active: No   Other Topics Concern  . Not on file   Social History Narrative  . No narrative on file    Review of Systems: All negative except as  stated above in HPI.  Physical Exam: Vital signs: Filed Vitals:   11/23/12 0700  BP: 134/65  Pulse: 83  Temp: 98.7 F (37.1 C)  Resp: 20   Last BM Date: 11/23/12  General:   Elderly, Alert,  Thin, pleasant and cooperative in NAD Lungs:  Clear throughout to auscultation.   No wheezes, crackles, or rhonchi. No acute distress. Heart:  Regular rate and rhythm; no murmurs, clicks, rubs,  or gallops. Abdomen: soft, mild tenderness in LLQ with guarding, otherwise nontender, nondistended, +BS  Rectal:  Deferred Ext: no edema  GI:  Lab Results:  Recent Labs  11/22/12 2005 11/23/12 0445  WBC 9.3 8.1  HGB 11.0* 9.5*  HCT 33.9* 28.9*  PLT 325 253   BMET  Recent Labs  11/22/12 2005  NA 131*  K 4.2  CL 97  CO2 25  GLUCOSE 84  BUN 17  CREATININE 0.57  CALCIUM 8.8   LFT  Recent Labs  11/22/12 2005  PROT 6.2  ALBUMIN 3.2*  AST 20  ALT 13  ALKPHOS 90  BILITOT 0.2*   PT/INR  Recent Labs  11/23/12 0920  LABPROT 14.3  INR 1.13     Studies/Results: No results found.  Impression/Plan: 79 yo with one episode of bloody loose stool yesterday and likely had ischemic colitis last month. No further bleeding but having loose stools so agree with ruling out C. Diff again (negative last month). Would NOT recommend a colonoscopy/sigmoidoscopy. Would recommend supportive care, stool studies, Cipro/Flagyl, IVFs, clear liquids. Will follow.    LOS: 1 day   Zabdiel Dripps C.  11/23/2012, 10:33 AM

## 2012-11-23 NOTE — Progress Notes (Signed)
ANTIBIOTIC CONSULT NOTE - INITIAL  Pharmacy Consult for cipro Indication: Colitis  Allergies  Allergen Reactions  . Benazepril Hcl     REACTION: cough  . Epinephrine Hcl     REACTION: shakes  . Lidocaine Other (See Comments)    Per [facility]    Patient Measurements: Height: 5\' 8"  (172.7 cm) Weight: 118 lb 3.2 oz (53.615 kg) IBW/kg (Calculated) : 63.9 Adjusted Body Weight:   Vital Signs: Temp: 98.2 F (36.8 C) (03/25 0132) Temp src: Oral (03/25 0132) BP: 183/70 mmHg (03/25 0132) Pulse Rate: 84 (03/25 0132) Intake/Output from previous day:   Intake/Output from this shift:    Labs:  Recent Labs  11/22/12 2005  WBC 9.3  HGB 11.0*  PLT 325  CREATININE 0.57   Estimated Creatinine Clearance: 42.7 ml/min (by C-G formula based on Cr of 0.57). No results found for this basename: VANCOTROUGH, VANCOPEAK, VANCORANDOM, GENTTROUGH, GENTPEAK, GENTRANDOM, TOBRATROUGH, TOBRAPEAK, TOBRARND, AMIKACINPEAK, AMIKACINTROU, AMIKACIN,  in the last 72 hours   Microbiology: No results found for this or any previous visit (from the past 720 hour(s)).  Medical History: Past Medical History  Diagnosis Date  . HYPONATREMIA 01/01/2009  . ANEMIA 08/20/2009  . LEUKOCYTOSIS 08/20/2009  . DEPRESSION, MAJOR, RECURRENT 02/01/2009  . Unspecified hearing loss 11/17/2008  . HYPERTENSION 11/17/2008  . TEMPORAL ARTERITIS 08/20/2009  . PYELONEPHRITIS 09/10/2009  . Cellulitis and abscess of upper arm and forearm 04/16/2010  . PRESSURE ULCER BUTTOCK 02/01/2009  . BACK PAIN, LUMBAR 07/29/2010  . WEAKNESS 01/01/2009  . Anorexia 12/19/2008  . Headache 04/18/2010  . DIARRHEA, CHRONIC 12/19/2008  . Stress incontinence   . Hx: UTI (urinary tract infection)   . Blood transfusion   . Foot drop, bilateral   . Clostridium difficile colitis     History of  . Pancreatitis     History of  . Anxiety   . DEGENERATIVE JOINT DISEASE 11/17/2008    shoulders, arms  . History of skin cancer   . Dysrhythmia     Hx of  paroxysmal a-fib  . H/O psoriatic arthritis   . History of SIADH   . H/O hypokalemia   . H/O mitral valve prolapse   . Chronic inflammatory demyelinating neuropathy     motor and sensory    Medications:  Anti-infectives   Start     Dose/Rate Route Frequency Ordered Stop   11/23/12 0200  metroNIDAZOLE (FLAGYL) IVPB 500 mg     500 mg 100 mL/hr over 60 Minutes Intravenous 3 times per day 11/23/12 0141     11/23/12 0145  ciprofloxacin (CIPRO) IVPB 400 mg     400 mg 200 mL/hr over 60 Minutes Intravenous 2 times daily 11/23/12 0144       Assessment: Patient with Colitis.    Goal of Therapy:  Cipro dosed based on patient weight and renal function   Plan:  Follow up culture results Cipro 400mg  iv q12hr  Aleene Davidson Crowford 11/23/2012,1:48 AM

## 2012-11-23 NOTE — Telephone Encounter (Signed)
I called Audiological [facility] re: 2 messages/order request.    1.)  Return from rehab following hospital stay with order to crush meds and have honey thick nectar liquids.  I spoke with a med tech and she suggested an order requesting speech therapy eval to determine need for crush med, if so , which ones can be crusher, etc.  2.  )Due to prolonged hospitalization, rectal prolapse, requesting written orders for Home therapy RN to observe d/t pain and co-morbidities.  Also requesting orders for PT and OT, eval and treat.  PS.  Nurse Artist Pais also informed me Ms. Lindy was taken back to the hospital for rectal bleeding last night.

## 2012-11-23 NOTE — Progress Notes (Signed)
Pt had two consecutive high BP's this morning. MD notified. Awaiting orders and will continue to monitor patient. - Christell Faith, RN

## 2012-11-23 NOTE — Progress Notes (Signed)
   CARE MANAGEMENT NOTE 11/23/2012  Patient:  Jo Alvarez, Jo Alvarez   Account Number:  [id number]  Date Initiated:  11/23/2012  Documentation initiated by:  Jiles Crocker  Subjective/Objective Assessment:   ADMITTED WITH RECTAL BLEED     Action/Plan:   PCP: Kristian Covey, MD  PATIENT RESIDES IN AN ASSISTED LIVING FACILITY; SOC WORKER REFERRAL PLACED   Anticipated DC Date:  11/27/2012   Anticipated DC Plan:  ASSISTED LIVING / REST HOME  In-house referral  Clinical Social Worker      DC Associate Professor  CM consult          Status of service:  In process, will continue to follow Medicare Important Message given?  NA - LOS <3 / Initial given by admissions (If response is "NO", the following Medicare IM given date fields will be blank) Per UR Regulation:  Reviewed for med. necessity/level of care/duration of stay Comments:  11/23/2012- B Rayman Petrosian RN,BSN,MHA

## 2012-11-24 DIAGNOSIS — R197 Diarrhea, unspecified: Secondary | ICD-10-CM

## 2012-11-24 DIAGNOSIS — K5289 Other specified noninfective gastroenteritis and colitis: Secondary | ICD-10-CM

## 2012-11-24 LAB — FOLATE RBC: RBC Folate: 1587 ng/mL — ABNORMAL HIGH (ref >=366)

## 2012-11-24 LAB — BASIC METABOLIC PANEL
Chloride: 105 mEq/L (ref 96–112)
GFR calc Af Amer: >90 mL/min (ref >90)
Potassium: 3.3 mEq/L — ABNORMAL LOW (ref 3.5–5.1)
Sodium: 137 mEq/L (ref 135–145)

## 2012-11-24 LAB — CBC
Platelets: 265 10*3/uL (ref 150–400)
RDW: 14.9 % (ref 11.5–15.5)
WBC: 4.7 10*3/uL (ref 4.0–10.5)

## 2012-11-24 MED ORDER — KCL IN DEXTROSE-NACL 20-5-0.45 MEQ/L-%-% IV SOLN
INTRAVENOUS | Status: DC
  Administered 2012-11-24 – 2012-11-25 (×2): via INTRAVENOUS
  Filled 2012-11-24 (×5): qty 1000

## 2012-11-24 MED ORDER — KCL IN DEXTROSE-NACL 40-5-0.45 MEQ/L-%-% IV SOLN: INTRAVENOUS | Status: DC

## 2012-11-24 NOTE — Discharge Summary (Signed)
Physician Discharge Summary  Jo Alvarez WUJ:[id number] DOB: 1924/12/31 DOA: 11/22/2012  PCP: Jo Covey, MD  Admit date: 11/22/2012 Discharge date: 11/25/2012  Recommendations for Outpatient Follow-up:  1. Follow up with primary care doctor within 1-2 weeks for CBC and BMP and blood pressure check.  Please start iron once bowels settle.   2. Follow up [facility] within 1 month  Discharge Diagnoses:  Principal Problem:   Rectal bleed Active Problems:   HYPERTENSION   TEMPORAL ARTERITIS   Ischemic colitis   HTN (hypertension)   Hyponatremia   Discharge Condition: stable, improved  Diet recommendation: healthy heart  Wt Readings from Last 3 Encounters:  11/23/12 53.615 kg (118 lb 3.2 oz)  10/22/12 61.1 kg (134 lb 11.2 oz)  03/02/12 55.339 kg (122 lb)    History of present illness:   Jo Alvarez is a 79 y.o. female who was recently admitted last month for ischemic colitis with possible sepsis was brought from the nursing home after patient was noticed to have increased bowel movement bleeding per rectum. Patient in the ER was found to be stool for occult blood positive.This time patient does not complain of any abdominal pain but the previous. Patient denies any nausea vomiting chest pain or shortness of breath. Patient has had at least 3-4 bowel movements in the nursing home and has not had any in the ER. Patient is hemodynamically stable and has been admitted for further management.    Hospital Course:   Rectal bleeding-painless. No bleeding, but continues to have watery stools. No pain.  -Recent admission 2/13-2/21 for ischemic colitis  -Hemoglobin remained stable around 9.5mg /dl -Hgb on 9/56/21 = 8.5  -Appreciate [facility] assistance  - Advanced to full diet on 3/26 which she tolerated well - Previous stool culture, O&P, c diff were negative.  - GI recommended continuing antibiotics for a total of 7 days.   Cardiac ectopy  -History of paroxysmal atrial  fibrillation, not on A/C due to GIB  -sinus on tele with PACs and nonconducted P-waves   Hypertension mildly elevated BP  -Continue amlodipine, clonidine - Added low dose beta blocker - Trend BP, may be mildly elevated due to acute illness.   V-tach:  Had a run of 11 beats of V-tach on telemetry.  Asympatomatic and VSS.  -  Electrolytes were within normal limits -  Started low dose beta blocker  Temporal arteritis  -Continued maintenance dose of prednisone 10 mg daily   Psoriatic arthritis  -Continued Arava   Hyponatremia, mild and sodium stable.  Pt asymptomatic -Has history of hyponatremia secondary to SIADH  - TSH wnl   Anemia of chronic disease  - iron studies suggestive of iron deficiency  - PCP to start iron as outpatient - B12 640, RBC folate 1587   Procedures:  None  Consultations:  GI, [facility]  Dr. Bosie Alvarez  Antibiotics: Ciprofloxacin 3/25 >> 3/31 Flagyl 3/25 >> 3/31   Discharge Exam: Filed Vitals:   11/25/12 0659  BP: 140/82  Pulse: 70  Temp: 98.1 F (36.7 C)  Resp: 18   Filed Vitals:   11/24/12 0445 11/24/12 1524 11/24/12 2125 11/25/12 0659  BP: 155/77 107/49 105/55 140/82  Pulse: 69 66 77 70  Temp: 97.8 F (36.6 C) 98.5 F (36.9 C) 98.6 F (37 C) 98.1 F (36.7 C)  TempSrc: Oral Oral Oral Oral  Resp: 18 16 16 18   Height:      Weight:      SpO2: 100% 97% 98% 97%  States she feels well.  She is normally constipated so having 2-3 watery BMs is tolerable.  Denies abdominal pain and ate well yesterday without pain or increased diarrhea or blood in stools  General: CF, NAD, able to answer questions appropriately, lying in bed  HEENT: No icterus, No thrush, [place]/AT  Cardiovascular: RRR, S1/S2, no mrg rubs, no PACs this AM.   Respiratory: CTA bilaterally, no wheezing, no crackles, no rhonchi  Abdomen: Soft/+BS, non tender, non distended, no guarding Extremities: No edema   Discharge Instructions      Discharge Orders   Future Orders  Complete By Expires     Call MD for:  difficulty breathing, headache or visual disturbances  As directed     Call MD for:  extreme fatigue  As directed     Call MD for:  hives  As directed     Call MD for:  persistant dizziness or light-headedness  As directed     Call MD for:  persistant nausea and vomiting  As directed     Call MD for:  severe uncontrolled pain  As directed     Call MD for:  temperature >100.4  As directed     Diet - low sodium heart healthy  As directed     Discharge instructions  As directed     Comments:      You were hospitalized with colitis, likely ischemic colitis, which rapidly improved.  Due to persistent diarrhea at time of discharge, please stop your laxatives and you may use imodium as needed for diarrhea.  If you become constipated, it is okay to restart use of your laxatives.  Please continue antibiotics by mouth for an additional 5 days, through 3/31, then stop.  Please follow up with your primary care doctor in 1-2 weeks to discuss blood pressure, bowel movements and anemia.  You may need to start iron, but because this is irritating to the stomach and bowels, I do not want to start this medication now.  You were started on low dose blood pressure medication called metoprolol due to a heart rhythm called nonsustained ventricular tachycardia that was seen on telemetry.  Please talk to your primary care doctor about this medication.  If you have persistent diarrhea or recurrent blood in stools, please call [facility], Dr. Marge Alvarez office, or if severe, return to the emergency department.    Increase activity slowly  As directed         Medication List    STOP taking these medications       docusate sodium 100 MG capsule  Commonly known as:  COLACE     senna 8.6 MG tablet  Commonly known as:  SENOKOT      TAKE these medications       amLODipine 10 MG tablet  Commonly known as:  NORVASC  Take 1 tablet (10 mg total) by mouth daily.     calcium-vitamin D  500-200 MG-UNIT per tablet  Take 1 tablet by mouth 2 (two) times daily with a meal.     ciprofloxacin 500 MG tablet  Commonly known as:  CIPRO  Take 1 tablet (500 mg total) by mouth 2 (two) times daily.     cloNIDine 0.1 MG tablet  Commonly known as:  CATAPRES  Take 1 tablet (0.1 mg total) by mouth daily.     diazepam 5 MG tablet  Commonly known as:  VALIUM  Take 1 tablet (5 mg total) by mouth every 12 (twelve) hours  as needed. anxiety     gabapentin 300 MG capsule  Commonly known as:  NEURONTIN  Take 1 capsule (300 mg total) by mouth 2 (two) times daily.     leflunomide 10 MG tablet  Commonly known as:  ARAVA  Take 15 mg by mouth daily.     loperamide 2 MG capsule  Commonly known as:  IMODIUM  Take 2 mg by mouth 4 (four) times daily as needed. For diarrhea     metoprolol succinate 12.5 mg Tb24  Commonly known as:  TOPROL-XL  Take 0.5 tablets (12.5 mg total) by mouth daily.     metroNIDAZOLE 500 MG tablet  Commonly known as:  FLAGYL  Take 1 tablet (500 mg total) by mouth 3 (three) times daily.     oxyCODONE-acetaminophen 5-325 MG per tablet  Commonly known as:  PERCOCET/ROXICET  Take 1 tablet by mouth every 6 (six) hours as needed for pain.     polyethylene glycol packet  Commonly known as:  MIRALAX / GLYCOLAX  Take 17 g by mouth daily.     predniSONE 10 MG tablet  Commonly known as:  DELTASONE  Take 1 tablet (10 mg total) by mouth daily.     PreserVision AREDS Caps  Take 2 capsules by mouth 2 (two) times daily.     multivitamins ther. w/minerals Tabs  Take 1 tablet by mouth daily.     promethazine 25 MG tablet  Commonly known as:  PHENERGAN  Take 25 mg by mouth every 6 (six) hours as needed for nausea.     ranitidine 150 MG tablet  Commonly known as:  ZANTAC  Take 1 tablet (150 mg total) by mouth 2 (two) times daily. For reflux.     traMADol 50 MG tablet  Commonly known as:  ULTRAM  Take 50 mg by mouth every 6 (six) hours as needed for pain.        Follow-up Information   Follow up with Jo Covey, MD. Schedule an appointment as soon as possible for a visit in 2 weeks.   Contact information:   [address] [place] [address] [phone]       Follow up with [facility]. Schedule an appointment as soon as possible for a visit in 1 month.   Contact information:   [address] [place] [address] [phone]      The results of significant diagnostics from this hospitalization (including imaging, microbiology, ancillary and laboratory) are listed below for reference.    Significant Diagnostic Studies: No results found.  Microbiology: Recent Results (from the past 240 hour(s))  MRSA PCR SCREENING     Status: None   Collection Time    11/23/12  2:10 AM      Result Value Range Status   MRSA by PCR NEGATIVE  NEGATIVE Final   Comment:            The GeneXpert MRSA Assay (FDA     approved for NASAL specimens     only), is one component of a     comprehensive MRSA colonization     surveillance program. It is not     intended to diagnose MRSA     infection nor to guide or     monitor treatment for     MRSA infections.  CLOSTRIDIUM DIFFICILE BY PCR     Status: None   Collection Time    11/23/12  9:20 AM      Result Value  Range Status   C difficile by pcr NEGATIVE  NEGATIVE Final     Labs: Basic Metabolic Panel:  Recent Labs Lab 11/22/12 2005 11/23/12 0920 11/24/12 0427 11/25/12 0430  NA 131*  --  137 134*  K 4.2  --  3.3* 3.6  CL 97  --  105 102  CO2 25  --  25 26  GLUCOSE 84  --  85 108*  BUN 17  --  9 10  CREATININE 0.57  --  0.53 0.60  CALCIUM 8.8  --  8.0* 8.1*  MG  --  2.0  --  1.9   Liver Function Tests:  Recent Labs Lab 11/22/12 2005  AST 20  ALT 13  ALKPHOS 90  BILITOT 0.2*  PROT 6.2  ALBUMIN 3.2*   No results found for this basename: LIPASE, AMYLASE,  in the last 168 hours No results found for this basename: AMMONIA,  in the last 168  hours CBC:  Recent Labs Lab 11/22/12 2005 11/23/12 0445 11/24/12 0427 11/25/12 0430  WBC 9.3 8.1 4.7 5.5  NEUTROABS 6.4 6.1  --   --   HGB 11.0* 9.5* 9.7* 9.5*  HCT 33.9* 28.9* 29.8* 28.3*  MCV 92.1 92.3 92.0 91.3  PLT 325 253 265 249   Cardiac Enzymes: No results found for this basename: CKTOTAL, CKMB, CKMBINDEX, TROPONINI,  in the last 168 hours BNP: BNP (last 3 results) No results found for this basename: PROBNP,  in the last 8760 hours CBG: No results found for this basename: GLUCAP,  in the last 168 hours  Time coordinating discharge: 45 minutes  Signed:  Travarus Trudo  [facility] 11/25/2012, 8:48 AM

## 2012-11-24 NOTE — Progress Notes (Signed)
Patient ID: Jo Alvarez, female   DOB: Nov 14, 1924, 79 y.o.   MRN: [id number] [facility] Progress Note  Jo Alvarez 79 y.o. 1925-01-05   Subjective: Feels ok. Denies rectal bleeding. Reports stools have some formed to them in addition to being loose. C. Diff negative. Tolerating full liquids.  Objective: Vital signs: Filed Vitals:   11/24/12 0445  BP: 155/77  Pulse: 69  Temp: 97.8 F (36.6 C)  Resp: 18    Physical Exam: Gen: alert, no acute distress  Abd: soft, NT, ND, +BS  Lab Results:  Recent Labs  11/22/12 2005 11/23/12 0920 11/24/12 0427  NA 131*  --  137  K 4.2  --  3.3*  CL 97  --  105  CO2 25  --  25  GLUCOSE 84  --  85  BUN 17  --  9  CREATININE 0.57  --  0.53  CALCIUM 8.8  --  8.0*  MG  --  2.0  --     Recent Labs  11/22/12 2005  AST 20  ALT 13  ALKPHOS 90  BILITOT 0.2*  PROT 6.2  ALBUMIN 3.2*    Recent Labs  11/22/12 2005 11/23/12 0445 11/24/12 0427  WBC 9.3 8.1 4.7  NEUTROABS 6.4 6.1  --   HGB 11.0* 9.5* 9.7*  HCT 33.9* 28.9* 29.8*  MCV 92.1 92.3 92.0  PLT 325 253 265      Assessment/Plan: 79yo with recent ischemic colitis. No evidence of ongoing bleeding and diarrhea improving. Would change abx to oral agents to complete 7 day course. Advance diet. Will sign off. F/U prn. Call if questions.   Jo Alvarez C. 11/24/2012, 1:07 PM

## 2012-11-24 NOTE — Progress Notes (Signed)
[facility] PROGRESS NOTE  Jo Alvarez WUJ:[id number] DOB: 30-Oct-1924 DOA: 11/22/2012 PCP: Kristian Covey, MD  Assessment/Plan: Rectal bleeding-painless.  No bleeding, but continues to have watery stools, although less frequent.  No pain. -Recent admission 2/13-2/21 for ischemic colitis -Hemoglobin is stable from yesterday -Hgb on 10/22/12 = 8.5 -Appreciate [facility] assistance - Advance diet to full liquid today -  Stool culture O&P from recent admission a few weeks ago negative.    Cardiac ectopy -History of paroxysmal atrial fibrillation, not on A/C due to GIB -sinus on tele with PACs and nonconducted P-waves  Hypertension mildly elevated BP -Continue amlodipine, clonidine - Trend BP, may be mildly elevated due to acute illness.  Temporal arteritis -Continue maintenance dose of prednisone 10 mg daily  Psoriatic arthritis -Continue Arava  Hyponatremia, sodium currently wnl -Has history of hyponatremia secondary to SIADH - TSH wnl  Anemia of chronic disease -iron studies suggestive of iron deficiency - Start iron when tolerating PO - B12 640, RBC folate pending   Family Communication:   Pt at beside Disposition Plan:   Home when medically stable    Subjective: Patient denies fevers, chills, chest pain, shortness breath, dizziness, nausea, vomiting, abdominal pain, dysuria, hematuria.  Still having watery BMs, but no blood since admission.    Objective: Filed Vitals:   11/23/12 1040 11/23/12 1300 11/23/12 2116 11/24/12 0445  BP: 123/72 127/68 144/59 155/77  Pulse: 86 87 75 69  Temp:  98.7 F (37.1 C) 98.6 F (37 C) 97.8 F (36.6 C)  TempSrc:  Oral Oral Oral  Resp:  18 18 18   Height:      Weight:      SpO2:  98% 97% 100%    Intake/Output Summary (Last 24 hours) at 11/24/12 1106 Last data filed at 11/24/12 0956  Gross per 24 hour  Intake   1255 ml  Output   1250 ml  Net      5 ml   Weight change:  Exam:   General:  CF, NAD, able to answer  questions appropriately  HEENT: No icterus, No thrush,  [place]/AT  Cardiovascular: RRR, S1/S2, no rubs, PACs  Respiratory: CTA bilaterally, no wheezing, no crackles, no rhonchi  Abdomen: Soft/+BS, non tender, non distended, no guarding Extremities: No edema, No lymphangitis, No petechiae  Data Reviewed: Basic Metabolic Panel:  Recent Labs Lab 11/22/12 2005 11/23/12 0920 11/24/12 0427  NA 131*  --  137  K 4.2  --  3.3*  CL 97  --  105  CO2 25  --  25  GLUCOSE 84  --  85  BUN 17  --  9  CREATININE 0.57  --  0.53  CALCIUM 8.8  --  8.0*  MG  --  2.0  --    Liver Function Tests:  Recent Labs Lab 11/22/12 2005  AST 20  ALT 13  ALKPHOS 90  BILITOT 0.2*  PROT 6.2  ALBUMIN 3.2*   No results found for this basename: LIPASE, AMYLASE,  in the last 168 hours No results found for this basename: AMMONIA,  in the last 168 hours CBC:  Recent Labs Lab 11/22/12 2005 11/23/12 0445 11/24/12 0427  WBC 9.3 8.1 4.7  NEUTROABS 6.4 6.1  --   HGB 11.0* 9.5* 9.7*  HCT 33.9* 28.9* 29.8*  MCV 92.1 92.3 92.0  PLT 325 253 265   Cardiac Enzymes: No results found for this basename: CKTOTAL, CKMB, CKMBINDEX, TROPONINI,  in the last 168 hours BNP: No components found with  this basename: POCBNP,  CBG: No results found for this basename: GLUCAP,  in the last 168 hours  Recent Results (from the past 240 hour(s))  MRSA PCR SCREENING     Status: None   Collection Time    11/23/12  2:10 AM      Result Value Range Status   MRSA by PCR NEGATIVE  NEGATIVE Final   Comment:            The GeneXpert MRSA Assay (FDA     approved for NASAL specimens     only), is one component of a     comprehensive MRSA colonization     surveillance program. It is not     intended to diagnose MRSA     infection nor to guide or     monitor treatment for     MRSA infections.  CLOSTRIDIUM DIFFICILE BY PCR     Status: None   Collection Time    11/23/12  9:20 AM      Result Value Range Status   C difficile  by pcr NEGATIVE  NEGATIVE Final     Scheduled Meds: . amLODipine  10 mg Oral Daily  . ciprofloxacin  400 mg Intravenous BID  . cloNIDine  0.1 mg Oral Daily  . famotidine  20 mg Oral BID  . gabapentin  300 mg Oral BID  . leflunomide  15 mg Oral Daily  . metronidazole  500 mg Intravenous Q8H  . pantoprazole (PROTONIX) IV  40 mg Intravenous Q24H  . predniSONE  10 mg Oral Daily  . sodium chloride  3 mL Intravenous Q12H   Continuous Infusions: . dextrose 5 % and 0.45 % NaCl with KCl 20 mEq/L 75 mL/hr at 11/24/12 0801     Renae Fickle, MD  [facility] Pager [phone]  If 7PM-7AM, please contact night-coverage [email or web address] Password [email or web address] 11/24/2012, 11:06 AM   LOS: 2 days

## 2012-11-25 LAB — BASIC METABOLIC PANEL
BUN: 10 mg/dL (ref 6–23)
Chloride: 102 mEq/L (ref 96–112)
GFR calc Af Amer: >90 mL/min (ref >90)
Glucose, Bld: 108 mg/dL — ABNORMAL HIGH (ref 70–99)
Potassium: 3.6 mEq/L (ref 3.5–5.1)

## 2012-11-25 LAB — CBC
HCT: 28.3 % — ABNORMAL LOW (ref 36.0–46.0)
Hemoglobin: 9.5 g/dL — ABNORMAL LOW (ref 12.0–15.0)
WBC: 5.5 10*3/uL (ref 4.0–10.5)

## 2012-11-25 MED ORDER — CIPROFLOXACIN HCL 500 MG PO TABS: 500.0000 mg | ORAL_TABLET | Freq: Two times a day (BID) | ORAL | Status: DC

## 2012-11-25 MED ORDER — METOPROLOL SUCCINATE 12.5 MG HALF TABLET: 12.5000 mg | ORAL_TABLET | Freq: Every day | ORAL | Status: AC

## 2012-11-25 MED ORDER — METRONIDAZOLE 500 MG PO TABS: 500.0000 mg | ORAL_TABLET | Freq: Three times a day (TID) | ORAL | Status: DC

## 2012-11-25 NOTE — Progress Notes (Signed)
[facility] PROGRESS NOTE  Jo Alvarez OZH:[id number] DOB: [birth year] DOA: 11/22/2012 PCP: Kristian Covey, MD  Assessment/Plan: Rectal bleeding-painless. No bleeding, but continues to have watery stools. No pain.  -Recent admission 2/13-2/21 for ischemic colitis  -Hemoglobin remained stable around 9.5mg /dl  -Hgb on 02/28/51 = 8.5  -Appreciate [facility] assistance  - Advanced to full diet on 3/26 which she tolerated well  - Previous stool culture, O&P, c diff were negative.  - GI recommended continuing antibiotics for a total of 7 days.   Cardiac ectopy  -History of paroxysmal atrial fibrillation, not on A/C due to GIB  -sinus on tele with PACs and nonconducted P-waves   Hypertension mildly elevated BP  -Continue amlodipine, clonidine  - Added low dose beta blocker  - Trend BP, may be mildly elevated due to acute illness.   V-tach: Had a run of 11 beats of V-tach on telemetry. Asympatomatic and VSS.  - Electrolytes were within normal limits  - Started low dose beta blocker   Temporal arteritis  -Continued maintenance dose of prednisone 10 mg daily   Psoriatic arthritis  -Continued Arava   Hyponatremia, mild and sodium stable. Pt asymptomatic  -Has history of hyponatremia secondary to SIADH  - TSH wnl   Anemia of chronic disease  - iron studies suggestive of iron deficiency  - PCP to start iron as outpatient  - B12 640, RBC folate 1587    Family Communication:   Pt at beside Disposition Plan:   Awaiting evaluation by ALF.  Discharge to ALF vs. SNF tomorrow    Subjective: States she feels well. She is normally constipated so having 2-3 watery BMs is tolerable. Denies abdominal pain and ate well yesterday without pain or increased diarrhea or blood in stools   Objective: Filed Vitals:   11/24/12 0445 11/24/12 1524 11/24/12 2125 11/25/12 0659  BP: 155/77 107/49 105/55 140/82  Pulse: 69 66 77 70  Temp: 97.8 F (36.6 C) 98.5 F (36.9 C) 98.6 F (37 C)  98.1 F (36.7 C)  TempSrc: Oral Oral Oral Oral  Resp: 18 16 16 18   Height:      Weight:      SpO2: 100% 97% 98% 97%    Intake/Output Summary (Last 24 hours) at 11/25/12 1523 Last data filed at 11/25/12 1428  Gross per 24 hour  Intake 2823.75 ml  Output   1550 ml  Net 1273.75 ml   Weight change:  Exam:  General: CF, NAD, able to answer questions appropriately, lying in bed  HEENT: No icterus, No thrush, [place]/AT  Cardiovascular: RRR, S1/S2, no mrg rubs, no PACs this AM.  Respiratory: CTA bilaterally, no wheezing, no crackles, no rhonchi  Abdomen: Soft/+BS, non tender, non distended, no guarding  Extremities: No edema   Data Reviewed: Basic Metabolic Panel:  Recent Labs Lab 11/22/12 2005 11/23/12 0920 11/24/12 0427 11/25/12 0430  NA 131*  --  137 134*  K 4.2  --  3.3* 3.6  CL 97  --  105 102  CO2 25  --  25 26  GLUCOSE 84  --  85 108*  BUN 17  --  9 10  CREATININE 0.57  --  0.53 0.60  CALCIUM 8.8  --  8.0* 8.1*  MG  --  2.0  --  1.9   Liver Function Tests:  Recent Labs Lab 11/22/12 2005  AST 20  ALT 13  ALKPHOS 90  BILITOT 0.2*  PROT 6.2  ALBUMIN 3.2*   No results found  for this basename: LIPASE, AMYLASE,  in the last 168 hours No results found for this basename: AMMONIA,  in the last 168 hours CBC:  Recent Labs Lab 11/22/12 2005 11/23/12 0445 11/24/12 0427 11/25/12 0430  WBC 9.3 8.1 4.7 5.5  NEUTROABS 6.4 6.1  --   --   HGB 11.0* 9.5* 9.7* 9.5*  HCT 33.9* 28.9* 29.8* 28.3*  MCV 92.1 92.3 92.0 91.3  PLT 325 253 265 249   Cardiac Enzymes: No results found for this basename: CKTOTAL, CKMB, CKMBINDEX, TROPONINI,  in the last 168 hours BNP: No components found with this basename: POCBNP,  CBG: No results found for this basename: GLUCAP,  in the last 168 hours  Recent Results (from the past 240 hour(s))  MRSA PCR SCREENING     Status: None   Collection Time    11/23/12  2:10 AM      Result Value Range Status   MRSA by PCR NEGATIVE  NEGATIVE  Final   Comment:            The GeneXpert MRSA Assay (FDA     approved for NASAL specimens     only), is one component of a     comprehensive MRSA colonization     surveillance program. It is not     intended to diagnose MRSA     infection nor to guide or     monitor treatment for     MRSA infections.  CLOSTRIDIUM DIFFICILE BY PCR     Status: None   Collection Time    11/23/12  9:20 AM      Result Value Range Status   C difficile by pcr NEGATIVE  NEGATIVE Final     Scheduled Meds: . amLODipine  10 mg Oral Daily  . ciprofloxacin  400 mg Intravenous BID  . cloNIDine  0.1 mg Oral Daily  . famotidine  20 mg Oral BID  . gabapentin  300 mg Oral BID  . leflunomide  15 mg Oral Daily  . metronidazole  500 mg Intravenous Q8H  . pantoprazole (PROTONIX) IV  40 mg Intravenous Q24H  . predniSONE  10 mg Oral Daily  . sodium chloride  3 mL Intravenous Q12H   Continuous Infusions: . dextrose 5 % and 0.45 % NaCl with KCl 20 mEq/L 75 mL/hr at 11/25/12 0209     Renae Fickle, MD  [facility] Pager [phone]  If 7PM-7AM, please contact night-coverage [email or web address] Password [facility] 11/25/2012, 3:23 PM   LOS: 3 days

## 2012-11-25 NOTE — Evaluation (Signed)
Physical Therapy Evaluation Patient Details Name: Jo Alvarez MRN: [id number] DOB: 06-15-1925 Today's Date: 11/25/2012 Time: 1308-6578 PT Time Calculation (min): 19 min  PT Assessment / Plan / Recommendation Clinical Impression  Pt admitted for rectal bleeding from [facility].  Pt would benefit from acute PT services in order to improve independence with transfers and w/c mobility to prepare for d/c back to ALF.      PT Assessment  Patient needs continued PT services    Follow Up Recommendations  Home health PT;Supervision/Assistance - 24 hour    Does the patient have the potential to tolerate intense rehabilitation      Barriers to Discharge        Equipment Recommendations  None recommended by PT    Recommendations for Other Services     Frequency Min 3X/week    Precautions / Restrictions Precautions Precautions: Fall Precaution Comments: sliding w/c transfers, bil foot drop with AFOs at home   Pertinent Vitals/Pain n/a      Mobility  Bed Mobility Bed Mobility: Supine to Sit Supine to Sit: 5: Supervision;HOB elevated Details for Bed Mobility Assistance: increased time however moves well Transfers Transfers: Squat Pivot Transfers Squat Pivot Transfers: 1: +1 Total assist Details for Transfer Assistance: unable to stand with assist so performed squat pivot bed to recliner, pt reports she usually scoots over to w/c however no drop arm equipment in room Ambulation/Gait Ambulation/Gait Assistance: Not tested (comment)    Exercises     PT Diagnosis: Generalized weakness  PT Problem List: Decreased strength;Decreased activity tolerance;Decreased mobility PT Treatment Interventions: DME instruction;Functional mobility training;Therapeutic activities;Therapeutic exercise;Wheelchair mobility training;Patient/family education   PT Goals Acute Rehab PT Goals PT Goal Formulation: With patient Time For Goal Achievement: 12/02/12 Potential to Achieve Goals:  Good Pt will go Supine/Side to Sit: with modified independence;with HOB 0 degrees PT Goal: Supine/Side to Sit - Progress: Goal set today Pt will Transfer Bed to Chair/Chair to Bed: with supervision PT Transfer Goal: Bed to Chair/Chair to Bed - Progress: Goal set today Pt will Propel Wheelchair: > 150 feet;with modified independence PT Goal: Propel Wheelchair - Progress: Goal set today  Visit Information  Last PT Received On: 11/25/12 Assistance Needed: +1    Subjective Data  Subjective: I haven't walked in a year.  In therapy a while ago, I could take 6 steps though.   Prior Functioning  Home Living Lives With: Other (Comment) Available Help at Discharge: Other (Comment) ([facility]) Type of Home: Assisted living Home Adaptive Equipment: Wheelchair - manual Additional Comments: Pt reports that she's "Not safe to walk, I use a w/c for about a year now, I don't walk" Prior Function Level of Independence: Needs assistance Needs Assistance: Bathing;Toileting;Meal Prep;Light Housekeeping Comments: Pt reports she transfers to w/c for mobility with supervision, no physical assist per pt Communication Communication: No difficulties    Cognition  Cognition Overall Cognitive Status: Appears within functional limits for tasks assessed/performed Arousal/Alertness: Awake/alert Orientation Level: Appears intact for tasks assessed Behavior During Session: [facility] for tasks performed    Extremity/Trunk Assessment Right Lower Extremity Assessment RLE ROM/Strength/Tone: Deficits RLE ROM/Strength/Tone Deficits: unable to support weight standing, bil foot drop RLE Sensation: History of peripheral neuropathy Left Lower Extremity Assessment LLE ROM/Strength/Tone: Deficits LLE ROM/Strength/Tone Deficits: unable to support weight standing, bil foot drop LLE Sensation: History of peripheral neuropathy   Balance    End of Session PT - End of Session Equipment Utilized During Treatment: Gait  belt Activity Tolerance: Patient tolerated treatment  well Patient left: in chair;with call bell/phone within reach Nurse Communication: Need for lift equipment  GP     Lovette Merta,KATHrine E 11/25/2012, 10:40 AM Zenovia Jarred, PT, DPT 11/25/2012 Pager: [phone]

## 2012-11-25 NOTE — Progress Notes (Signed)
CSW spoke with Ebony @ [facility] who states that [employer] their administrator are in a meeting off campus today and unable to assess for return today. CSW text paged Dr. Malachi Bonds to make her aware. Will follow-up first thing in the morning. Patient & son aware as well. CSW faxed information over for them to review before they come out.   Unice Bailey, LCSW [facility] Clinical Social Worker cell #: [phone]

## 2012-11-25 NOTE — Progress Notes (Signed)
Patient had a short run of 11 beats of ventricular tachycardia.  Vitals were 98.76F,70,18,140/82,97 RA. Patient was asymptomatic. PCP was notified. New orders.

## 2012-11-25 NOTE — Evaluation (Signed)
Occupational Therapy Evaluation Patient Details Name: Jo Alvarez MRN: [id number] DOB: 06-May-1925 Today's Date: 11/25/2012 Time: 4098-1191 OT Time Calculation (min): 28 min  OT Assessment / Plan / Recommendation Clinical Impression  Pt is 79y/o female admitted w/ rectal bleed from ALF whom presents with generalized weakness and fatigue. She will benefit from OT, drop arm commode and w/c for transfer training to assist with increased independence w/ ADL, self care and transfers. As well as A/E for self feeding    OT Assessment  Patient needs continued OT Services    Follow Up Recommendations  Home health OT    Barriers to Discharge      Equipment Recommendations  Other (comment) (A/E for self feeding devices, Drop arm commode)    Recommendations for Other Services    Frequency  Min 2X/week    Precautions / Restrictions Precautions Precautions: Fall Precaution Comments: sliding w/c transfers, bil foot drop with AFOs at home Restrictions Other Position/Activity Restrictions: Pt reports that she has used w/c for approx 1 yr now, does not ambulate.   Pertinent Vitals/Pain No pain    ADL  Eating/Feeding: Simulated;Set up;Minimal assistance Where Assessed - Eating/Feeding: Bed level Grooming: Performed;Wash/dry hands;Wash/dry face;Minimal assistance;Other (comment) (Secondary neuropathy hands) Where Assessed - Grooming: Supported sitting Upper Body Bathing: Performed;Set up;Minimal assistance Where Assessed - Upper Body Bathing: Supported sitting Lower Body Bathing: Performed;Minimal assistance;Moderate assistance Where Assessed - Lower Body Bathing: Supported sitting Upper Body Dressing: Performed;Minimal assistance Where Assessed - Upper Body Dressing: Unsupported sitting Lower Body Dressing: Performed;Minimal assistance Where Assessed - Lower Body Dressing: Unsupported sitting Toilet Transfer: Simulated;Moderate assistance;Maximal assistance Toilet Transfer Method: Stand  pivot;Transfer board;Other (comment) (will need drop arm commode & w/c) Toilet Transfer Equipment: Drop arm bedside commode Toileting - Clothing Manipulation and Hygiene: Simulated;Moderate assistance;Maximal assistance Where Assessed - Toileting Clothing Manipulation and Hygiene: Lean right and/or left Tub/Shower Transfer Method: Not assessed Equipment Used: Gait belt;Other (comment) (will need drop arm commode & w/c) Transfers/Ambulation Related to ADLs: Pt was +1 total assist for SPT from EOB to chair/recliner. Pt reports that she performs sliding transfers to/from w/c, 3:1 at home. ADL Comments: Pt is 79y/o female admitted w/ rectal bleed from ALF whom presents with generalized weakness and fatigue. She will benefit from OT, drop arm commode and w/c for transfer training to assist with increased independence w/ ADL, self care and transfers.    OT Diagnosis: Generalized weakness  OT Problem List: Decreased strength;Decreased activity tolerance;Decreased coordination;Decreased knowledge of use of DME or AE;Impaired sensation OT Treatment Interventions: Self-care/ADL training;Energy conservation;DME and/or AE instruction;Therapeutic activities;Patient/family education;Other (comment) (ADaptive devices for self feeding)   OT Goals Acute Rehab OT Goals OT Goal Formulation: With patient Potential to Achieve Goals: Good ADL Goals Pt Will Perform Eating: with set-up;Sitting, chair;Supported;with adaptive utensils ADL Goal: Eating - Progress: Goal set today Pt Will Perform Grooming: Sitting, chair;Sitting at sink;with set-up;with adaptive equipment ADL Goal: Grooming - Progress: Goal set today Pt Will Transfer to Toilet: with min assist;Drop arm 3-in-1;with transfer board;with DME;Other (comment) (From w/c level) ADL Goal: Toilet Transfer - Progress: Goal set today Pt Will Perform Toileting - Clothing Manipulation: with min assist;Sitting on 3-in-1 or toilet ADL Goal: Toileting - Clothing  Manipulation - Progress: Goal set today Pt Will Perform Toileting - Hygiene: with supervision;with min assist;Leaning right and/or left on 3-in-1/toilet ADL Goal: Toileting - Hygiene - Progress: Goal set today Additional ADL Goal #1: Pt will be Mod I self feeding using adaptive utensils & set up  ADL Goal: Additional  Goal #1 - Progress: Goal set today  Visit Information  Last OT Received On: 11/25/12 Assistance Needed: +1    Subjective Data  Subjective: "I was only home a few days at the most" Patient Stated Goal: Return to [facility]   Prior Functioning     Home Living Lives With: Other (Comment) Available Help at Discharge: Other (Comment) ([facility]) Type of Home: Assisted living Home Adaptive Equipment: Wheelchair - manual Additional Comments: Pt reports that she's "Not safe to walk, I use a w/c for about a year now, I don't walk" Prior Function Level of Independence: Needs assistance Needs Assistance: Bathing;Toileting;Meal Prep;Light Housekeeping Comments: Pt reports she transfers to w/c for mobility with supervision, no physical assist per pt Communication Communication: No difficulties Dominant Hand: Right (Neuropathy R hand, inability AROM)    Vision/Perception Vision - History Patient Visual Report: No change from baseline   Cognition  Cognition Overall Cognitive Status: Appears within functional limits for tasks assessed/performed Arousal/Alertness: Awake/alert Orientation Level: Appears intact for tasks assessed Behavior During Session: [facility] for tasks performed    Extremity/Trunk Assessment Right Upper Extremity Assessment RUE ROM/Strength/Tone: Deficits RUE ROM/Strength/Tone Deficits: Pt w/ h/o neuropathy limiting AROM shoulder, elbow, wrist and hand. RUE Sensation: Deficits RUE Sensation Deficits: Neuropathy Left Upper Extremity Assessment LUE ROM/Strength/Tone: Deficits LUE ROM/Strength/Tone Deficits: H/O neuropathy (R>L) LUE  Sensation: Deficits LUE Sensation Deficits: Neuropathy Right Lower Extremity Assessment RLE ROM/Strength/Tone: Deficits RLE ROM/Strength/Tone Deficits: unable to support weight standing, bil foot drop RLE Sensation: History of peripheral neuropathy Left Lower Extremity Assessment LLE ROM/Strength/Tone: Deficits LLE ROM/Strength/Tone Deficits: unable to support weight standing, bil foot drop LLE Sensation: History of peripheral neuropathy     Mobility Bed Mobility Bed Mobility: Supine to Sit Supine to Sit: 5: Supervision;HOB elevated Details for Bed Mobility Assistance: increased time however moves well Transfers Details for Transfer Assistance: unable to stand with assist so performed squat pivot bed to recliner, pt reports she usually scoots over to w/c however no drop arm equipment in room     Exercise     Balance     End of Session OT - End of Session Equipment Utilized During Treatment: Gait belt Activity Tolerance: Patient tolerated treatment well Patient left: in chair;with call bell/phone within reach Nurse Communication: Mobility status;Need for lift equipment  GO     Alm Bustard 11/25/2012, 11:02 AM

## 2012-11-25 NOTE — Clinical Social Work Psychosocial (Signed)
Clinical Social Work Department BRIEF PSYCHOSOCIAL ASSESSMENT 11/25/2012  Patient:  Jo Alvarez, Jo Alvarez     Account Number:  [id number]     Admit date:  11/22/2012  Clinical Social Worker:  Orpah Greek  Date/Time:  11/25/2012 11:13 AM  Referred by:  Physician  Date Referred:  11/25/2012 Referred for  SNF Placement   Other Referral:   Interview type:  Family Other interview type:   patient's son, Jo Alvarez    PSYCHOSOCIAL DATA Living Status:  FACILITY Admitted from facility:  [facility] Level of care:  Assisted Living Primary support name:  Jo Alvarez (son) c#: [phone] Primary support relationship to patient:  CHILD, ADULT Degree of support available:   good    CURRENT CONCERNS Current Concerns  Post-Acute Placement   Other Concerns:    SOCIAL WORK ASSESSMENT / PLAN CSW spoke with patient's son, Jo Alvarez re: discharge planning. Patient was admitted from [facility] where he plans to have her return at discharge.   Assessment/plan status:  Information/Referral to [facility] Other assessment/ plan:   Information/referral to community resources:   CSW completed FL2 and faxed information to [facility], awaiting call back from [facility] @ ALF whether patient can return at discharge.    PATIENT'S/FAMILY'S RESPONSE TO PLAN OF CARE: Son reports that pt just left [facility] on Friday, 3/21 and they stated that she had peaked with her therapy and they discharged her to [facility]. Pt's son states that he would prefer for pt to return to and receive therapy at [facility] if they are able to do so and if it is appropriate.        Jo Bailey, LCSW [facility] Clinical Social Worker cell #: [phone]

## 2012-11-26 DIAGNOSIS — R109 Unspecified abdominal pain: Secondary | ICD-10-CM

## 2012-11-26 DIAGNOSIS — R52 Pain, unspecified: Secondary | ICD-10-CM

## 2012-11-26 LAB — CBC
MCH: 30.1 pg (ref 26.0–34.0)
MCHC: 33.1 g/dL (ref 30.0–36.0)
RDW: 14.5 % (ref 11.5–15.5)

## 2012-11-26 LAB — BASIC METABOLIC PANEL
BUN: 15 mg/dL (ref 6–23)
Calcium: 8.2 mg/dL — ABNORMAL LOW (ref 8.4–10.5)
GFR calc Af Amer: 73 mL/min — ABNORMAL LOW (ref >90)
GFR calc non Af Amer: 63 mL/min — ABNORMAL LOW (ref >90)
Potassium: 3.6 mEq/L (ref 3.5–5.1)
Sodium: 133 mEq/L — ABNORMAL LOW (ref 135–145)

## 2012-11-26 MED ORDER — PANTOPRAZOLE SODIUM 40 MG PO TBEC
40.0000 mg | DELAYED_RELEASE_TABLET | Freq: Every day | ORAL | Status: DC
  Administered 2012-11-26: 40 mg via ORAL
  Filled 2012-11-26: qty 1

## 2012-11-26 NOTE — Care Management Note (Signed)
    Page 1 of 1   11/26/2012     3:54:18 PM   CARE MANAGEMENT NOTE 11/26/2012  Patient:  Jo Alvarez, Jo Alvarez   Account Number:  [id number]  Date Initiated:  11/23/2012  Documentation initiated by:  Jiles Crocker  Subjective/Objective Assessment:   ADMITTED WITH RECTAL BLEED     Action/Plan:   PCP: Kristian Covey, MD  PATIENT RESIDES IN AN ASSISTED LIVING FACILITY; SOC WORKER REFERRAL PLACED   Anticipated DC Date:  11/27/2012   Anticipated DC Plan:  ASSISTED LIVING / REST HOME  In-house referral  Clinical Social Worker      DC Planning Services  CM consult      Choice offered to / List presented to:             Status of service:  Completed, signed off Medicare Important Message given?  NA - LOS <3 / Initial given by admissions (If response is "NO", the following Medicare IM given date fields will be blank) Date Medicare IM given:   Date Additional Medicare IM given:    Discharge Disposition:  ASSISTED LIVING  Per UR Regulation:  Reviewed for med. necessity/level of care/duration of stay  If discussed at Long Length of Stay Meetings, dates discussed:    Comments:  11/26/2012 Colleen Can BSN RN CCM [phone] Pt returned to [facility]. Per [facility] home services are provided for patient in house.    11/23/2012- B CHANDLER RN,BSN,MHA

## 2012-11-26 NOTE — Progress Notes (Signed)
Patient is set to return to [facility] today. Shawna from ALF came out to assess for readmission today and she cleared her for return. CSW left message for patient's son [name] (ph#: [phone]). [facility] called for transport. Discharge packet in [place].   Unice Bailey, LCSW [facility] Clinical Social Worker cell #: [phone]

## 2012-11-26 NOTE — Discharge Summary (Signed)
Physician Discharge Summary  Jo Alvarez ZOX:[id number] DOB: [birth year] DOA: 11/22/2012  PCP: Kristian Covey, MD  Admit date: 11/22/2012 Discharge date: 11/26/2012  Recommendations for Outpatient Follow-up:  1. Follow up with primary care doctor within 1-2 weeks for CBC and BMP and blood pressure check.  Please start iron.   2. Follow up [facility] within 1 month  Discharge Diagnoses:  Principal Problem:   Rectal bleed Active Problems:   HYPERTENSION   TEMPORAL ARTERITIS   Ischemic colitis   HTN (hypertension)   Hyponatremia   Discharge Condition: stable, improved  Diet recommendation: healthy heart  Wt Readings from Last 3 Encounters:  11/23/12 53.615 kg (118 lb 3.2 oz)  10/22/12 61.1 kg (134 lb 11.2 oz)  03/02/12 55.339 kg (122 lb)    History of present illness:   Jo Alvarez is a 79 y.o. female who was recently admitted last month for ischemic colitis with possible sepsis was brought from the nursing home after patient was noticed to have increased bowel movement bleeding per rectum. Patient in the ER was found to be stool for occult blood positive.This time patient does not complain of any abdominal pain but the previous. Patient denies any nausea vomiting chest pain or shortness of breath. Patient has had at least 3-4 bowel movements in the nursing home and has not had any in the ER. Patient is hemodynamically stable and has been admitted for further management.    Hospital Course:   Rectal bleeding-painless.  Ms. Baskins was admitted with diarrhea and bloody stools suggestive of recurrent ischemic colitis.  She was seen by GI who recommended antibiotics for 7 days and slow advancement of diet as tolerated.  She has had a stable hemoglobin and was able to advance to a regular diet without increasing pain.  Her bloody stools have resolved and she has had decreased diarrhea.  Last BM was approximately 24 hours ago.  Previous stool culture, O&P, c diff were negative.   Recommend that she eat a high fiber diet and use miralax to titrate to having 2-3 soft BMs per day to avoid constipation which may aggravate her colitis.    Cardiac ectopy History of paroxysmal atrial fibrillation, not on A/C due to GIB.  She maintained sinus rhythm on telemetry with PACs and nonconducted P-waves.  She also had a Gabrelle Roca run of 11 beats of V-tach.  She was started on low dose beta blocker in addition to her other antihypertensives.  Her electrolytes and vital signs were stable.    Hypertension mildly elevated BP during admission.  She continued amlodipine, clonidine, and I added low dose metoprolol.    Temporal arteritis, remained stable.  Continued maintenance dose of prednisone 10 mg daily   Psoriatic arthritis, remained stable.  Continued Arava   Hyponatremia, mild and asymptomatic.   She has history of hyponatremia secondary to SIADH.  Her TSH was wnl.    Anemia of chronic disease.  Iron studies suggested of iron deficiency and her primary care doctor can start iron as an outpatient.  B12 640, RBC folate 1587  Lab Results  Component Value Date   IRON 31* 11/23/2012   TIBC 259 11/23/2012   FERRITIN 51 11/23/2012    Procedures:  None  Consultations:  [facility], [facility]  Dr. Bosie Clos  Antibiotics: Ciprofloxacin 3/25 >> 3/31 Flagyl 3/25 >> 3/31   Discharge Exam: Filed Vitals:   11/26/12 0529  BP: 153/56  Pulse:   Temp:   Resp:    Filed Vitals:   11/25/12  2952 11/25/12 2303 11/26/12 0454 11/26/12 0529  BP: 140/82 154/65 171/68 153/56  Pulse: 70 69 71   Temp: 98.1 F (36.7 C) 98.9 F (37.2 C) 98.5 F (36.9 C)   TempSrc: Oral Oral Oral   Resp: 18 16 24    Height:      Weight:      SpO2: 97% 96% 97%    States she feels well.  Last BM was 24 hours ago and was watery.  Denies abdominal pain and ate well yesterday without pain or blood in stools  General: CF, NAD, able to answer questions appropriately, lying in bed HEENT: MMM, [place]/AT  Cardiovascular: RRR, S1/S2,  no mrg rubs, no PACs this AM.   Respiratory: CTA bilaterally, no wheezing, no crackles, no rhonchi  Abdomen: Soft/+BS, non tender, non distended, no guarding Extremities: No edema   Discharge Instructions      Discharge Orders   Future Orders Complete By Expires     Call MD for:  difficulty breathing, headache or visual disturbances  As directed     Call MD for:  extreme fatigue  As directed     Call MD for:  hives  As directed     Call MD for:  persistant dizziness or light-headedness  As directed     Call MD for:  persistant nausea and vomiting  As directed     Call MD for:  severe uncontrolled pain  As directed     Call MD for:  temperature >100.4  As directed     Diet - low sodium heart healthy  As directed     Discharge instructions  As directed     Comments:      You were hospitalized with colitis, likely ischemic colitis, which rapidly improved.  Please resume your daily miralax and adjust dose so that you have 2-3 soft bowel movements daily.    Please continue antibiotics through 3/31, then stop.  Please follow up with your primary care doctor in 1-2 weeks to discuss blood pressure, bowel movements and anemia.  You may need to start iron, but because this is irritating to the stomach and bowels, I do not want to start this medication now.  You were started on low dose blood pressure medication called metoprolol due to a heart rhythm called nonsustained ventricular tachycardia that was seen on telemetry.  Please talk to your primary care doctor about this medication.  If you have persistent diarrhea or recurrent blood in stools, please call [facility], Dr. Marge Duncans office, or if severe, return to the emergency department.    Increase activity slowly  As directed         Medication List    STOP taking these medications       docusate sodium 100 MG capsule  Commonly known as:  COLACE     senna 8.6 MG tablet  Commonly known as:  SENOKOT      TAKE these medications        amLODipine 10 MG tablet  Commonly known as:  NORVASC  Take 1 tablet (10 mg total) by mouth daily.     calcium-vitamin D 500-200 MG-UNIT per tablet  Take 1 tablet by mouth 2 (two) times daily with a meal.     ciprofloxacin 500 MG tablet  Commonly known as:  CIPRO  Take 1 tablet (500 mg total) by mouth 2 (two) times daily.     cloNIDine 0.1 MG tablet  Commonly known as:  CATAPRES  Take 1  tablet (0.1 mg total) by mouth daily.     diazepam 5 MG tablet  Commonly known as:  VALIUM  Take 1 tablet (5 mg total) by mouth every 12 (twelve) hours as needed. anxiety     gabapentin 300 MG capsule  Commonly known as:  NEURONTIN  Take 1 capsule (300 mg total) by mouth 2 (two) times daily.     leflunomide 10 MG tablet  Commonly known as:  ARAVA  Take 15 mg by mouth daily.     loperamide 2 MG capsule  Commonly known as:  IMODIUM  Take 2 mg by mouth 4 (four) times daily as needed. For diarrhea     metoprolol succinate 12.5 mg Tb24  Commonly known as:  TOPROL-XL  Take 0.5 tablets (12.5 mg total) by mouth daily.     metroNIDAZOLE 500 MG tablet  Commonly known as:  FLAGYL  Take 1 tablet (500 mg total) by mouth 3 (three) times daily.     oxyCODONE-acetaminophen 5-325 MG per tablet  Commonly known as:  PERCOCET/ROXICET  Take 1 tablet by mouth every 6 (six) hours as needed for pain.     polyethylene glycol packet  Commonly known as:  MIRALAX / GLYCOLAX  Take 17 g by mouth daily.     predniSONE 10 MG tablet  Commonly known as:  DELTASONE  Take 1 tablet (10 mg total) by mouth daily.     PreserVision AREDS Caps  Take 2 capsules by mouth 2 (two) times daily.     multivitamins ther. w/minerals Tabs  Take 1 tablet by mouth daily.     promethazine 25 MG tablet  Commonly known as:  PHENERGAN  Take 25 mg by mouth every 6 (six) hours as needed for nausea.     ranitidine 150 MG tablet  Commonly known as:  ZANTAC  Take 1 tablet (150 mg total) by mouth 2 (two) times daily. For reflux.      traMADol 50 MG tablet  Commonly known as:  ULTRAM  Take 50 mg by mouth every 6 (six) hours as needed for pain.       Follow-up Information   Follow up with Kristian Covey, MD. Schedule an appointment as soon as possible for a visit in 2 weeks.   Contact information:   [address] [place] [address] [phone]       Follow up with [facility]. Schedule an appointment as soon as possible for a visit in 1 month.   Contact information:   [address] [place] [address] [phone]      The results of significant diagnostics from this hospitalization (including imaging, microbiology, ancillary and laboratory) are listed below for reference.    Significant Diagnostic Studies: No results found.  Microbiology: Recent Results (from the past 240 hour(s))  MRSA PCR SCREENING     Status: None   Collection Time    11/23/12  2:10 AM      Result Value Range Status   MRSA by PCR NEGATIVE  NEGATIVE Final   Comment:            The GeneXpert MRSA Assay (FDA     approved for NASAL specimens     only), is one component of a     comprehensive MRSA colonization     surveillance program. It is not     intended to diagnose MRSA     infection nor to guide or     monitor treatment for  MRSA infections.  CLOSTRIDIUM DIFFICILE BY PCR     Status: None   Collection Time    11/23/12  9:20 AM      Result Value Range Status   C difficile by pcr NEGATIVE  NEGATIVE Final     Labs: Basic Metabolic Panel:  Recent Labs Lab 11/22/12 2005 11/23/12 0920 11/24/12 0427 11/25/12 0430 11/26/12 0540  NA 131*  --  137 134* 133*  K 4.2  --  3.3* 3.6 3.6  CL 97  --  105 102 100  CO2 25  --  25 26 23   GLUCOSE 84  --  85 108* 94  BUN 17  --  9 10 15   CREATININE 0.57  --  0.53 0.60 0.82  CALCIUM 8.8  --  8.0* 8.1* 8.2*  MG  --  2.0  --  1.9  --    Liver Function Tests:  Recent Labs Lab 11/22/12 2005  AST 20  ALT 13  ALKPHOS 90  BILITOT 0.2*   PROT 6.2  ALBUMIN 3.2*   No results found for this basename: LIPASE, AMYLASE,  in the last 168 hours No results found for this basename: AMMONIA,  in the last 168 hours CBC:  Recent Labs Lab 11/22/12 2005 11/23/12 0445 11/24/12 0427 11/25/12 0430 11/26/12 0540  WBC 9.3 8.1 4.7 5.5 6.5  NEUTROABS 6.4 6.1  --   --   --   HGB 11.0* 9.5* 9.7* 9.5* 9.7*  HCT 33.9* 28.9* 29.8* 28.3* 29.3*  MCV 92.1 92.3 92.0 91.3 91.0  PLT 325 253 265 249 253   Cardiac Enzymes: No results found for this basename: CKTOTAL, CKMB, CKMBINDEX, TROPONINI,  in the last 168 hours BNP: BNP (last 3 results) No results found for this basename: PROBNP,  in the last 8760 hours CBG: No results found for this basename: GLUCAP,  in the last 168 hours  Time coordinating discharge: 45 minutes  Signed:  Natali Lavallee  [facility] 11/26/2012, 10:08 AM

## 2012-11-26 NOTE — Progress Notes (Signed)
ANTIBIOTIC CONSULT NOTE - FOLLOW UP  Pharmacy Consult for Cipro Indication: Colitis  Allergies  Allergen Reactions  . Benazepril Hcl     REACTION: cough  . Epinephrine Hcl     REACTION: shakes  . Lidocaine Other (See Comments)    Per [facility]    Patient Measurements: Height: 5\' 8"  (172.7 cm) Weight: 118 lb 3.2 oz (53.615 kg) IBW/kg (Calculated) : 63.9  Vital Signs: Temp: 98.5 F (36.9 C) (03/28 0454) Temp src: Oral (03/28 0454) BP: 153/56 mmHg (03/28 0529) Pulse Rate: 71 (03/28 0454) Intake/Output from previous day: 03/27 0701 - 03/28 0700 In: 1568.8 [P.O.:480; I.V.:388.8; IV Piggyback:700] Out: 1075 [Urine:1075]  Labs:  Recent Labs  11/24/12 0427 11/25/12 0430 11/26/12 0540  WBC 4.7 5.5 6.5  HGB 9.7* 9.5* 9.7*  PLT 265 249 253  CREATININE 0.53 0.60 0.82   Estimated Creatinine Clearance: 41.7 ml/min (by C-G formula based on Cr of 0.82).  Microbiology: Recent Results (from the past 720 hour(s))  MRSA PCR SCREENING     Status: None   Collection Time    11/23/12  2:10 AM      Result Value Range Status   MRSA by PCR NEGATIVE  NEGATIVE Final   Comment:            The GeneXpert MRSA Assay (FDA     approved for NASAL specimens     only), is one component of a     comprehensive MRSA colonization     surveillance program. It is not     intended to diagnose MRSA     infection nor to guide or     monitor treatment for     MRSA infections.  CLOSTRIDIUM DIFFICILE BY PCR     Status: None   Collection Time    11/23/12  9:20 AM      Result Value Range Status   C difficile by pcr NEGATIVE  NEGATIVE Final    Anti-infectives   Start     Dose/Rate Route Frequency Ordered Stop   11/25/12 0000  ciprofloxacin (CIPRO) 500 MG tablet     500 mg Oral 2 times daily 11/25/12 0847     11/25/12 0000  metroNIDAZOLE (FLAGYL) 500 MG tablet     500 mg Oral 3 times daily 11/25/12 0847     11/23/12 0200  metroNIDAZOLE (FLAGYL) IVPB 500 mg     500 mg 100 mL/hr over 60 Minutes  Intravenous 3 times per day 11/23/12 0141     11/23/12 0145  ciprofloxacin (CIPRO) IVPB 400 mg     400 mg 200 mL/hr over 60 Minutes Intravenous 2 times daily 11/23/12 0144        Assessment: 88 yof admit 3/24 with rectal bleeding.  She recently had ischemic colitis with recent sepsis.  Pharmacy asked to assist with Cipro dosing.  Day #4 Cipro IV and Flagyl IV (per MD)  SCr is stable, CrCl ~ 42 ml/min, WBC 6.5, Afebrile  Pt reports tolerating PO diet.  Goal of Therapy:  Appropriate abx dosing, eradication of infection.   Plan:  Continue cipro.400mg  IV q12h Recommend changing to PO antibiotics when able (also recommended by GI consult).  Lynann Beaver PharmD, BCPS Pager [phone] 11/26/2012 7:25 AM

## 2012-11-29 NOTE — Telephone Encounter (Signed)
Resident returned from the hospital and has a bed sore, (open) on left buttock.  Previousely resident had an order for barrier cream, please advise.  Per Dr [name], OK to use barrier cream and also OK to order wound care specialist.  I faxed back the order and asked clinical staff to re-fax updated order requests as things may have changed since recent hospital stay.

## 2012-11-30 ENCOUNTER — Ambulatory Visit (INDEPENDENT_AMBULATORY_CARE_PROVIDER_SITE_OTHER): Payer: Medicare Other | Admitting: Family Medicine

## 2012-11-30 ENCOUNTER — Encounter: Payer: Self-pay | Admitting: Family Medicine

## 2012-11-30 VITALS — BP 130/70 | Temp 98.4°F | Wt 119.0 lb

## 2012-11-30 DIAGNOSIS — K559 Vascular disorder of intestine, unspecified: Secondary | ICD-10-CM

## 2012-11-30 DIAGNOSIS — D649 Anemia, unspecified: Secondary | ICD-10-CM

## 2012-11-30 DIAGNOSIS — I10 Essential (primary) hypertension: Secondary | ICD-10-CM

## 2012-11-30 DIAGNOSIS — E871 Hypo-osmolality and hyponatremia: Secondary | ICD-10-CM

## 2012-11-30 LAB — CBC WITH DIFFERENTIAL/PLATELET
Basophils Absolute: 0.1 10*3/uL (ref 0.0–0.1)
Eosinophils Absolute: 0.1 10*3/uL (ref 0.0–0.7)
Hemoglobin: 10.1 g/dL — ABNORMAL LOW (ref 12.0–15.0)
Lymphocytes Relative: 13.6 % (ref 12.0–46.0)
MCHC: 33.7 g/dL (ref 30.0–36.0)
Monocytes Relative: 9.3 % (ref 3.0–12.0)
Neutro Abs: 6.4 10*3/uL (ref 1.4–7.7)
Neutrophils Relative %: 75.1 % (ref 43.0–77.0)
RBC: 3.34 Mil/uL — ABNORMAL LOW (ref 3.87–5.11)
RDW: 15.2 % — ABNORMAL HIGH (ref 11.5–14.6)

## 2012-11-30 LAB — BASIC METABOLIC PANEL
CO2: 23 mEq/L (ref 19–32)
Chloride: 103 mEq/L (ref 96–112)
Creatinine, Ser: 0.8 mg/dL (ref 0.4–1.2)
Potassium: 3.4 mEq/L — ABNORMAL LOW (ref 3.5–5.1)

## 2012-11-30 NOTE — Telephone Encounter (Signed)
Pt was here for OV today, all questions and orders taken care of

## 2012-11-30 NOTE — Progress Notes (Signed)
Subjective:    Patient ID: Jo Alvarez, female    DOB: 18-Aug-1925, 79 y.o.   MRN: [id number]  HPI Hospital follow up The patient was admitted back in February with ischemic colitis and possible sepsis. She was then readmitted 3/24 through 11/27/2011 recurrent bleeding per rectum and weakness. She had recurrence of diarrhea and bloody stools and suspected recurrent ischemic colitis She was treated with antibiotics for 7 days and hemoglobin remains stable. She had previous stool studies including culture, O&P, C. difficile all negative. Patient denies any recent constipation issues.  Patient as recent as 2 days ago had minimal bright red blood per rectum. History of internal hemorrhoids. Denies abdominal pain, fever, or dizziness  She had brief run of A. tach in-hospital and elevated blood pressure and was started on low-dose metoprolol. She's had past history of paroxysmal atrial fibrillation but not a candidate for anticoagulants because of bleeding issues above  Patient had hemoglobin 9 range and mild hyponatremia with sodium 131. She does not take any diuretics. She had iron studies which revealed iron deficiency. B12 normal.  She has history of chronic neuropathic pain and chronic low back pain. She had been on regular opioids and these were changed to when necessary during hospitalization. She's actually doing fairly well this time regarding her chronic pain issues.  Past Medical History  Diagnosis Date  . HYPONATREMIA 01/01/2009  . ANEMIA 08/20/2009  . LEUKOCYTOSIS 08/20/2009  . DEPRESSION, MAJOR, RECURRENT 02/01/2009  . Unspecified hearing loss 11/17/2008  . HYPERTENSION 11/17/2008  . TEMPORAL ARTERITIS 08/20/2009  . PYELONEPHRITIS 09/10/2009  . Cellulitis and abscess of upper arm and forearm 04/16/2010  . PRESSURE ULCER BUTTOCK 02/01/2009  . BACK PAIN, LUMBAR 07/29/2010  . WEAKNESS 01/01/2009  . Anorexia 12/19/2008  . Headache 04/18/2010  . DIARRHEA, CHRONIC 12/19/2008  . Stress  incontinence   . Hx: UTI (urinary tract infection)   . Blood transfusion   . Foot drop, bilateral   . Clostridium difficile colitis     History of  . Pancreatitis     History of  . Anxiety   . DEGENERATIVE JOINT DISEASE 11/17/2008    shoulders, arms  . History of skin cancer   . Dysrhythmia     Hx of paroxysmal a-fib  . H/O psoriatic arthritis   . History of SIADH   . H/O hypokalemia   . H/O mitral valve prolapse   . 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 Cholecystectomy    . Abdominal hysterectomy    . Lumbar laminectomy    . Temporal artery biopsy / ligation  06/04/2009  . Right cataract extraction      reports that she has never smoked. She has never used smokeless tobacco. She reports that she does not drink alcohol or use illicit drugs. family history is negative for Cancer, and Hypertension, and Arthritis, . Allergies  Allergen Reactions  . Benazepril Hcl     REACTION: cough  . Epinephrine Hcl     REACTION: shakes  . Lidocaine Other (See Comments)    Per MAR       Review of Systems  Constitutional: Positive for fatigue. Negative for fever, chills and appetite change.  HENT: Negative for trouble swallowing.   Respiratory: Negative for cough and shortness of breath.   Cardiovascular: Negative for chest pain.  Gastrointestinal: Negative for abdominal pain.  Genitourinary: Negative for dysuria.  Neurological: Negative for dizziness.  Objective:   Physical Exam  Constitutional: She appears well-developed and well-nourished.  HENT:  Mouth/Throat: Oropharynx is clear and moist.  Cardiovascular: Normal rate and regular rhythm.   Pulmonary/Chest: Effort normal and breath sounds normal. No respiratory distress. She has no wheezes. She has no rales.  Musculoskeletal: She exhibits no edema.  Neurological: She is alert.          Assessment & Plan:   #1 recurrent  ischemic colitis. Currently stable. Avoid constipation. High-fiber diet recommended #2 iron deficiency anemia. Recheck CBC. Start iron sulfate replacement and recheck CBC in one month #3 mild hyponatremia. Recheck basic metabolic panel #4 hypertension. Currently stable. Continue current medications #5 history of chronic pain related to chronic low back pain and peripheral neuropathy. Continue when necessary opioids. Ideally would like to minimize these to avoid risk of constipation

## 2012-12-01 NOTE — Progress Notes (Signed)
Quick Note:  Pt son [name] informed ______

## 2012-12-02 ENCOUNTER — Telehealth: Payer: Self-pay | Admitting: Family Medicine

## 2012-12-02 NOTE — Telephone Encounter (Signed)
Vernona Rieger with [facility] called stating that she need a verbal order to treat an open area on the patient's buttocks. Vernona Rieger need an order for an allevyn change every 3 days prn. Please assist.

## 2012-12-02 NOTE — Telephone Encounter (Signed)
Vernona Rieger is an RN with [facility] and she did an assessment and feels the allevyn is a good choice for this pt for getting bed sore healed.  I did give the verbal order.  FYI

## 2012-12-03 ENCOUNTER — Telehealth: Payer: Self-pay | Admitting: Family Medicine

## 2012-12-03 NOTE — Telephone Encounter (Signed)
I spoke with Physical Therapist and he is calling after initial evaluation.  PT with work with pt 3 X week for 4 weeks and then 2 X week for 1 week. Occupational Therapy, Floyde Parkins evaluated pt also today and he will see her 1 X week for 4 weeks.  Verbal OK given.  FYI

## 2012-12-13 ENCOUNTER — Telehealth: Payer: Self-pay | Admitting: Family Medicine

## 2012-12-13 ENCOUNTER — Telehealth: Payer: Self-pay | Admitting: *Deleted

## 2012-12-13 MED ORDER — OXYCODONE-ACETAMINOPHEN 5-325 MG PO TABS: 1.0000 | ORAL_TABLET | Freq: Four times a day (QID) | ORAL | Status: DC | PRN

## 2012-12-13 NOTE — Telephone Encounter (Signed)
Joni Reining at assisted living Verbal order given for CXR.  I asked if pt was running a fever.   Nichole said no, however pt seems more confused today, pt has been incontinent of urine.  Oked and order for UA also.  Joni Reining also reported pt has some reddened toes where the sheets rub on her toes when in bed.  I Oked an order for a support for the sheets so they do not touch her feet.

## 2012-12-13 NOTE — Telephone Encounter (Signed)
Calling to report that patient has productive cough with yellow spetum.  Requesting an order for chest x-ray today since mobile chest x-ray unit will be on site today for another patient.

## 2012-12-13 NOTE — Telephone Encounter (Signed)
Rx will be faxed as requested to [facility], pharmacy for [facility], [place], [place] [phone]

## 2012-12-13 NOTE — Telephone Encounter (Signed)
Refill OK

## 2012-12-13 NOTE — Telephone Encounter (Signed)
Oxycodone refill request, every 6 hours #120 with 0 refills.  Last refill doe by Dr Lovell Sheehan when Dr [name] ws out of office.  Since then has been given as an in patient

## 2012-12-16 ENCOUNTER — Telehealth: Payer: Self-pay | Admitting: Family Medicine

## 2012-12-16 NOTE — Telephone Encounter (Signed)
LMTCB

## 2012-12-16 NOTE — Telephone Encounter (Signed)
Jo Alvarez from [facility] would not elaborate is requesting nancy to return her call

## 2012-12-20 NOTE — Telephone Encounter (Signed)
I returned Joan's call again today and she reports concern for pt stage 1 skin breakdown on her heels and toes bil.  They have already implemented something to raise the sheets off her toes, however her heels are also becoming a concern.  Also pt has hx of stage ll breakdown on her coccyx.  I gave Aurea Graff the OK to order a foam foot support and she is going to fax orders to be signed.  Pt son [name] aware also.

## 2012-12-31 ENCOUNTER — Encounter: Payer: Self-pay | Admitting: Family Medicine

## 2013-01-27 ENCOUNTER — Telehealth: Payer: Self-pay | Admitting: Family Medicine

## 2013-01-27 NOTE — Telephone Encounter (Signed)
appt set/kh 

## 2013-01-27 NOTE — Telephone Encounter (Signed)
OK to make same day for day other than Friday

## 2013-01-27 NOTE — Telephone Encounter (Signed)
Son would like to come in and discuss pt's medical status. For financial reasons, needs to come in ASAP. No 30 min apt until next Friday. Pls advise. (Except same day)

## 2013-01-29 ENCOUNTER — Encounter (HOSPITAL_COMMUNITY): Payer: Self-pay | Admitting: *Deleted

## 2013-01-29 ENCOUNTER — Emergency Department (HOSPITAL_COMMUNITY): Payer: Medicare Other

## 2013-01-29 ENCOUNTER — Emergency Department (HOSPITAL_COMMUNITY)
Admission: EM | Admit: 2013-01-29 | Discharge: 2013-01-29 | Disposition: A | Discharge status: Skilled Nursing Facility | Payer: Medicare Other | Attending: Emergency Medicine | Admitting: Emergency Medicine

## 2013-01-29 DIAGNOSIS — D649 Anemia, unspecified: Secondary | ICD-10-CM | POA: Insufficient documentation

## 2013-01-29 DIAGNOSIS — H9190 Unspecified hearing loss, unspecified ear: Secondary | ICD-10-CM | POA: Insufficient documentation

## 2013-01-29 DIAGNOSIS — Z8669 Personal history of other diseases of the nervous system and sense organs: Secondary | ICD-10-CM | POA: Insufficient documentation

## 2013-01-29 DIAGNOSIS — Z862 Personal history of diseases of the blood and blood-forming organs and certain disorders involving the immune mechanism: Secondary | ICD-10-CM | POA: Insufficient documentation

## 2013-01-29 DIAGNOSIS — F339 Major depressive disorder, recurrent, unspecified: Secondary | ICD-10-CM | POA: Insufficient documentation

## 2013-01-29 DIAGNOSIS — Z8639 Personal history of other endocrine, nutritional and metabolic disease: Secondary | ICD-10-CM | POA: Insufficient documentation

## 2013-01-29 DIAGNOSIS — M545 Low back pain, unspecified: Secondary | ICD-10-CM | POA: Insufficient documentation

## 2013-01-29 DIAGNOSIS — Z8719 Personal history of other diseases of the digestive system: Secondary | ICD-10-CM | POA: Insufficient documentation

## 2013-01-29 DIAGNOSIS — K5900 Constipation, unspecified: Secondary | ICD-10-CM | POA: Insufficient documentation

## 2013-01-29 DIAGNOSIS — Z8679 Personal history of other diseases of the circulatory system: Secondary | ICD-10-CM | POA: Insufficient documentation

## 2013-01-29 DIAGNOSIS — Z90710 Acquired absence of both cervix and uterus: Secondary | ICD-10-CM | POA: Insufficient documentation

## 2013-01-29 DIAGNOSIS — Z87448 Personal history of other diseases of urinary system: Secondary | ICD-10-CM | POA: Insufficient documentation

## 2013-01-29 DIAGNOSIS — Z79899 Other long term (current) drug therapy: Secondary | ICD-10-CM | POA: Insufficient documentation

## 2013-01-29 DIAGNOSIS — Z9089 Acquired absence of other organs: Secondary | ICD-10-CM | POA: Insufficient documentation

## 2013-01-29 DIAGNOSIS — F411 Generalized anxiety disorder: Secondary | ICD-10-CM | POA: Insufficient documentation

## 2013-01-29 DIAGNOSIS — Z85828 Personal history of other malignant neoplasm of skin: Secondary | ICD-10-CM | POA: Insufficient documentation

## 2013-01-29 DIAGNOSIS — I10 Essential (primary) hypertension: Secondary | ICD-10-CM | POA: Insufficient documentation

## 2013-01-29 DIAGNOSIS — L440 Pityriasis rubra pilaris: Secondary | ICD-10-CM | POA: Insufficient documentation

## 2013-01-29 DIAGNOSIS — Z87440 Personal history of urinary (tract) infections: Secondary | ICD-10-CM | POA: Insufficient documentation

## 2013-01-29 DIAGNOSIS — N390 Urinary tract infection, site not specified: Secondary | ICD-10-CM | POA: Insufficient documentation

## 2013-01-29 DIAGNOSIS — M1990 Unspecified osteoarthritis, unspecified site: Secondary | ICD-10-CM | POA: Insufficient documentation

## 2013-01-29 DIAGNOSIS — Z872 Personal history of diseases of the skin and subcutaneous tissue: Secondary | ICD-10-CM | POA: Insufficient documentation

## 2013-01-29 LAB — CBC WITH DIFFERENTIAL/PLATELET
Eosinophils Absolute: 0 10*3/uL (ref 0.0–0.7)
Eosinophils Relative: 0 % (ref 0–5)
HCT: 33.9 % — ABNORMAL LOW (ref 36.0–46.0)
Hemoglobin: 11 g/dL — ABNORMAL LOW (ref 12.0–15.0)
Lymphocytes Relative: 4 % — ABNORMAL LOW (ref 12–46)
Lymphs Abs: 0.8 10*3/uL (ref 0.7–4.0)
MCH: 29.3 pg (ref 26.0–34.0)
MCV: 90.2 fL (ref 78.0–100.0)
Monocytes Absolute: 0.7 10*3/uL (ref 0.1–1.0)
Monocytes Relative: 4 % (ref 3–12)
RBC: 3.76 MIL/uL — ABNORMAL LOW (ref 3.87–5.11)
WBC: 18.4 10*3/uL — ABNORMAL HIGH (ref 4.0–10.5)

## 2013-01-29 LAB — URINALYSIS, ROUTINE W REFLEX MICROSCOPIC
Bilirubin Urine: NEGATIVE
Glucose, UA: NEGATIVE mg/dL
Hgb urine dipstick: NEGATIVE
Protein, ur: NEGATIVE mg/dL

## 2013-01-29 LAB — URINE MICROSCOPIC-ADD ON

## 2013-01-29 LAB — BASIC METABOLIC PANEL
BUN: 20 mg/dL (ref 6–23)
CO2: 27 mEq/L (ref 19–32)
Calcium: 8.5 mg/dL (ref 8.4–10.5)
GFR calc non Af Amer: 76 mL/min — ABNORMAL LOW (ref >90)
Glucose, Bld: 143 mg/dL — ABNORMAL HIGH (ref 70–99)
Sodium: 130 mEq/L — ABNORMAL LOW (ref 135–145)

## 2013-01-29 LAB — CG4 I-STAT (LACTIC ACID): Lactic Acid, Venous: 0.97 mmol/L (ref 0.5–2.2)

## 2013-01-29 MED ORDER — DEXTROSE 5 % IV SOLN
1.0000 g | Freq: Once | INTRAVENOUS | Status: AC
  Administered 2013-01-29: 1 g via INTRAVENOUS
  Filled 2013-01-29: qty 10

## 2013-01-29 MED ORDER — SODIUM CHLORIDE 0.9 % IV BOLUS (SEPSIS)
500.0000 mL | Freq: Once | INTRAVENOUS | Status: AC
  Administered 2013-01-29: 500 mL via INTRAVENOUS

## 2013-01-29 MED ORDER — CEPHALEXIN 500 MG PO CAPS: 500.0000 mg | ORAL_CAPSULE | Freq: Four times a day (QID) | ORAL | Status: DC

## 2013-01-29 MED ORDER — SODIUM CHLORIDE 0.9 % IV BOLUS (SEPSIS)
1000.0000 mL | Freq: Once | INTRAVENOUS | Status: AC
  Administered 2013-01-29: 1000 mL via INTRAVENOUS

## 2013-01-29 NOTE — ED Notes (Signed)
[facility] called for transport.  

## 2013-01-29 NOTE — ED Notes (Signed)
Per ems: pt from [facility], staff called EMS d/t pt having a large BM this morning and has not been "acting like herself" since. Staff want pt to be evaluated for dehydration. VSS pulse 60, bp 118/62, respirations 18 even/unlabored, saO2 98% ra. Pt DNR

## 2013-01-29 NOTE — ED Provider Notes (Signed)
History     CSN: [id number]  Arrival date & time 01/29/13  1624   First MD Initiated Contact with Patient 01/29/13 1629      No chief complaint on file.   (Consider location/radiation/quality/duration/timing/severity/associated sxs/prior treatment) HPI  79 year old female was brought here via EMS for questionable dehydration. Per ems: pt from [facility], staff called EMS d/t pt having a large BM this morning and has not been "acting like herself" since. Staff want pt to be evaluated for dehydration. VSS pulse 60, bp 118/62, respirations 18 even/unlabored, saO2 98% ra. Pt DNR.  Patient reports she has been having persistent low back pain ongoing for the past week. Describe pain as a sharp achy sensation, nonradiating, persistent, worsening with movement. She reports taking her pain medication does help. She has had similar pain like this in the past. She denies any recent trauma. Patient also mentioned that she feels dehydrated. She has asked for water but the staff has not been giving her adequate hydration. She also reports having constipation for the past 3 or 4 days but has had a large bowel movement this morning and felt better. Otherwise patient denies fever, chills, headache, neck pain, chest pain, shortness of breath, abdominal pain, nausea, vomiting, diarrhea, dysuria, numbness or weakness. Patient is wheelchair-bound   Past Medical History  Diagnosis Date  . HYPONATREMIA 01/01/2009  . ANEMIA 08/20/2009  . LEUKOCYTOSIS 08/20/2009  . DEPRESSION, MAJOR, RECURRENT 02/01/2009  . Unspecified hearing loss 11/17/2008  . HYPERTENSION 11/17/2008  . TEMPORAL ARTERITIS 08/20/2009  . PYELONEPHRITIS 09/10/2009  . Cellulitis and abscess of upper arm and forearm 04/16/2010  . PRESSURE ULCER BUTTOCK 02/01/2009  . BACK PAIN, LUMBAR 07/29/2010  . WEAKNESS 01/01/2009  . Anorexia 12/19/2008  . Headache 04/18/2010  . DIARRHEA, CHRONIC 12/19/2008  . Stress incontinence   . Hx: UTI (urinary tract  infection)   . Blood transfusion   . Foot drop, bilateral   . Clostridium difficile colitis     History of  . Pancreatitis     History of  . Anxiety   . DEGENERATIVE JOINT DISEASE 11/17/2008    shoulders, arms  . History of skin cancer   . Dysrhythmia     Hx of paroxysmal a-fib  . H/O psoriatic arthritis   . History of SIADH   . H/O hypokalemia   . H/O mitral valve prolapse   . 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 Cholecystectomy    . Abdominal hysterectomy    . Lumbar laminectomy    . Temporal artery biopsy / ligation  06/04/2009  . Right cataract extraction      Family History  Problem Relation Age of Onset  . Cancer Neg Hx     family  . Hypertension Neg Hx     family  . Arthritis Neg Hx     family    History  Substance Use Topics  . Smoking status: Never Smoker   . Smokeless tobacco: Never Used  . Alcohol Use: No    OB History   Grav Para Term Preterm Abortions TAB SAB Ect Mult Living                  Review of Systems  Constitutional:       10 Systems reviewed and all are negative for acute change except as noted in the HPI.     Allergies  Benazepril hcl;  Epinephrine hcl; and Lidocaine  Home Medications   Current Outpatient Rx  Name  Route  Sig  Dispense  Refill  . amLODipine (NORVASC) 10 MG tablet   Oral   Take 1 tablet (10 mg total) by mouth daily.   30 tablet   11   . Calcium Carbonate-Vitamin D (CALCIUM-VITAMIN D) 500-200 MG-UNIT per tablet   Oral   Take 1 tablet by mouth 2 (two) times daily with a meal.   60 tablet   11   . ciprofloxacin (CIPRO) 500 MG tablet   Oral   Take 1 tablet (500 mg total) by mouth 2 (two) times daily.   10 tablet   0   . cloNIDine (CATAPRES) 0.1 MG tablet   Oral   Take 1 tablet (0.1 mg total) by mouth daily.   30 tablet   11   . diazepam (VALIUM) 5 MG tablet   Oral   Take 1 tablet (5 mg total) by mouth every  12 (twelve) hours as needed. anxiety   30 tablet   5   . gabapentin (NEURONTIN) 300 MG capsule   Oral   Take 1 capsule (300 mg total) by mouth 2 (two) times daily.   60 capsule   5   . leflunomide (ARAVA) 10 MG tablet   Oral   Take 15 mg by mouth daily.         Marland Kitchen loperamide (IMODIUM) 2 MG capsule   Oral   Take 2 mg by mouth 4 (four) times daily as needed. For diarrhea         . metoprolol succinate (TOPROL-XL) 12.5 mg TB24   Oral   Take 0.5 tablets (12.5 mg total) by mouth daily.   45 tablet   0   . metroNIDAZOLE (FLAGYL) 500 MG tablet   Oral   Take 1 tablet (500 mg total) by mouth 3 (three) times daily.   15 tablet   0   . Multiple Vitamins-Minerals (MULTIVITAMINS THER. W/MINERALS) TABS   Oral   Take 1 tablet by mouth daily.   30 each   5   . Multiple Vitamins-Minerals (PRESERVISION AREDS) CAPS   Oral   Take 2 capsules by mouth 2 (two) times daily.         Marland Kitchen oxyCODONE-acetaminophen (PERCOCET/ROXICET) 5-325 MG per tablet   Oral   Take 1 tablet by mouth every 6 (six) hours as needed for pain.   120 tablet   0   . polyethylene glycol (MIRALAX / GLYCOLAX) packet   Oral   Take 17 g by mouth daily.         . predniSONE (DELTASONE) 10 MG tablet   Oral   Take 1 tablet (10 mg total) by mouth daily.   30 tablet   11   . promethazine (PHENERGAN) 25 MG tablet   Oral   Take 25 mg by mouth every 6 (six) hours as needed for nausea.          . ranitidine (ZANTAC) 150 MG tablet   Oral   Take 1 tablet (150 mg total) by mouth 2 (two) times daily. For reflux.   60 tablet   5   . traMADol (ULTRAM) 50 MG tablet   Oral   Take 50 mg by mouth every 6 (six) hours as needed for pain.           There were no vitals taken for this visit.  Physical Exam  Nursing note and vitals reviewed. Constitutional: She  is oriented to person, place, and time. She appears well-developed and well-nourished. No distress.  Awake, alert, nontoxic appearance  HENT:  Head:  Atraumatic.  Oral mucosa dry, tongue is dry  Eyes: Conjunctivae are normal. Right eye exhibits no discharge. Left eye exhibits no discharge.  Neck: Neck supple.  Cardiovascular: Normal rate and regular rhythm.   Pulmonary/Chest: Effort normal. No respiratory distress. She exhibits no tenderness.  Abdominal: Soft. There is no tenderness. There is no rebound.  Musculoskeletal: She exhibits no tenderness (Bilateral paralumbar tenderness on palpation without significant midline spine tenderness, crepitus, or step-off noted.).  ROM appears intact, no obvious focal weakness  Neurological: She is alert and oriented to person, place, and time. GCS eye subscore is 4. GCS verbal subscore is 5. GCS motor subscore is 6.  Mental status and motor strength appears intact  5/5 strength to all 4 extremities  Skin: No rash noted.  Psychiatric: She has a normal mood and affect.    ED Course  Procedures (including critical care time)  7:42 PM Pt reports fatigue and feeling dehydrated.  She is mentating well, appears nontoxic.  Is febrile, VSS.  UA suspicious of UTI, rocephin given.  Does have elevated WBC of 18.4 with left shift.  Abdomen nonsurgical.  Basic labs shows mild dehydration. IVF given.    9:38 PM Normal lactic acid.  Pt mentating at baseline.  VSS.  My attending has evaluate pt and felt pt stable for discharge with keflex as treatment.  Pt to f/u with pCP for further care.    Labs Reviewed  CBC WITH DIFFERENTIAL - Abnormal; Notable for the following:    WBC 18.4 (*)    RBC 3.76 (*)    Hemoglobin 11.0 (*)    HCT 33.9 (*)    Neutrophils Relative % 92 (*)    Neutro Abs 16.9 (*)    Lymphocytes Relative 4 (*)    All other components within normal limits  BASIC METABOLIC PANEL - Abnormal; Notable for the following:    Sodium 130 (*)    Glucose, Bld 143 (*)    GFR calc non Af Amer 76 (*)    GFR calc Af Amer 88 (*)    All other components within normal limits  URINALYSIS, ROUTINE W REFLEX  MICROSCOPIC - Abnormal; Notable for the following:    APPearance CLOUDY (*)    Leukocytes, UA SMALL (*)    All other components within normal limits  URINE MICROSCOPIC-ADD ON - Abnormal; Notable for the following:    Bacteria, UA MANY (*)    All other components within normal limits  URINE CULTURE  CG4 I-STAT (LACTIC ACID)   Dg Chest 2 View  01/29/2013   *RADIOLOGY REPORT*  Clinical Data: Fatigue, weakness  CHEST - 2 VIEW  Comparison: 01/21/2012  Findings: Chronic interstitial markings.  Left upper lobe scarring. No focal consolidation. No pleural effusion or pneumothorax.  The heart is normal in size.  Degenerative changes of the visualized thoracolumbar spine. Moderate to severe degenerative changes of the left shoulder.  IMPRESSION: No evidence of acute cardiopulmonary disease.   Original Report Authenticated By: Charline Bills, M.D.   Dg Lumbar Spine Complete  01/29/2013   *RADIOLOGY REPORT*  Clinical Data: Low back pain.  No known injury.  LUMBAR SPINE - COMPLETE 4+ VIEW  Comparison: 09/09/2010  Findings: No evidence of acute fracture, spondylolysis, or spondylolisthesis.  Severe degenerative disc disease is seen at all lumbar levels which is not significant change.  Bilateral facet DJD  also demonstrated which is worse in the lower lumbar spine.  Moderate levoscoliosis is also stable.  No focal lytic or sclerotic bone lesions seen.  Diffuse atherosclerotic calcification abdominal aorta noted without evidence of aneurysm.  Bipolar left hip prosthesis again seen with protrusio acetabulae.  Diffuse osteopenia noted.  IMPRESSION:  1.  No acute findings. 2.  No significant change in severe lumbar spondylosis and moderate levoscoliosis. 3.  Osteopenia.   Original Report Authenticated By: Myles Rosenthal, M.D.     1. UTI (lower urinary tract infection)       MDM  BP 122/61  Pulse 67  Temp(Src) 98.1 F (36.7 C) (Oral)  Resp 14  SpO2 97%  I have reviewed nursing notes and vital signs. I  personally reviewed the imaging tests through PACS system  I reviewed available ER/hospitalization records thought the EMR         Fayrene Helper, [place] 01/29/13 2141

## 2013-01-29 NOTE — ED Notes (Signed)
JYN:[id number]<AO> Expected date:01/29/13<BR> Expected time: 4:21 PM<BR> Means of arrival:Ambulance<BR> Comments:<BR> ? dehydration

## 2013-01-31 ENCOUNTER — Ambulatory Visit (INDEPENDENT_AMBULATORY_CARE_PROVIDER_SITE_OTHER): Payer: Medicare Other | Admitting: Family Medicine

## 2013-01-31 DIAGNOSIS — G629 Polyneuropathy, unspecified: Secondary | ICD-10-CM

## 2013-01-31 DIAGNOSIS — G609 Hereditary and idiopathic neuropathy, unspecified: Secondary | ICD-10-CM

## 2013-01-31 DIAGNOSIS — M545 Low back pain: Secondary | ICD-10-CM

## 2013-01-31 DIAGNOSIS — M1990 Unspecified osteoarthritis, unspecified site: Secondary | ICD-10-CM

## 2013-01-31 DIAGNOSIS — I10 Essential (primary) hypertension: Secondary | ICD-10-CM

## 2013-01-31 NOTE — Progress Notes (Signed)
Subjective:    Patient ID: Jo Alvarez, female    DOB: 12-13-1924, 79 y.o.   MRN: [id number]  HPI Son is here to discuss her chronic care. Patient has multiple chronic problems including history of hypertension, history of chronic anemia, decubitus skin ulcers, intermittent diarrhea, giant cell arteritis, history of hyponatremia, chronic peripheral neuropathy, osteoarthritis with chronic low back pain.  Currently resides at assisted living complex and son is here to discuss the fact that she will likely need skilled nursing care very soon. In addition of things like skin care issues (decubiti)  she's had progressive decline in ADLs. Requires assistance with bathing and dressing. She is starting to have some cognitive decline as well.  Chronic pain which currently treated with as needed Percocet. She's been schedule in the past and he feels that schedule every 6 hours work more effectively. He feels they're chasing her pain control at this time with prn scheduling.. She has chronic pain both from chronic back pain as well as neuropathic pain.  Recent urinary tract infection which is currently stable  Past Medical History  Diagnosis Date  . HYPONATREMIA 01/01/2009  . ANEMIA 08/20/2009  . LEUKOCYTOSIS 08/20/2009  . DEPRESSION, MAJOR, RECURRENT 02/01/2009  . Unspecified hearing loss 11/17/2008  . HYPERTENSION 11/17/2008  . TEMPORAL ARTERITIS 08/20/2009  . PYELONEPHRITIS 09/10/2009  . Cellulitis and abscess of upper arm and forearm 04/16/2010  . PRESSURE ULCER BUTTOCK 02/01/2009  . BACK PAIN, LUMBAR 07/29/2010  . WEAKNESS 01/01/2009  . Anorexia 12/19/2008  . Headache(784.0) 04/18/2010  . DIARRHEA, CHRONIC 12/19/2008  . Stress incontinence   . Hx: UTI (urinary tract infection)   . Blood transfusion   . Foot drop, bilateral   . Clostridium difficile colitis     History of  . Pancreatitis     History of  . Anxiety   . DEGENERATIVE JOINT DISEASE 11/17/2008    shoulders, arms  . History of skin  cancer   . Dysrhythmia     Hx of paroxysmal a-fib  . H/O psoriatic arthritis   . History of SIADH   . H/O hypokalemia   . H/O mitral valve prolapse   . 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 Cholecystectomy    . Abdominal hysterectomy    . Lumbar laminectomy    . Temporal artery biopsy / ligation  06/04/2009  . Right cataract extraction      reports that she has never smoked. She has never used smokeless tobacco. She reports that she does not drink alcohol or use illicit drugs. family history is negative for Cancer, and Hypertension, and Arthritis, . Allergies  Allergen Reactions  . Benazepril Hcl     REACTION: cough  . Epinephrine Hcl     REACTION: shakes  . Lidocaine Other (See Comments)    Per MAR      Review of Systems Met with son.  Not obtained from patient.  This was a consultation visit.    Objective:   Physical Exam Not done.  As above, consultation visit.       Assessment & Plan:  Elderly female with complex PMH as above and recent progressive failure to thrive. She is requiring additional care for ADLs and we do feel skilled nursing care is necessary at this time.  She has had extensive PT in past.  She is essentially nonambulatory at this time.  Will fill out appropriate FL-2 forms. We  spent 30 minutes in counseling and discussing future care options.

## 2013-02-01 LAB — URINE CULTURE: Colony Count: >=100000

## 2013-02-02 NOTE — Progress Notes (Signed)
ED Antimicrobial Stewardship Positive Culture Follow Up   Jo Alvarez is an 79 y.o. female who presented to [facility] on 01/29/2013 with a chief complaint of dehydration  Chief Complaint  Patient presents with  . Dehydration    Recent Results (from the past 720 hour(s))  URINE CULTURE     Status: None   Collection Time    01/29/13  5:34 PM      Result Value Range Status   Specimen Description URINE, CATHETERIZED   Final   Special Requests NONE   Final   Culture  Setup Time 01/30/2013 01:59   Final   Colony Count >=100,000 COLONIES/ML   Final   Culture ENTEROCOCCUS SPECIES   Final   Report Status 02/01/2013 FINAL   Final   Organism ID, Bacteria ENTEROCOCCUS SPECIES   Final    [x]  Treated with Keflex, organism resistant to prescribed antimicrobial []  Patient discharged originally without antimicrobial agent and treatment is now indicated  New antibiotic prescription: Augmentin 500 mg every 12 hours for 7 days  ED Provider: Wynetta Emery, PA-C  Jo Alvarez 02/02/2013, 10:33 AM Infectious Diseases Pharmacist Phone# [phone]

## 2013-02-02 NOTE — ED Notes (Signed)
Post ED Visit - Positive Culture Follow-up: Successful Patient Follow-Up  Culture assessed and recommendations reviewed by: []  Wes Dulaney, Pharm.D., BCPS []  Celedonio Miyamoto, Pharm.D., BCPS [x]  Georgina Pillion, Pharm.D., BCPS []  [place], [address].D., BCPS, AAHIVP []  Estella Husk, Pharm.D., BCPS, AAHIVP  Positive urine culture   Changes discussed with ED provider: Warren Danes PA New antibiotic prescription Augmentin 500 PO Q 12 hr x seven days  Patient resident @ [address]. Called and notified patient's nurse [name]. Notifed of need for change in RX. Result and med orders faxed to [phone]   Jo Alvarez 02/02/2013, 3:39 PM

## 2013-02-03 NOTE — ED Provider Notes (Signed)
Medical screening examination/treatment/procedure(s) were performed by non-physician practitioner and as supervising physician I was immediately available for consultation/collaboration.  Aima Mcwhirt, MD 02/03/13 0530 

## 2013-02-04 ENCOUNTER — Other Ambulatory Visit: Payer: Self-pay | Admitting: *Deleted

## 2013-02-04 MED ORDER — OXYCODONE-ACETAMINOPHEN 5-325 MG PO TABS: 1.0000 | ORAL_TABLET | Freq: Four times a day (QID) | ORAL | Status: DC

## 2013-02-07 ENCOUNTER — Telehealth: Payer: Self-pay | Admitting: Family Medicine

## 2013-02-07 NOTE — Progress Notes (Signed)
Quick Note:  I spoke with pt son [name]). He will get in touch with his moms day nurse, Artist Pais and find out what is meant by the comment "Keflex not working". He has been to see his mom several times and nothing was said about it. ______

## 2013-02-07 NOTE — Telephone Encounter (Signed)
Calling to request orders to see the patient once a week for 1 week, twice a week for  6 weeks, and once a week for 2 weeks, and 2 PRNS. Please assist.

## 2013-02-09 NOTE — Progress Notes (Signed)
Quick Note:  I spoke with a med tech as Jo Alvarez was in a meeting. She reported pt was up for breakfast in dining hall today, a good sign. She has 4 more days on the Keflex. They will let us know if she is still C/O sx. ______

## 2013-03-16 ENCOUNTER — Telehealth: Payer: Self-pay | Admitting: Family Medicine

## 2013-03-16 NOTE — Telephone Encounter (Signed)
OK to initiate skin care referral.

## 2013-03-16 NOTE — Telephone Encounter (Signed)
The nurse called and stated that she would like to pick the pt up for wound care, also that she has a stage 2 wound on her buttox. She will need orders. Please assist.

## 2013-03-17 NOTE — Telephone Encounter (Signed)
Jo Alvarez a verbal ok per dr. [name] and Vernona Rieger will fax some papers over to the office to be signed

## 2013-03-17 NOTE — Telephone Encounter (Signed)
Correction. It looks like this might be for a verbal order to [facility]. If you have any questions Fleet Contras will be able to help!

## 2013-03-17 NOTE — Telephone Encounter (Signed)
Will need referral entered.  °

## 2013-03-18 ENCOUNTER — Telehealth: Payer: Self-pay

## 2013-03-18 NOTE — Telephone Encounter (Signed)
Cherlynn Kaiser from [facility] in regards to the pt about going out to see the pt 3 times a week for 4 weeks to clean the wound.  Per dr. [name] verbal ok.... Left detail message on Robinhood VM

## 2013-03-19 ENCOUNTER — Telehealth: Payer: Self-pay | Admitting: Family Medicine

## 2013-03-19 NOTE — Telephone Encounter (Signed)
Madia from [facility] called stating that they think patient has a UTI and needs an order(to check for UTI) faxed to them.   F: [phone]

## 2013-03-21 ENCOUNTER — Telehealth: Payer: Self-pay | Admitting: Family Medicine

## 2013-03-21 NOTE — Telephone Encounter (Signed)
We cannot address specifics for care without seeing the wound.  I suggest skin care consult as she has difficulty getting here.  If her place of residence uses someone they recommend that would be OK.

## 2013-03-21 NOTE — Telephone Encounter (Signed)
RN called and stated that the pt has a new wound on her left lateral breast. She wanted to make you aware that it looked like a soft tissue, or a blister that popped. She would like orders as to what you would like her to do with it. Please assist.

## 2013-03-22 NOTE — Telephone Encounter (Signed)
Left message for Jo Alvarez to call back.

## 2013-03-23 ENCOUNTER — Telehealth: Payer: Self-pay | Admitting: Family Medicine

## 2013-03-23 NOTE — Telephone Encounter (Signed)
Pt call nadine [phone] at [facility] instead of christina. Gloriajean Dell rn would like a order to discontinue behavioral nurse and have skilled nurse do dressing changes etc

## 2013-03-23 NOTE — Telephone Encounter (Signed)
PT son wanted to speak with you regarding a FL2 form that needs to be sent to [facility]. Please assist.

## 2013-03-23 NOTE — Telephone Encounter (Signed)
Please call Shawna at [phone] at [facility] place concerning FL2

## 2013-03-23 NOTE — Telephone Encounter (Signed)
OK to approve. 

## 2013-03-23 NOTE — Telephone Encounter (Signed)
Nurse states that the pt has a wound under her breast, she believes its from moisture. She did state that there was some dead skin. The pt also has another one on her back side. She would like orders to address both of these wounds while seeing the pt. Please assist.

## 2013-03-23 NOTE — Telephone Encounter (Signed)
Left message for Gloriajean Dell to call back

## 2013-03-23 NOTE — Telephone Encounter (Signed)
Jo Alvarez with [facility] is wanting a verbal Ok if they can discontinue the behavioral and let the skilled nursing do the dressing.  Jo Alvarez [phone]  [facility] place is faxing over the [facility] form.

## 2013-03-24 ENCOUNTER — Telehealth: Payer: Self-pay

## 2013-03-24 ENCOUNTER — Other Ambulatory Visit: Payer: Self-pay

## 2013-03-24 NOTE — Telephone Encounter (Signed)
Nurse Boneta Lucks notified per Dr. [name]

## 2013-03-24 NOTE — Telephone Encounter (Signed)
Pt son is bringing new form to office to be filled out

## 2013-03-24 NOTE — Telephone Encounter (Signed)
[name] - Jo Alvarez son is checking on an updated FL 2 for her so that he can get her in a new skilled nursing home tomorrow. He thinks the last one was updated sometime in June. He needs one that is dated less than 30 day. Would you please call him about this. [phone].

## 2013-03-25 ENCOUNTER — Telehealth: Payer: Self-pay | Admitting: Family Medicine

## 2013-03-25 NOTE — Telephone Encounter (Signed)
Jo Alvarez is  Coming to pick up paperwork

## 2013-03-25 NOTE — Telephone Encounter (Signed)
[facility] needs verbal order to cleanse would w/ normal saline, apply hydro gel to wound there, calcium alginate and secure w/ foam dressing.  Skilled nurse to see 2 x week for wound care.

## 2013-03-25 NOTE — Telephone Encounter (Signed)
Mr Jo Alvarez would like a call back concerning this paperwork  As soon as possible!

## 2013-03-28 NOTE — Telephone Encounter (Signed)
Jo Alvarez informed per Dr. [name]

## 2013-03-28 NOTE — Telephone Encounter (Signed)
OK to approve these requests.

## 2013-04-05 ENCOUNTER — Telehealth: Payer: Self-pay | Admitting: Family Medicine

## 2013-04-05 MED ORDER — CEPHALEXIN 500 MG PO CAPS: 500.0000 mg | ORAL_CAPSULE | Freq: Three times a day (TID) | ORAL | Status: DC

## 2013-04-05 NOTE — Telephone Encounter (Signed)
Fax to [facility]

## 2013-04-05 NOTE — Telephone Encounter (Signed)
Start Keflex 500 mg po Tid for 10 days.  Pt really needs to be seen if not improving over next few days.

## 2013-04-05 NOTE — Telephone Encounter (Signed)
Pt needs antibiotic for her toe wound.  Would is draining, red, warm, painful. This issue w/ toe has been going on for 3 weeks. This Nurse Foye Clock had not seen pt for 2 weeks and this is what she observed yesterday. Pt stays at [facility]. Nurse advised [facility] that she would request this antibiotic order and they are looking for this order.  Nurse would also like to get new orders for behavioral health for 2 X a week for  4 weeks and then 1 X wk for 5 weeks.

## 2013-04-11 DIAGNOSIS — L8991 Pressure ulcer of unspecified site, stage 1: Secondary | ICD-10-CM

## 2013-04-11 DIAGNOSIS — F329 Major depressive disorder, single episode, unspecified: Secondary | ICD-10-CM

## 2013-04-11 DIAGNOSIS — S91109A Unspecified open wound of unspecified toe(s) without damage to nail, initial encounter: Secondary | ICD-10-CM

## 2013-04-11 DIAGNOSIS — S21009A Unspecified open wound of unspecified breast, initial encounter: Secondary | ICD-10-CM

## 2013-04-11 DIAGNOSIS — L89309 Pressure ulcer of unspecified buttock, unspecified stage: Secondary | ICD-10-CM

## 2013-04-14 ENCOUNTER — Telehealth: Payer: Self-pay

## 2013-04-14 NOTE — Telephone Encounter (Signed)
Refill okay?  

## 2013-04-14 NOTE — Telephone Encounter (Signed)
Hard copy   Oxycodone-acetaminophen  5/325  Mg Take 1 tablet by mouth every 6 hours  #120 0 refills 02/04/13

## 2013-04-15 MED ORDER — OXYCODONE-ACETAMINOPHEN 5-325 MG PO TABS: 1.0000 | ORAL_TABLET | Freq: Four times a day (QID) | ORAL | Status: DC

## 2013-04-15 NOTE — Telephone Encounter (Signed)
[facility] [phone]

## 2013-04-18 ENCOUNTER — Telehealth: Payer: Self-pay | Admitting: Family Medicine

## 2013-04-18 NOTE — Telephone Encounter (Signed)
Please call with orders for: stop the order for R II toes. It is healed, but is still granulating. Wants hydrogel and a foam dressing, as well as order for L buttock - has opened back up. Wanting hydrogel with calcium alginate and cover with foam. Pt also has rash on perineal area. Anything we can do for that? Cream/powder order.

## 2013-04-18 NOTE — Telephone Encounter (Signed)
Go ahead with orders requested.  Difficult to advise for rash without seeing.

## 2013-04-19 NOTE — Telephone Encounter (Signed)
Jo Alvarez is aware per Dr. [name]

## 2013-04-20 MED ORDER — NYSTATIN 100000 UNIT/GM EX CREA: 1.0000 "application " | TOPICAL_CREAM | Freq: Two times a day (BID) | CUTANEOUS | Status: AC | PRN

## 2013-04-20 NOTE — Telephone Encounter (Signed)
Rx sent to pharmacy. Christina the RN is aware and per Dr. [name] if the area is not looking better patient will need to be seen in office

## 2013-04-20 NOTE — Addendum Note (Signed)
Addended by: Thomasena Edis on: 04/20/2013 03:44 PM   Modules accepted: Orders

## 2013-04-20 NOTE — Telephone Encounter (Signed)
Trula Ore is calling to request orders for a specific cream or powder to put over the pt's rash. She states that it's extremely red and she needs to know what to put on it. Please assist.

## 2013-04-25 ENCOUNTER — Encounter: Payer: Self-pay | Admitting: Family Medicine

## 2013-04-25 ENCOUNTER — Ambulatory Visit (INDEPENDENT_AMBULATORY_CARE_PROVIDER_SITE_OTHER): Payer: Medicare Other | Admitting: Family Medicine

## 2013-04-25 ENCOUNTER — Telehealth: Payer: Self-pay | Admitting: Family Medicine

## 2013-04-25 VITALS — BP 132/62 | HR 77 | Temp 98.1°F

## 2013-04-25 DIAGNOSIS — L02619 Cutaneous abscess of unspecified foot: Secondary | ICD-10-CM

## 2013-04-25 DIAGNOSIS — L89322 Pressure ulcer of left buttock, stage 2: Secondary | ICD-10-CM

## 2013-04-25 DIAGNOSIS — L8991 Pressure ulcer of unspecified site, stage 1: Secondary | ICD-10-CM

## 2013-04-25 DIAGNOSIS — L03031 Cellulitis of right toe: Secondary | ICD-10-CM

## 2013-04-25 DIAGNOSIS — L97519 Non-pressure chronic ulcer of other part of right foot with unspecified severity: Secondary | ICD-10-CM

## 2013-04-25 DIAGNOSIS — L97509 Non-pressure chronic ulcer of other part of unspecified foot with unspecified severity: Secondary | ICD-10-CM

## 2013-04-25 DIAGNOSIS — L89309 Pressure ulcer of unspecified buttock, unspecified stage: Secondary | ICD-10-CM

## 2013-04-25 MED ORDER — AMOXICILLIN-POT CLAVULANATE 875-125 MG PO TABS: 1.0000 | ORAL_TABLET | Freq: Two times a day (BID) | ORAL | Status: DC

## 2013-04-25 NOTE — Patient Instructions (Addendum)
Pressure Ulcer A pressure ulcer is a sore that has formed from the break down of skin and exposure of deeper layers of tissue. It develops in areas of the body where there is unrelieved pressure. Pressure ulcers are usually found over a boney area, such as the shoulder blades, spine, lower back, hips, knees, ankles, and heels. RISK FACTORS  Decreased ability to move.  Decreased ability to feel pain or discomfort.  Excessive skin moisture from urine, stool, sweat, or secretions.  Poor nutrition.  Dehydration.  Tobacco, drug, or alcohol abuse.  Pulling sheets that are under a patient when changing his or her position.  Obesity.  Increased adult age.  Age of less than 2 years.  Premature newborns.  Hospitalization in a critical care unit for longer than four days with use of medical devices.  Prolonged use of medical devices.  Critical illness.  Anemia.  Traumatic brain injury.  Spinal cord injury.  Stroke.  Diabetes.  Poor blood glucose control.  Low blood pressure (hypotension).  Low oxygen levels.  Medicines that reduce blood flow.  Infection.  Obesity. STAGING PRESSURE ULCERS Your caregiver may determine the degree of severity (stage) of your pressure ulcer. The stages include:  Stage 1: The skin is red, and when the skin is pressed, it stays red.  Stage 2: The top layer of skin is gone, and there is a shallow, pink ulcer.  Stage 3: The ulcer becomes deeper, and it is more difficult to see the whole wound. Also, there may be yellow or brown parts, as well as pink and red parts.  Stage 4: The ulcer may be deep and red, pink, brown, white, or yellow. Bone or muscle may be seen.  Unstageable pressure ulcer: The ulcer is covered almost completely with black, brown, or yellow tissue. It is not known how deep the ulcer is or what stage it is until this covering comes off.  Suspected deep tissue injury: A patient's skin can be injured from pressure or  pulling on the skin when his or her position is changed. The skin appears purple or maroon. There may not be an opening in the skin, but there could be a blood-filled blister. This deep tissue injury is often difficult to see in people with darker skin tones. The site may open and become deeper in time. However, early interventions will help the area heal and may prevent the area from opening. DIAGNOSIS  Your caregiver will diagnose your pressure ulcer based on its appearance. Your caregiver may determine the stage of your pressure ulcer as well. Your caregiver may request tests to check for infection, assess your circulation, or to check for other diseases, such as diabetes. TREATMENT  Treatment of your pressure ulcer begins with determining what stage the ulcer is in. Your treatment team may include your caregiver, a wound care specialist, a nutritionist, a physical therapist, and a Psychologist, sport and exercise. Treatments include:   Moving or repositioning every 1 2 hours.  Using beds or mattresses to shift your body weight and pressure points frequently.  Improving your diet.  Cleaning and bandaging (dressing) the open wound.  Giving antibiotic medicines.  Removing damaged tissue.  Surgery and sometimes skin grafts. HOME CARE INSTRUCTIONS  Follow the care plan that was started in the hospital.  Avoid staying in the same position for more than 2 hours. Use padding, devices, or mattresses to cushion your pressure points as directed by your caregiver.  Eat well. Take nutritional supplements and vitamins as directed by your  caregiver.  Keep all follow-up appointments.  Take pain medicine as directed by your caregiver. SEEK MEDICAL CARE IF:   Your pressure ulcer is not improving.  You do not know how to care for your pressure ulcer.  You notice other areas of redness on your skin. SEEK IMMEDIATE MEDICAL CARE IF:   You have increasing redness, swelling, or pain in your pressure ulcer.  You notice  pus, or increased pus, coming from your pressure ulcer.  You have a fever.  You notice a bad smell coming from the wound or dressing.  Your pressure ulcer opens up again. MAKE SURE YOU:   Understand these instructions.  Will watch your condition.  Will get help right away if you are not doing well or get worse. Document Released: 08/18/2005 Document Revised: 08/04/2012 Document Reviewed: 04/04/2011 [facility] Patient Information 2014 [place], [place].  Get X-ray of right foot tomorrow.

## 2013-04-25 NOTE — Telephone Encounter (Signed)
Dawn from [facility] would like a written order for pt to have nystatin cream twice a day not prn. Please give order to pt son to bring back to facility. Pt already have the cream

## 2013-04-25 NOTE — Progress Notes (Addendum)
Subjective:    Patient ID: Jo Alvarez, female    DOB: [birth year], 79 y.o.   MRN: [id number]  HPI Patient is seen with concerns for right second toe ulcer and left buttock ulcer She has multiple chronic problems including remote history of temporal arteritis, chronic hearing loss, peripheral neuropathy, history of ischemic colitis, hypertension, chronic intermittent diarrhea, recurrent major depression, osteoarthritis. She's grown very debilitated in recent years. She is basically wheelchair bound. She currently is inpatient facility with intermediate care. Family realizes that she likely needs skilled nursing now.  She has left buttock ulcer which has been treated with various topical agents. Her situation is complicated by some urine incontinence and the fact she does not ambulate and unable to shift her weight  frequently.  Second issue is approximately 4-5 week history of right second toe ulceration. She was on Keflex about 3 weeks ago the family states that her redness of the second toe improved. She now has draining ulcer involving the interphalangeal joint of the right second toe. No reported fever or chills. She does not have history of diabetes. No history of arterial insufficiency. Nonsmoker  Past Medical History  Diagnosis Date  . HYPONATREMIA 01/01/2009  . ANEMIA 08/20/2009  . LEUKOCYTOSIS 08/20/2009  . DEPRESSION, MAJOR, RECURRENT 02/01/2009  . Unspecified hearing loss 11/17/2008  . HYPERTENSION 11/17/2008  . TEMPORAL ARTERITIS 08/20/2009  . PYELONEPHRITIS 09/10/2009  . Cellulitis and abscess of upper arm and forearm 04/16/2010  . PRESSURE ULCER BUTTOCK 02/01/2009  . BACK PAIN, LUMBAR 07/29/2010  . WEAKNESS 01/01/2009  . Anorexia 12/19/2008  . Headache(784.0) 04/18/2010  . DIARRHEA, CHRONIC 12/19/2008  . Stress incontinence   . Hx: UTI (urinary tract infection)   . Blood transfusion   . Foot drop, bilateral   . Clostridium difficile colitis     History of  . Pancreatitis    History of  . Anxiety   . DEGENERATIVE JOINT DISEASE 11/17/2008    shoulders, arms  . History of skin cancer   . Dysrhythmia     Hx of paroxysmal a-fib  . H/O psoriatic arthritis   . History of SIADH   . H/O hypokalemia   . H/O mitral valve prolapse   . 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 Cholecystectomy    . Abdominal hysterectomy    . Lumbar laminectomy    . Temporal artery biopsy / ligation  06/04/2009  . Right cataract extraction      reports that she has never smoked. She has never used smokeless tobacco. She reports that she does not drink alcohol or use illicit drugs. family history is negative for Cancer, Hypertension, and Arthritis. Allergies  Allergen Reactions  . Benazepril Hcl     REACTION: cough  . Epinephrine Hcl     REACTION: shakes  . Lidocaine Other (See Comments)    Per MAR       Review of Systems  Constitutional: Negative for fever and chills.  Respiratory: Negative for cough and shortness of breath.   Cardiovascular: Negative for chest pain.  Gastrointestinal: Negative for abdominal pain.  Psychiatric/Behavioral: Negative for confusion.       Objective:   Physical Exam  Constitutional: She appears well-developed and well-nourished.  Neck: Neck supple.  Cardiovascular: Normal rate and regular rhythm.  Exam reveals no gallop.   Pulmonary/Chest: Effort normal and breath sounds normal. No respiratory distress. She has no wheezes. She has no  rales.  Musculoskeletal:  Right second toe reveals some diffuse erythema and warmth. She has a very small ulcer or over the interphalangeal joint. There is some yellowish exudative material in the center. No evidence for necrosis of the skin. Good capillary refill. She has erythema extending onto the dorsum of the right foot. Minimal warmth. 1+ dorsalis pedis pulse.  Skin:  Left buttock refills approximately 1 cm stage II  ulceration. She has a surrounding area about 5 cm of some very mild erythema. No evidence for infection          Assessment & Plan:  #1 cellulitis right second toe/foot with open ulcer. Concern is whether she may have osteomyelitis in addition her cellulitis. Wound culture obtained. Start Augmentin 875 mg twice daily. Start with plain x-rays. May need MRI to further assess. May need to consider hospitalization for further treatment pending x-ray results #2 stage II left buttock ulcer. Increased risk factors include immobility, poor nutritional status, incontinence. Ulcer was cleaned. Tegaderm applied.  X-ray no evidence for osteomyelitis.  Will set up referral to wound care center.

## 2013-04-26 ENCOUNTER — Ambulatory Visit (INDEPENDENT_AMBULATORY_CARE_PROVIDER_SITE_OTHER)
Admission: RE | Admit: 2013-04-26 | Discharge: 2013-04-26 | Disposition: A | Discharge status: Home / Self Care | Payer: Medicare Other | Source: Ambulatory Visit | Attending: Family Medicine | Admitting: Family Medicine

## 2013-04-26 DIAGNOSIS — L03031 Cellulitis of right toe: Secondary | ICD-10-CM

## 2013-04-26 DIAGNOSIS — L02619 Cutaneous abscess of unspecified foot: Secondary | ICD-10-CM

## 2013-04-26 NOTE — Telephone Encounter (Signed)
Order was given to pt son

## 2013-04-26 NOTE — Addendum Note (Signed)
Addended by: Kristian Covey on: 04/26/2013 02:39 PM   Modules accepted: Orders

## 2013-04-29 ENCOUNTER — Other Ambulatory Visit: Payer: Self-pay

## 2013-04-29 LAB — WOUND CULTURE

## 2013-04-29 MED ORDER — DOXYCYCLINE HYCLATE 100 MG PO CAPS: 100.0000 mg | ORAL_CAPSULE | Freq: Two times a day (BID) | ORAL | Status: DC

## 2013-05-03 ENCOUNTER — Telehealth: Payer: Self-pay | Admitting: Family Medicine

## 2013-05-03 NOTE — Telephone Encounter (Signed)
It was found yesterday from home health ([facility]) that pt has MRSA in her wound.  Do we have any record or conformation of this? Pls fax to [facility]. Fax [phone]   Attn Boneta Lucks

## 2013-05-04 ENCOUNTER — Encounter (HOSPITAL_BASED_OUTPATIENT_CLINIC_OR_DEPARTMENT_OTHER): Payer: Medicare Other | Attending: General Surgery

## 2013-05-04 DIAGNOSIS — M869 Osteomyelitis, unspecified: Secondary | ICD-10-CM | POA: Insufficient documentation

## 2013-05-04 DIAGNOSIS — M129 Arthropathy, unspecified: Secondary | ICD-10-CM | POA: Insufficient documentation

## 2013-05-04 DIAGNOSIS — IMO0002 Reserved for concepts with insufficient information to code with codable children: Secondary | ICD-10-CM | POA: Insufficient documentation

## 2013-05-04 DIAGNOSIS — Z993 Dependence on wheelchair: Secondary | ICD-10-CM | POA: Insufficient documentation

## 2013-05-04 DIAGNOSIS — L97509 Non-pressure chronic ulcer of other part of unspecified foot with unspecified severity: Secondary | ICD-10-CM | POA: Insufficient documentation

## 2013-05-04 DIAGNOSIS — Z79899 Other long term (current) drug therapy: Secondary | ICD-10-CM | POA: Insufficient documentation

## 2013-05-04 DIAGNOSIS — I10 Essential (primary) hypertension: Secondary | ICD-10-CM | POA: Insufficient documentation

## 2013-05-04 DIAGNOSIS — Z96659 Presence of unspecified artificial knee joint: Secondary | ICD-10-CM | POA: Insufficient documentation

## 2013-05-04 DIAGNOSIS — M216X9 Other acquired deformities of unspecified foot: Secondary | ICD-10-CM | POA: Insufficient documentation

## 2013-05-04 DIAGNOSIS — M6281 Muscle weakness (generalized): Secondary | ICD-10-CM | POA: Insufficient documentation

## 2013-05-04 DIAGNOSIS — G5790 Unspecified mononeuropathy of unspecified lower limb: Secondary | ICD-10-CM | POA: Insufficient documentation

## 2013-05-04 DIAGNOSIS — Z9089 Acquired absence of other organs: Secondary | ICD-10-CM | POA: Insufficient documentation

## 2013-05-04 NOTE — Telephone Encounter (Signed)
Faxed over lab results

## 2013-05-05 ENCOUNTER — Other Ambulatory Visit: Payer: Self-pay | Admitting: Orthopaedic Surgery

## 2013-05-05 DIAGNOSIS — M79671 Pain in right foot: Secondary | ICD-10-CM

## 2013-05-05 DIAGNOSIS — M869 Osteomyelitis, unspecified: Secondary | ICD-10-CM

## 2013-05-05 DIAGNOSIS — R609 Edema, unspecified: Secondary | ICD-10-CM

## 2013-05-05 NOTE — Progress Notes (Signed)
[facility]  NAME:  Jo Alvarez, Jo Alvarez               ACCOUNT NO.:  [id number]  MEDICAL RECORD NO.:  [id number]      DATE OF BIRTH:  July 22, 1925  PHYSICIAN:  Ardath Sax, M.D.      VISIT DATE:  05/04/2013                                  OFFICE VISIT   SUBJECTIVE:  This is an 79 year old lady who was brought here by her son.  She is limited to wheelchair ambulation because of neuropathy. She apparently has had this for years which she has developed a lot of footdrop and muscle weakness.  She also has a history of hypertension, and what the son says is peripheral vascular disease.  She has had several operations including cholecystectomy, hysterectomy, right knee replacement, and apparently lumbar fusions for disk disease.  She was noted to have a blood pressure 120/80, pulse 70, respirations 16.  She weighs 123 pounds.  She is a little hard of hearing but otherwise is very alert.  She does take Norvasc and Catapres for hypertension.  She has been placed on doxycycline because of the ulcer on her right second toe which is the reason she has been sent here.  She is also on Toprol and gabapentin, and unfortunately she is also on prednisone because of her arthritis.  On examination, she has an open wound on the dorsal aspect of her right second toe over the proximal interphalangeal joint, and you can look right down into the joint and there is exposed bone. The ulcer is about 6 or 7 mm in diameter, and really you can literally look right into the joint.  I felt it was useless for [place] to try to treat this as I do not think there is any chance of curing it, so we went and talked to them about an orthopedic consult and they already have an Orthopod in there family that has treated them and so we referred the patient there.  DIAGNOSES: 1. Osteomyelitis with erosion of the neuropathic ulcer of her right     second toe, right down into the proximal interphalangeal joint. 2.  Hypertension.     Ardath Sax, M.D.     PP/MEDQ  D:  05/04/2013  T:  05/04/2013  Job:  [id number]

## 2013-05-09 ENCOUNTER — Ambulatory Visit
Admission: RE | Admit: 2013-05-09 | Discharge: 2013-05-09 | Disposition: A | Discharge status: Home / Self Care | Payer: Medicare Other | Source: Ambulatory Visit | Attending: Orthopaedic Surgery | Admitting: Orthopaedic Surgery

## 2013-05-09 ENCOUNTER — Encounter (INDEPENDENT_AMBULATORY_CARE_PROVIDER_SITE_OTHER): Payer: Medicare Other | Admitting: *Deleted

## 2013-05-09 ENCOUNTER — Encounter: Payer: Self-pay | Admitting: Orthopaedic Surgery

## 2013-05-09 DIAGNOSIS — M79671 Pain in right foot: Secondary | ICD-10-CM

## 2013-05-09 DIAGNOSIS — M869 Osteomyelitis, unspecified: Secondary | ICD-10-CM

## 2013-05-09 DIAGNOSIS — I739 Peripheral vascular disease, unspecified: Secondary | ICD-10-CM

## 2013-05-09 DIAGNOSIS — L98499 Non-pressure chronic ulcer of skin of other sites with unspecified severity: Secondary | ICD-10-CM

## 2013-05-09 DIAGNOSIS — R609 Edema, unspecified: Secondary | ICD-10-CM

## 2013-05-09 MED ORDER — GADOBENATE DIMEGLUMINE 529 MG/ML IV SOLN
6.0000 mL | Freq: Once | INTRAVENOUS | Status: AC | PRN
  Administered 2013-05-09: 6 mL via INTRAVENOUS

## 2013-05-12 ENCOUNTER — Other Ambulatory Visit (HOSPITAL_COMMUNITY): Payer: Self-pay | Admitting: Orthopedic Surgery

## 2013-05-12 ENCOUNTER — Telehealth: Payer: Self-pay | Admitting: Family Medicine

## 2013-05-12 ENCOUNTER — Encounter (HOSPITAL_COMMUNITY): Payer: Self-pay | Admitting: Orthopedic Surgery

## 2013-05-12 MED ORDER — CEFAZOLIN SODIUM-DEXTROSE 2-3 GM-% IV SOLR
2.0000 g | INTRAVENOUS | Status: DC
Start: 2013-05-13 — End: 2013-05-13
  Filled 2013-05-12: qty 50

## 2013-05-12 NOTE — Telephone Encounter (Signed)
RN from [facility]. They have been seeing her for nursing wound care. Jo Alvarez recently had an MRI that toe. Based on those results, home care needs to increase monitoring visits, as nurse states ultimate plan is to amputate the toe next month. Requesting orders for care 3x/wk for 4 wks. Please call Britta Mccreedy with verbal order.

## 2013-05-12 NOTE — Progress Notes (Signed)
Form faxed to [facility] which meds to take in the am dos.  Also spoke with pt son and history updated.

## 2013-05-13 ENCOUNTER — Encounter (HOSPITAL_COMMUNITY)
Admission: RE | Disposition: A | Discharge status: Skilled Nursing Facility | Payer: Self-pay | Source: Ambulatory Visit | Attending: Orthopedic Surgery

## 2013-05-13 ENCOUNTER — Encounter (HOSPITAL_COMMUNITY): Payer: Self-pay | Admitting: *Deleted

## 2013-05-13 ENCOUNTER — Inpatient Hospital Stay (HOSPITAL_COMMUNITY): Payer: Medicare Other | Admitting: Anesthesiology

## 2013-05-13 ENCOUNTER — Encounter (HOSPITAL_COMMUNITY): Payer: Self-pay | Admitting: Anesthesiology

## 2013-05-13 ENCOUNTER — Inpatient Hospital Stay (HOSPITAL_COMMUNITY)
Admission: RE | Admit: 2013-05-13 | Discharge: 2013-05-16 | DRG: 256 | Disposition: A | Discharge status: Skilled Nursing Facility | Payer: Medicare Other | Source: Ambulatory Visit | Attending: Orthopedic Surgery | Admitting: Orthopedic Surgery

## 2013-05-13 DIAGNOSIS — I059 Rheumatic mitral valve disease, unspecified: Secondary | ICD-10-CM | POA: Diagnosis present

## 2013-05-13 DIAGNOSIS — L97509 Non-pressure chronic ulcer of other part of unspecified foot with unspecified severity: Secondary | ICD-10-CM | POA: Diagnosis present

## 2013-05-13 DIAGNOSIS — E1159 Type 2 diabetes mellitus with other circulatory complications: Principal | ICD-10-CM | POA: Diagnosis present

## 2013-05-13 DIAGNOSIS — M869 Osteomyelitis, unspecified: Secondary | ICD-10-CM | POA: Diagnosis present

## 2013-05-13 DIAGNOSIS — G6189 Other inflammatory polyneuropathies: Secondary | ICD-10-CM | POA: Diagnosis present

## 2013-05-13 DIAGNOSIS — Z96649 Presence of unspecified artificial hip joint: Secondary | ICD-10-CM

## 2013-05-13 DIAGNOSIS — Z96659 Presence of unspecified artificial knee joint: Secondary | ICD-10-CM

## 2013-05-13 DIAGNOSIS — K219 Gastro-esophageal reflux disease without esophagitis: Secondary | ICD-10-CM | POA: Diagnosis present

## 2013-05-13 DIAGNOSIS — I96 Gangrene, not elsewhere classified: Secondary | ICD-10-CM | POA: Diagnosis present

## 2013-05-13 DIAGNOSIS — I798 Other disorders of arteries, arterioles and capillaries in diseases classified elsewhere: Secondary | ICD-10-CM | POA: Diagnosis present

## 2013-05-13 DIAGNOSIS — E876 Hypokalemia: Secondary | ICD-10-CM | POA: Diagnosis present

## 2013-05-13 DIAGNOSIS — Z85828 Personal history of other malignant neoplasm of skin: Secondary | ICD-10-CM

## 2013-05-13 DIAGNOSIS — F339 Major depressive disorder, recurrent, unspecified: Secondary | ICD-10-CM | POA: Diagnosis present

## 2013-05-13 DIAGNOSIS — F411 Generalized anxiety disorder: Secondary | ICD-10-CM | POA: Diagnosis present

## 2013-05-13 DIAGNOSIS — I10 Essential (primary) hypertension: Secondary | ICD-10-CM | POA: Diagnosis present

## 2013-05-13 DIAGNOSIS — H9190 Unspecified hearing loss, unspecified ear: Secondary | ICD-10-CM | POA: Diagnosis present

## 2013-05-13 HISTORY — DX: Nonrheumatic mitral (valve) prolapse: I34.1

## 2013-05-13 HISTORY — PX: AMPUTATION: SHX166

## 2013-05-13 HISTORY — DX: Gastro-esophageal reflux disease without esophagitis: K21.9

## 2013-05-13 LAB — CBC
HCT: 37 % (ref 36.0–46.0)
Hemoglobin: 12.3 g/dL (ref 12.0–15.0)
MCV: 94.4 fL (ref 78.0–100.0)
RDW: 13.9 % (ref 11.5–15.5)
WBC: 11.6 10*3/uL — ABNORMAL HIGH (ref 4.0–10.5)

## 2013-05-13 LAB — PROTIME-INR
INR: 1.04 (ref 0.00–1.49)
Prothrombin Time: 13.4 seconds (ref 11.6–15.2)

## 2013-05-13 LAB — COMPREHENSIVE METABOLIC PANEL
Albumin: 3.6 g/dL (ref 3.5–5.2)
BUN: 15 mg/dL (ref 6–23)
CO2: 27 mEq/L (ref 19–32)
Chloride: 97 mEq/L (ref 96–112)
Creatinine, Ser: 0.58 mg/dL (ref 0.50–1.10)
GFR calc Af Amer: >90 mL/min (ref >90)
GFR calc non Af Amer: 81 mL/min — ABNORMAL LOW (ref >90)
Glucose, Bld: 111 mg/dL — ABNORMAL HIGH (ref 70–99)
Total Bilirubin: 0.3 mg/dL (ref 0.3–1.2)

## 2013-05-13 SURGERY — AMPUTATION, FOOT, RAY
Anesthesia: Monitor Anesthesia Care | Site: Foot | Laterality: Right | Wound class: Dirty or Infected

## 2013-05-13 MED ORDER — ASPIRIN EC 325 MG PO TBEC
325.0000 mg | DELAYED_RELEASE_TABLET | Freq: Every day | ORAL | Status: DC
  Administered 2013-05-14 – 2013-05-16 (×3): 325 mg via ORAL
  Filled 2013-05-13 (×3): qty 1

## 2013-05-13 MED ORDER — METOCLOPRAMIDE HCL 5 MG/ML IJ SOLN: 5.0000 mg | Freq: Three times a day (TID) | INTRAMUSCULAR | Status: DC | PRN

## 2013-05-13 MED ORDER — HYDROMORPHONE HCL PF 1 MG/ML IJ SOLN: 0.5000 mg | INTRAMUSCULAR | Status: DC | PRN

## 2013-05-13 MED ORDER — GABAPENTIN 300 MG PO CAPS
300.0000 mg | ORAL_CAPSULE | Freq: Two times a day (BID) | ORAL | Status: DC
  Administered 2013-05-14 – 2013-05-16 (×6): 300 mg via ORAL
  Filled 2013-05-13 (×7): qty 1

## 2013-05-13 MED ORDER — CLONIDINE HCL 0.1 MG PO TABS
0.1000 mg | ORAL_TABLET | Freq: Every day | ORAL | Status: DC
  Administered 2013-05-14 – 2013-05-16 (×3): 0.1 mg via ORAL
  Filled 2013-05-13 (×3): qty 1

## 2013-05-13 MED ORDER — METOPROLOL SUCCINATE 12.5 MG HALF TABLET
12.5000 mg | ORAL_TABLET | Freq: Every day | ORAL | Status: DC
  Administered 2013-05-14 – 2013-05-16 (×3): 12.5 mg via ORAL
  Filled 2013-05-13 (×3): qty 1

## 2013-05-13 MED ORDER — LIDOCAINE HCL (CARDIAC) 20 MG/ML IV SOLN
INTRAVENOUS | Status: DC | PRN
  Administered 2013-05-13: 40 mg via INTRAVENOUS

## 2013-05-13 MED ORDER — CEFAZOLIN SODIUM-DEXTROSE 2-3 GM-% IV SOLR
INTRAVENOUS | Status: AC
  Filled 2013-05-13: qty 50

## 2013-05-13 MED ORDER — BUPIVACAINE HCL (PF) 0.5 % IJ SOLN
INTRAMUSCULAR | Status: DC | PRN
  Administered 2013-05-13: 35 mL

## 2013-05-13 MED ORDER — PREDNISONE 10 MG PO TABS
10.0000 mg | ORAL_TABLET | Freq: Every day | ORAL | Status: DC
  Administered 2013-05-14 – 2013-05-16 (×3): 10 mg via ORAL
  Filled 2013-05-13 (×3): qty 1

## 2013-05-13 MED ORDER — LACTATED RINGERS IV SOLN
INTRAVENOUS | Status: DC
  Administered 2013-05-13: 14:00:00 via INTRAVENOUS

## 2013-05-13 MED ORDER — LEFLUNOMIDE 10 MG PO TABS
15.0000 mg | ORAL_TABLET | Freq: Every day | ORAL | Status: DC
  Administered 2013-05-14 – 2013-05-16 (×3): 15 mg via ORAL
  Filled 2013-05-13 (×3): qty 1.5

## 2013-05-13 MED ORDER — POLYETHYLENE GLYCOL 3350 17 G PO PACK
17.0000 g | PACK | Freq: Every day | ORAL | Status: DC
  Administered 2013-05-16: 17 g via ORAL
  Filled 2013-05-13 (×3): qty 1

## 2013-05-13 MED ORDER — OXYCODONE-ACETAMINOPHEN 5-325 MG PO TABS
1.0000 | ORAL_TABLET | ORAL | Status: DC | PRN
  Administered 2013-05-14 – 2013-05-15 (×4): 1 via ORAL
  Filled 2013-05-13 (×3): qty 1
  Filled 2013-05-13: qty 2

## 2013-05-13 MED ORDER — SODIUM CHLORIDE 0.9 % IV SOLN
INTRAVENOUS | Status: DC
  Administered 2013-05-14: 06:00:00 via INTRAVENOUS

## 2013-05-13 MED ORDER — DROPERIDOL 2.5 MG/ML IJ SOLN: 0.6250 mg | INTRAMUSCULAR | Status: DC | PRN

## 2013-05-13 MED ORDER — CEFAZOLIN SODIUM-DEXTROSE 2-3 GM-% IV SOLR
INTRAVENOUS | Status: DC | PRN
  Administered 2013-05-13: 2 g via INTRAVENOUS

## 2013-05-13 MED ORDER — HYDROCODONE-ACETAMINOPHEN 5-325 MG PO TABS
1.0000 | ORAL_TABLET | ORAL | Status: DC | PRN
  Administered 2013-05-14 – 2013-05-16 (×2): 1 via ORAL
  Filled 2013-05-13 (×2): qty 1

## 2013-05-13 MED ORDER — FENTANYL CITRATE 0.05 MG/ML IJ SOLN
50.0000 ug | INTRAMUSCULAR | Status: DC | PRN
  Administered 2013-05-13: 50 ug via INTRAVENOUS

## 2013-05-13 MED ORDER — CEFAZOLIN SODIUM 1-5 GM-% IV SOLN
1.0000 g | Freq: Four times a day (QID) | INTRAVENOUS | Status: AC
  Administered 2013-05-14 (×3): 1 g via INTRAVENOUS
  Filled 2013-05-13 (×3): qty 50

## 2013-05-13 MED ORDER — FENTANYL CITRATE 0.05 MG/ML IJ SOLN
INTRAMUSCULAR | Status: DC | PRN
  Administered 2013-05-13: 50 ug via INTRAVENOUS

## 2013-05-13 MED ORDER — PROPOFOL 10 MG/ML IV BOLUS
INTRAVENOUS | Status: DC | PRN
  Administered 2013-05-13: 20 mg via INTRAVENOUS

## 2013-05-13 MED ORDER — CEFAZOLIN SODIUM-DEXTROSE 2-3 GM-% IV SOLR: 2.0000 g | INTRAVENOUS | Status: DC

## 2013-05-13 MED ORDER — FENTANYL CITRATE 0.05 MG/ML IJ SOLN
INTRAMUSCULAR | Status: AC
  Administered 2013-05-13: 50 ug via INTRAVENOUS
  Filled 2013-05-13: qty 2

## 2013-05-13 MED ORDER — DIAZEPAM 5 MG PO TABS
5.0000 mg | ORAL_TABLET | Freq: Two times a day (BID) | ORAL | Status: DC | PRN
  Administered 2013-05-14 – 2013-05-15 (×4): 5 mg via ORAL
  Filled 2013-05-13 (×4): qty 1

## 2013-05-13 MED ORDER — AMLODIPINE BESYLATE 10 MG PO TABS
10.0000 mg | ORAL_TABLET | Freq: Every day | ORAL | Status: DC
  Administered 2013-05-14 – 2013-05-16 (×3): 10 mg via ORAL
  Filled 2013-05-13 (×3): qty 1

## 2013-05-13 MED ORDER — ONDANSETRON HCL 4 MG/2ML IJ SOLN: 4.0000 mg | Freq: Four times a day (QID) | INTRAMUSCULAR | Status: DC | PRN

## 2013-05-13 MED ORDER — PROMETHAZINE HCL 25 MG PO TABS: 25.0000 mg | ORAL_TABLET | Freq: Four times a day (QID) | ORAL | Status: DC | PRN

## 2013-05-13 MED ORDER — FENTANYL CITRATE 0.05 MG/ML IJ SOLN
INTRAMUSCULAR | Status: AC
  Filled 2013-05-13: qty 2

## 2013-05-13 MED ORDER — ONDANSETRON HCL 4 MG PO TABS: 4.0000 mg | ORAL_TABLET | Freq: Four times a day (QID) | ORAL | Status: DC | PRN

## 2013-05-13 MED ORDER — FENTANYL CITRATE 0.05 MG/ML IJ SOLN: 25.0000 ug | INTRAMUSCULAR | Status: DC | PRN

## 2013-05-13 MED ORDER — METOCLOPRAMIDE HCL 10 MG PO TABS: 5.0000 mg | ORAL_TABLET | Freq: Three times a day (TID) | ORAL | Status: DC | PRN

## 2013-05-13 MED ORDER — FAMOTIDINE 10 MG PO TABS
10.0000 mg | ORAL_TABLET | Freq: Every day | ORAL | Status: DC
  Administered 2013-05-14 – 2013-05-16 (×3): 10 mg via ORAL
  Filled 2013-05-13 (×3): qty 1

## 2013-05-13 MED ORDER — 0.9 % SODIUM CHLORIDE (POUR BTL) OPTIME
TOPICAL | Status: DC | PRN
  Administered 2013-05-13: 1000 mL

## 2013-05-13 SURGICAL SUPPLY — 44 items
BANDAGE ESMARK 6X9 LF (GAUZE/BANDAGES/DRESSINGS) IMPLANT
BANDAGE GAUZE ELAST BULKY 4 IN (GAUZE/BANDAGES/DRESSINGS) ×2 IMPLANT
BLADE SAW SGTL MED 73X18.5 STR (BLADE) IMPLANT
BLADE SURG 21 STRL SS (BLADE) ×2 IMPLANT
BNDG COHESIVE 4X5 TAN STRL (GAUZE/BANDAGES/DRESSINGS) ×2 IMPLANT
BNDG COHESIVE 6X5 TAN STRL LF (GAUZE/BANDAGES/DRESSINGS) IMPLANT
BNDG ESMARK 6X9 LF (GAUZE/BANDAGES/DRESSINGS)
CLOTH BEACON ORANGE TIMEOUT ST (SAFETY) IMPLANT
CUFF TOURNIQUET SINGLE 34IN LL (TOURNIQUET CUFF) IMPLANT
CUFF TOURNIQUET SINGLE 44IN (TOURNIQUET CUFF) IMPLANT
DRAPE U-SHAPE 47X51 STRL (DRAPES) ×2 IMPLANT
DRSG ADAPTIC 3X8 NADH LF (GAUZE/BANDAGES/DRESSINGS) ×2 IMPLANT
DRSG PAD ABDOMINAL 8X10 ST (GAUZE/BANDAGES/DRESSINGS) ×2 IMPLANT
DURAPREP 26ML APPLICATOR (WOUND CARE) ×2 IMPLANT
ELECT REM PT RETURN 9FT ADLT (ELECTROSURGICAL) ×2
ELECTRODE REM PT RTRN 9FT ADLT (ELECTROSURGICAL) ×1 IMPLANT
GLOVE BIOGEL PI IND STRL 7.5 (GLOVE) ×1 IMPLANT
GLOVE BIOGEL PI IND STRL 9 (GLOVE) ×1 IMPLANT
GLOVE BIOGEL PI INDICATOR 7.5 (GLOVE) ×1
GLOVE BIOGEL PI INDICATOR 9 (GLOVE) ×1
GLOVE SURG ORTHO 9.0 STRL STRW (GLOVE) ×2 IMPLANT
GLOVE SURG SS PI 7.5 STRL IVOR (GLOVE) ×2 IMPLANT
GOWN PREVENTION PLUS XLARGE (GOWN DISPOSABLE) ×4 IMPLANT
GOWN SRG XL XLNG 56XLVL 4 (GOWN DISPOSABLE) ×2 IMPLANT
GOWN STRL NON-REIN XL XLG LVL4 (GOWN DISPOSABLE) ×2
KIT BASIN OR (CUSTOM PROCEDURE TRAY) ×2 IMPLANT
KIT ROOM TURNOVER OR (KITS) ×2 IMPLANT
MANIFOLD NEPTUNE II (INSTRUMENTS) ×2 IMPLANT
NS IRRIG 1000ML POUR BTL (IV SOLUTION) ×2 IMPLANT
PACK ORTHO EXTREMITY (CUSTOM PROCEDURE TRAY) ×2 IMPLANT
PAD ARMBOARD 7.5X6 YLW CONV (MISCELLANEOUS) ×2 IMPLANT
PAD CAST 4YDX4 CTTN HI CHSV (CAST SUPPLIES) IMPLANT
PADDING CAST COTTON 4X4 STRL (CAST SUPPLIES)
SPONGE GAUZE 4X4 12PLY (GAUZE/BANDAGES/DRESSINGS) ×2 IMPLANT
SPONGE LAP 18X18 X RAY DECT (DISPOSABLE) ×2 IMPLANT
STAPLER VISISTAT 35W (STAPLE) IMPLANT
STOCKINETTE IMPERVIOUS LG (DRAPES) IMPLANT
SUCTION FRAZIER TIP 10 FR DISP (SUCTIONS) IMPLANT
SUT ETHILON 2 0 PSLX (SUTURE) ×2 IMPLANT
TOWEL OR 17X24 6PK STRL BLUE (TOWEL DISPOSABLE) ×2 IMPLANT
TOWEL OR 17X26 10 PK STRL BLUE (TOWEL DISPOSABLE) ×2 IMPLANT
TUBE CONNECTING 12X1/4 (SUCTIONS) IMPLANT
UNDERPAD 30X30 INCONTINENT (UNDERPADS AND DIAPERS) ×2 IMPLANT
WATER STERILE IRR 1000ML POUR (IV SOLUTION) IMPLANT

## 2013-05-13 NOTE — Op Note (Signed)
OPERATIVE REPORT  DATE OF SURGERY: 05/13/2013  PATIENT:  Jo Alvarez,  79 y.o. female  PRE-OPERATIVE DIAGNOSIS:  Osteomyelitis Right Foot 2nd Toe  POST-OPERATIVE DIAGNOSIS:  Osteomyelitis ulceration of right second toe  PROCEDURE:  Proced ure(s): AMPUTATION RAY right foot second ray  SURGEON:  Surgeon(s): Nadara Mustard, MD  ANESTHESIA:   regional  EBL:  Minimal ML  SPECIMEN:  No Specimen  TOURNIQUET:  * No tourniquets in log *  PROCEDURE DETAILS: Patient is an 79 year old woman with peripheral vascular disease with a gangrenous ulcer right foot second toe with exposed bone and osteomyelitis. Patient has calcified vessels her ankle-brachial indices are proximally 60% and she presents at this time for amputation of the second ray. Risks and benefits were discussed with the patient including risk of nonhealing risk for additional surgery risk of a higher level amputation. Patient states she understands and wished to proceed at this time. Description of procedure patient was brought to the operating room and underwent an ankle block. After adequate levels and anesthesia were obtained patient's right lower extremity was prepped using DuraPrep and draped into a sterile field. A racquet incision was made around the second ray the second metatarsal was resected through the mid shaft of the metatarsal. The wound is irrigated with normal saline hemostasis was obtained there was good petechial bleeding. The incision was closed using 2-0 nylon. The wound was covered with Adaptic orthopedic sponges AB dressing Kerlix and Coban.  PLAN OF CARE: Admit for overnight observation  PATIENT DISPOSITION:  PACU - hemodynamically stable.   Nadara Mustard, MD 05/13/2013 10:11 PM

## 2013-05-13 NOTE — Telephone Encounter (Signed)
OK to give verbal order as requested.

## 2013-05-13 NOTE — Telephone Encounter (Signed)
Informed Jo Alvarez per Dr. [name]

## 2013-05-13 NOTE — Anesthesia Procedure Notes (Signed)
Anesthesia Regional Block:  Ankle block  Pre-Anesthetic Checklist: ,, timeout performed, Correct Patient, Correct Site, Correct Laterality, Correct Procedure, Correct Position, site marked, Risks and benefits discussed,  Surgical consent,  Pre-op evaluation,  At surgeon's request and post-op pain management  Laterality: Right  Prep: chloraprep       Needles:  Injection technique: Single-shot  Needle Type: Quincke      Needle Gauge: 25 and 25 G    Additional Needles:  Procedures: other  (perineural infiltration) Ankle block Narrative:  Start time: 05/13/2013 5:45 PM End time: 05/13/2013 5:57 PM Injection made incrementally with aspirations every 5 mL.  Performed by: Personally  Anesthesiologist: Sandford Craze, MD  Additional Notes: Pt identified in Holding room.  Monitors applied. Working IV access confirmed. Sterile prep R ankle.  #25ga perineural infiltration of local around deep and sup peroneal, saph, post tib and sural nerves.  Total 35cc 0.5% Bupivacaine injected incrementally.  Patient asymptomatic, VSS, no heme aspirated, tolerated well.  Sandford Craze, MD

## 2013-05-13 NOTE — Progress Notes (Signed)
During block patient on continuous monitor. NSR rate 65, SpO2 99% 2 lpm RR 12- 14

## 2013-05-13 NOTE — Transfer of Care (Signed)
Immediate Anesthesia Transfer of Care Note  Patient: Jo Alvarez  Procedure(s) Performed: Procedure(s) with comments: AMPUTATION RAY (Right) - Right Foot 2nd Ray Amputation  Patient Location: PACU  Anesthesia Type:MAC and Regional  Level of Consciousness: awake, alert , patient cooperative and confused  Airway & Oxygen Therapy: Patient Spontanous Breathing  Post-op Assessment: Report given to PACU RN and Post -op Vital signs reviewed and stable  Post vital signs: Reviewed and stable  Complications: No apparent anesthesia complications

## 2013-05-13 NOTE — Progress Notes (Signed)
Patient resting.

## 2013-05-13 NOTE — Preoperative (Signed)
Beta Blockers   Reason not to administer Beta Blockers:Not Applicable 

## 2013-05-13 NOTE — Anesthesia Postprocedure Evaluation (Signed)
  Anesthesia Post-op Note  Patient: Jo Alvarez  Procedure(s) Performed: Procedure(s) with comments: AMPUTATION RAY (Right) - Right Foot 2nd Ray Amputation  Patient Location: PACU  Anesthesia Type:Regional  Level of Consciousness: awake, alert , oriented and patient cooperative  Airway and Oxygen Therapy: Patient Spontanous Breathing  Post-op Pain: none  Post-op Assessment: Post-op Vital signs reviewed, Patient's Cardiovascular Status Stable, Respiratory Function Stable, Patent Airway, No signs of Nausea or vomiting and Pain level controlled  Post-op Vital Signs: stable  Complications: No apparent anesthesia complications

## 2013-05-13 NOTE — Telephone Encounter (Signed)
Britta Mccreedy calling back to inquire about this request. Please assist.

## 2013-05-13 NOTE — Progress Notes (Signed)
Report given to Tonya, RN.

## 2013-05-13 NOTE — Anesthesia Preprocedure Evaluation (Addendum)
Anesthesia Evaluation  Patient identified by MRN, date of birth, ID band Patient awake    Reviewed: Allergy & Precautions, H&P , NPO status , Patient's Chart, lab work & pertinent test results  History of Anesthesia Complications Negative for: history of anesthetic complications  Airway Mallampati: II TM Distance: >3 FB Neck ROM: Full    Dental  (+) Caps and Dental Advisory Given   Pulmonary  breath sounds clear to auscultation  Pulmonary exam normal       Cardiovascular hypertension, Pt. on medications and Pt. on home beta blockers + Peripheral Vascular Disease + Valvular Problems/Murmurs MVP Rhythm:Regular Rate:Normal     Neuro/Psych Memory and cognitive deficits: early dementiaTemporal arteritis: steroids Chronic back pain: narcotics  Neuromuscular disease (neuropathy)    GI/Hepatic Neg liver ROS, GERD-  Medicated and Controlled,  Endo/Other  negative endocrine ROS  Renal/GU negative Renal ROSCreat 0.58     Musculoskeletal   Abdominal   Peds  Hematology   Anesthesia Other Findings   Reproductive/Obstetrics                          Anesthesia Physical Anesthesia Plan  ASA: III  Anesthesia Plan: MAC and Regional   Post-op Pain Management:    Induction:   Airway Management Planned: Natural Airway and Simple Face Mask  Additional Equipment:   Intra-op Plan:   Post-operative Plan:   Informed Consent: I have reviewed the patients History and Physical, chart, labs and discussed the procedure including the risks, benefits and alternatives for the proposed anesthesia with the patient or authorized representative who has indicated his/her understanding and acceptance.   Consent reviewed with POA and Dental advisory given  Plan Discussed with: CRNA and Surgeon  Anesthesia Plan Comments: (Plan routine monitors, MAC with ankle block)        Anesthesia Quick Evaluation

## 2013-05-13 NOTE — H&P (Signed)
Makendra Vigeant is an 79 y.o. female.   Chief Complaint: Osteomyelitis ulceration right foot second toe HPI: Patient is an 79 year old woman with diabetes peripheral vascular disease insensate neuropathy who has had failure conservative care with osteomyelitis right foot second toe with ulceration Wagner grade 3.  Past Medical History  Diagnosis Date  . HYPONATREMIA 01/01/2009  . ANEMIA 08/20/2009  . LEUKOCYTOSIS 08/20/2009  . DEPRESSION, MAJOR, RECURRENT 02/01/2009  . Unspecified hearing loss 11/17/2008  . HYPERTENSION 11/17/2008  . TEMPORAL ARTERITIS 08/20/2009  . PYELONEPHRITIS 09/10/2009  . Cellulitis and abscess of upper arm and forearm 04/16/2010  . PRESSURE ULCER BUTTOCK 02/01/2009  . BACK PAIN, LUMBAR 07/29/2010  . WEAKNESS 01/01/2009  . Anorexia 12/19/2008  . Headache(784.0) 04/18/2010  . DIARRHEA, CHRONIC 12/19/2008  . Stress incontinence   . Hx: UTI (urinary tract infection)   . Blood transfusion   . Foot drop, bilateral   . Clostridium difficile colitis     History of  . Pancreatitis     History of  . Anxiety   . DEGENERATIVE JOINT DISEASE 11/17/2008    shoulders, arms  . History of skin cancer   . Dysrhythmia     Hx of paroxysmal a-fib  . H/O psoriatic arthritis   . History of SIADH   . H/O hypokalemia   . H/O mitral valve prolapse   . 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 GERD (gastroesophageal reflux disease)   . Mitral valve prolapse     Past Surgical History  Procedure Laterality Date  . Cholecystectomy    . Abdominal hysterectomy    . Lumbar laminectomy    . Temporal artery biopsy / ligation  06/04/2009  . Right cataract extraction    . Joint replacement      knee and hip unsure of side    Family History  Problem Relation Age of Onset  . Cancer Neg Hx     family  . Hypertension Neg Hx     family  . Arthritis Neg Hx     family   Social History:  reports that she has never smoked. She has never used smokeless tobacco. She  reports that she does not drink alcohol or use illicit drugs.  Allergies:  Allergies  Allergen Reactions  . Benazepril Hcl     REACTION: cough  . Epinephrine Hcl     REACTION: shakes  . Lidocaine Other (See Comments)    Per MAR    No prescriptions prior to admission    No results found for this or any previous visit (from the past 48 hour(s)). No results found.  Review of Systems  All other systems reviewed and are negative.    There were no vitals taken for this visit. Physical Exam  On examination patient has a palpable dorsalis pedis pulse she has ulceration down to bone with osteomyelitis right foot second toe. Assessment/Plan Assessment: Osteomyelitis ulceration right foot second toe.  Plan: We'll plan for right foot second ray amputation. Risks and benefits were discussed including infection neurovascular injury persistent pain nonhealing of the wound need for additional surgery. Patient states she understands and wished to proceed at this time.  DUDA,MARCUS V 05/13/2013, 6:17 AM

## 2013-05-14 NOTE — Evaluation (Signed)
Physical Therapy Evaluation Patient Details Name: Jo Alvarez MRN: [id number] DOB: June 20, 1925 Today's Date: 05/14/2013 Time: 6213-0865 PT Time Calculation (min): 16 min  PT Assessment / Plan / Recommendation History of Present Illness  79 year old woman with diabetes peripheral vascular disease insensate neuropathy who has had failure conservative care with osteomyelitis right foot second toe with ulceration Wagner grade 3, s/p R 2nd toe amputation  Clinical Impression  Pt admitted with above. Pt currently with functional limitations due to the deficits listed below (see PT Problem List).  Pt will benefit from skilled PT to increase their independence and safety with mobility to allow discharge to the venue listed below.  Pt poor historian today likely due to medications however from ALF and reports she requires assist for transfers and ADLs.  Pt also reports she may need more assist upon d/c.  Recommend 24/7 assist and pt may need SNF if ALF feels unable to provide required care.     PT Assessment  Patient needs continued PT services    Follow Up Recommendations  Supervision/Assistance - 24 hour;SNF    Does the patient have the potential to tolerate intense rehabilitation      Barriers to Discharge        Equipment Recommendations  None recommended by PT    Recommendations for Other Services     Frequency Min 2X/week    Precautions / Restrictions Precautions Precautions: Fall Restrictions Weight Bearing Restrictions: Yes RLE Weight Bearing: Touchdown weight bearing   Pertinent Vitals/Pain Pt reports receiving meds earlier this morning, no c/o pain only "not feeling well" so returned to comfortable supine position with LEs elevated      Mobility  Bed Mobility Bed Mobility: Supine to Sit;Sit to Supine;Scooting to HOB Supine to Sit: 4: Min assist Sit to Supine: 3: Mod assist Scooting to HOB: 1: +1 Total assist Details for Bed Mobility Assistance: assist for trunk  upright, required increased time to get EOB, assist for LEs onto bed, pt fatigued and poor strength in UEs and L LE to assist with scooting up EOB requiring total assist Transfers Transfers: Not assessed (pt reported "not feeling well" "out of her head")    Exercises     PT Diagnosis: Generalized weakness  PT Problem List: Decreased strength;Decreased activity tolerance;Decreased mobility;Decreased knowledge of precautions PT Treatment Interventions: DME instruction;Functional mobility training;Therapeutic activities;Therapeutic exercise;Wheelchair mobility training;Patient/family education;Balance training     PT Goals(Current goals can be found in the care plan section) Acute Rehab PT Goals PT Goal Formulation: With patient Time For Goal Achievement: 05/28/13 Potential to Achieve Goals: Good  Visit Information  Last PT Received On: 05/14/13 Assistance Needed: +2 History of Present Illness: 79 year old woman with diabetes peripheral vascular disease insensate neuropathy who has had failure conservative care with osteomyelitis right foot second toe with ulceration Wagner grade 3, s/p R 2nd toe amputation       Prior Functioning  Home Living Family/patient expects to be discharged to:: Assisted living Home Equipment: Wheelchair - manual Prior Function Level of Independence: Needs assistance Gait / Transfers Assistance Needed: pt reports assist required for w/c transfers Comments: pt reports requiring more assist for transfers and ADLs Communication Communication: No difficulties    Cognition  Cognition Arousal/Alertness: Suspect due to medications ("drowsy") Behavior During Therapy: WFL for tasks assessed/performed    Extremity/Trunk Assessment Upper Extremity Assessment Upper Extremity Assessment:  (poor functional use of hands observed) Lower Extremity Assessment Lower Extremity Assessment: RLE deficits/detail;LLE deficits/detail RLE Deficits / Details: bil foot drop,  generalized  weakness observed with bed mobility, hx Chronic inflammatory demyelinating neuropathy  LLE Deficits / Details: bil foot drop, generalized weakness observed with bed mobility,  hx Chronic inflammatory demyelinating neuropathy  Cervical / Trunk Assessment Cervical / Trunk Assessment: Kyphotic   Balance Balance Balance Assessed: Yes Static Sitting Balance Static Sitting - Balance Support: Bilateral upper extremity supported;Feet supported Static Sitting - Level of Assistance: 4: Min assist Static Sitting - Comment/# of Minutes: min/guard mostly however occasionally would perform posterior lean, maintains slouched posture  End of Session PT - End of Session Activity Tolerance: Patient limited by fatigue Patient left: in bed;with call bell/phone within reach;with bed alarm set  GP     Stepen Prins,KATHrine E 05/14/2013, 12:53 PM Zenovia Jarred, PT, DPT 05/14/2013 Pager: [phone]

## 2013-05-14 NOTE — Progress Notes (Deleted)
Patient ID: Jo Alvarez, female   DOB: Jan 18, 1925, 79 y.o.   MRN: [id number] Postoperative day 1 status post total knee arthroplasty. Dressing clean dry and intact hemoglobin stable. Physical therapy progressive ambulation. Patient complains of nausea and itching from the narcotic pain medication. Recommended decreasing the narcotics and feel that this should help improve her blood pressure as well. She will continue with her by mouth intake.

## 2013-05-14 NOTE — Progress Notes (Signed)
Pt experiencing high anxiety this morning, PRN Valium given, pt tolerated well.

## 2013-05-14 NOTE — Progress Notes (Signed)
Patient ID: Jo Alvarez, female   DOB: 1925/03/25, 79 y.o.   MRN: [id number] Postoperative day 1 status post second ray amputation right foot. Anticipate patient will need discharge to skilled nursing facility and we will plan for discharge to skilled nursing possibly Monday.

## 2013-05-15 NOTE — Progress Notes (Signed)
Clinical Social Work Department BRIEF PSYCHOSOCIAL ASSESSMENT 05/15/2013  Patient:  Jo Alvarez, Jo Alvarez     Account Number:  [id number]     Admit date:  05/13/2013  Clinical Social Worker:  Hendricks Milo  Date/Time:  05/15/2013 05:57 PM  Referred by:  Physician  Date Referred:  05/14/2013 Referred for  SNF Placement   Other Referral:   Interview type:  Family Other interview type:   I spoke with the patient briefly and the son Jo Alvarez [phone]    PSYCHOSOCIAL DATA Living Status:  FACILITY Admitted from facility:  [facility] [address] Level of care:  Assisted Living Primary support name:  Jo Alvarez Primary support relationship to patient:  CHILD, ADULT Degree of support available:   Very Supportive.    CURRENT CONCERNS  Other Concerns:    SOCIAL WORK ASSESSMENT / PLAN CSW met with patient briefly to discuss SNF placement. Patient was eating dinner and needed assistance and appeared confused about SNF. CSW contacted son Jo Alvarez with patient's permission. Son reported that he has arranged a bed for patient at [facility] and has already moved furniture in room 216. Son reported that he has been communicating with Jo Alvarez and Jo Alvarez. CSW asked about a back up plan and son was adamant that he had arranged a bed. CSW faxed out FL2 and clinicals to [facility]. CSW spoke with LPN Jo Alvarez at [facility] who confirmed that room 216 is for Jo Alvarez. CSW told LPN that clinicals and FL2 were faxed over in [facility]. Son also reported that patient is likely to D/C tomorrow. Son reported that patient is going to [facility] for short term but is likely going to turn into long term.   Assessment/plan status:  Psychosocial Support/Ongoing Assessment of Needs Other assessment/ plan:   Information/referral to community resources:    PATIENT'S/FAMILY'S RESPONSE TO PLAN OF CARE: Patient and son agreeable to send clinicals to  [facility].

## 2013-05-15 NOTE — Progress Notes (Addendum)
Clinical Social Work Department CLINICAL SOCIAL WORK PLACEMENT NOTE 05/15/2013  Patient:  Jo Alvarez, Jo Alvarez  Account Number:  [id number] Admit date:  05/13/2013  Clinical Social Worker:  Jetta Lout, Theresia Majors  Date/time:  05/15/2013 06:11 PM  Clinical Social Work is seeking post-discharge placement for this patient at the following level of care:   SKILLED NURSING   (*CSW will update this form in Epic as items are completed)   05/15/2013  Patient/family provided with [facility] Department of Clinical Social Work's list of facilities offering this level of care within the geographic area requested by the patient (or if unable, by the patient's family).  05/15/2013  Patient/family informed of their freedom to choose among providers that offer the needed level of care, that participate in Medicare, Medicaid or managed care program needed by the patient, have an available bed and are willing to accept the patient.    Patient/family informed of [facility]' ownership interest in [facility], as well as of the fact that they are under no obligation to receive care at this facility.  PASARR submitted to EDS on Pre-existing PASARR number received from EDS on   FL2 transmitted to all facilities in geographic area requested by pt/family on  05/15/2013 FL2 transmitted to all facilities within larger geographic area on   Patient informed that his/her managed care company has contracts with or will negotiate with  certain facilities, including the following:     Patient/family informed of bed offers received:   Patient chooses bed at [facility] Physician recommends and patient chooses bed at    Patient to be transferred to  on  05/16/13 Patient to be transferred to facility by [facility]  The following physician request were entered in Epic:   Additional Comments: Son [name] reported that he has arranged a bed at [facility] and has already moved furniture into room  216.

## 2013-05-15 NOTE — Progress Notes (Signed)
Patient ID: Jo Alvarez, female   DOB: [birth year], 79 y.o.   MRN: [id number] Postoperative day 2 amputation second ray right foot. Patient's family has arranged for skilled nursing placement. Patient will be safe for discharge to skilled nursing on Monday.

## 2013-05-16 ENCOUNTER — Telehealth: Payer: Self-pay | Admitting: Family Medicine

## 2013-05-16 MED ORDER — OXYCODONE-ACETAMINOPHEN 5-325 MG PO TABS: 1.0000 | ORAL_TABLET | ORAL | Status: AC | PRN

## 2013-05-16 NOTE — Telephone Encounter (Signed)
Home health calling to let Dr. [name] know that Jo Alvarez is in the hospital. When released, she will be a permanent resident of [facility]. She is therefore DC'd from home health services.

## 2013-05-16 NOTE — Progress Notes (Signed)
Clinical social worker assisted with patient discharge to skilled nursing facility [facility],.  CSW addressed all family questions and concerns. CSW copied chart and added all important documents. CSW also set up patient transportation with [employer]. Clinical Social Worker will sign off for now as social work intervention is no longer needed.    Sabino Niemann, MSW, [employer] [phone]

## 2013-05-16 NOTE — Discharge Summary (Signed)
Physician Discharge Summary  Patient ID: Jo Alvarez MRN: [id number] DOB/AGE: Jun 28, 1925 79 y.o.  Admit date: 05/13/2013 Discharge date: 05/16/2013  Admission Diagnoses: Osteomyelitis ulceration right foot second toe  Discharge Diagnoses: Osteomyelitis ulceration right foot second toe Active Problems:   * No active hospital problems. *   Discharged Condition: stable  Hospital Course: Patient's hospital course was essentially unremarkable. She underwent a right foot second toe amputation postoperatively patient progressed well and was discharged to skilled nursing facility.  Consults: None  Significant Diagnostic Studies: labs: Routine labs  Treatments: surgery: See operative note  Discharge Exam: Blood pressure 156/65, pulse 64, temperature 97.4 F (36.3 C), temperature source Oral, resp. rate 18, height 5\' 4"  (1.626 m), weight 54.432 kg (120 lb), SpO2 97.00%. Incision/Wound: dressing clean dry and intact  Disposition: 03-Skilled Nursing Facility  Discharge Orders   Future Orders Complete By Expires   Call MD / Call 911  As directed    Comments:     If you experience chest pain or shortness of breath, CALL 911 and be transported to the hospital emergency room.  If you develope a fever above 101 F, pus (white drainage) or increased drainage or redness at the wound, or calf pain, call your surgeon's office.   Constipation Prevention  As directed    Comments:     Drink plenty of fluids.  Prune juice may be helpful.  You may use a stool softener, such as Colace (over the counter) 100 mg twice a day.  Use MiraLax (over the counter) for constipation as needed.   Diet - low sodium heart healthy  As directed    Increase activity slowly as tolerated  As directed        Medication List         amLODipine 10 MG tablet  Commonly known as:  NORVASC  Take 1 tablet (10 mg total) by mouth daily.     calcium-vitamin D 500-200 MG-UNIT per tablet  Take 1 tablet by mouth 2 (two) times  daily with a meal.     cloNIDine 0.1 MG tablet  Commonly known as:  CATAPRES  Take 1 tablet (0.1 mg total) by mouth daily.     diazepam 5 MG tablet  Commonly known as:  VALIUM  Take 1 tablet (5 mg total) by mouth every 12 (twelve) hours as needed. anxiety     ferrous sulfate 325 (65 FE) MG tablet  Take 325 mg by mouth daily with breakfast.     gabapentin 300 MG capsule  Commonly known as:  NEURONTIN  Take 1 capsule (300 mg total) by mouth 2 (two) times daily.     leflunomide 10 MG tablet  Commonly known as:  ARAVA  Take 15 mg by mouth daily.     metoprolol succinate 12.5 mg Tb24 24 hr tablet  Commonly known as:  TOPROL-XL  Take 0.5 tablets (12.5 mg total) by mouth daily.     nystatin cream  Commonly known as:  MYCOSTATIN  Apply 1 application topically 2 (two) times daily as needed for dry skin.     oxyCODONE-acetaminophen 5-325 MG per tablet  Commonly known as:  PERCOCET/ROXICET  Take 1 tablet by mouth every 6 (six) hours.     oxyCODONE-acetaminophen 5-325 MG per tablet  Commonly known as:  ROXICET  Take 1 tablet by mouth every 4 (four) hours as needed for pain.     polyethylene glycol packet  Commonly known as:  MIRALAX / GLYCOLAX  Take 17 g by  mouth daily.     predniSONE 10 MG tablet  Commonly known as:  DELTASONE  Take 1 tablet (10 mg total) by mouth daily.     PRESERVISION AREDS Caps  Take 2 capsules by mouth 2 (two) times daily.     multivitamins ther. w/minerals Tabs tablet  Take 1 tablet by mouth daily.     promethazine 25 MG tablet  Commonly known as:  PHENERGAN  Take 25 mg by mouth every 6 (six) hours as needed for nausea.     ranitidine 150 MG tablet  Commonly known as:  ZANTAC  Take 1 tablet (150 mg total) by mouth 2 (two) times daily. For reflux.     traMADol 50 MG tablet  Commonly known as:  ULTRAM  Take 50 mg by mouth every 6 (six) hours as needed for pain.           Follow-up Information   Follow up with Jaquane Boughner V, MD In 2 weeks.    Specialty:  Orthopedic Surgery   Contact information:   [address] [place] [address] [phone]       Signed: Nadara Mustard 05/16/2013, 6:21 AM

## 2013-05-17 ENCOUNTER — Encounter (HOSPITAL_COMMUNITY): Payer: Self-pay | Admitting: Orthopedic Surgery

## 2013-08-31 NOTE — Progress Notes (Signed)
G-Codes for PT evaluation   05/14/13 1253  PT Time Calculation  PT Start Time 1106  PT Stop Time 1122  PT Time Calculation (min) 16 min  PT G-Codes **NOT FOR INPATIENT CLASS**  Functional Assessment Tool Used clinical judgement  Functional Limitation Mobility: Walking and moving around  Mobility: Walking and Moving Around Current Status (Z6109) CM  Mobility: Walking and Moving Around Goal Status (U0454) CJ  PT General Charges  $$ ACUTE PT VISIT 1 Procedure  PT Evaluation  $Initial PT Evaluation Tier I 1 Procedure  PT Treatments  $Therapeutic Activity 8-22 mins   Zenovia Jarred, PT, DPT 08/31/2013 Pager: [phone]

## 2013-10-19 ENCOUNTER — Encounter (HOSPITAL_BASED_OUTPATIENT_CLINIC_OR_DEPARTMENT_OTHER): Payer: Medicare Other | Attending: General Surgery

## 2015-03-18 ENCOUNTER — Inpatient Hospital Stay (HOSPITAL_COMMUNITY): Payer: Medicare Other

## 2015-03-18 ENCOUNTER — Emergency Department (HOSPITAL_COMMUNITY): Payer: Medicare Other

## 2015-03-18 ENCOUNTER — Inpatient Hospital Stay (HOSPITAL_COMMUNITY)
Admission: EM | Admit: 2015-03-18 | Discharge: 2015-03-21 | DRG: 481 | Disposition: A | Discharge status: Skilled Nursing Facility | Payer: Medicare Other | Attending: Internal Medicine | Admitting: Internal Medicine

## 2015-03-18 ENCOUNTER — Encounter (HOSPITAL_COMMUNITY): Payer: Self-pay | Admitting: Emergency Medicine

## 2015-03-18 DIAGNOSIS — Z8719 Personal history of other diseases of the digestive system: Secondary | ICD-10-CM | POA: Diagnosis not present

## 2015-03-18 DIAGNOSIS — Z888 Allergy status to other drugs, medicaments and biological substances status: Secondary | ICD-10-CM

## 2015-03-18 DIAGNOSIS — Z89421 Acquired absence of other right toe(s): Secondary | ICD-10-CM

## 2015-03-18 DIAGNOSIS — S72409A Unspecified fracture of lower end of unspecified femur, initial encounter for closed fracture: Secondary | ICD-10-CM | POA: Diagnosis present

## 2015-03-18 DIAGNOSIS — Z89432 Acquired absence of left foot: Secondary | ICD-10-CM

## 2015-03-18 DIAGNOSIS — K219 Gastro-esophageal reflux disease without esophagitis: Secondary | ICD-10-CM | POA: Diagnosis present

## 2015-03-18 DIAGNOSIS — Z96651 Presence of right artificial knee joint: Secondary | ICD-10-CM | POA: Diagnosis present

## 2015-03-18 DIAGNOSIS — S72401D Unspecified fracture of lower end of right femur, subsequent encounter for closed fracture with routine healing: Secondary | ICD-10-CM | POA: Diagnosis not present

## 2015-03-18 DIAGNOSIS — S7290XA Unspecified fracture of unspecified femur, initial encounter for closed fracture: Secondary | ICD-10-CM | POA: Diagnosis present

## 2015-03-18 DIAGNOSIS — E1151 Type 2 diabetes mellitus with diabetic peripheral angiopathy without gangrene: Secondary | ICD-10-CM | POA: Diagnosis present

## 2015-03-18 DIAGNOSIS — W06XXXA Fall from bed, initial encounter: Secondary | ICD-10-CM | POA: Diagnosis present

## 2015-03-18 DIAGNOSIS — Z884 Allergy status to anesthetic agent status: Secondary | ICD-10-CM

## 2015-03-18 DIAGNOSIS — I480 Paroxysmal atrial fibrillation: Secondary | ICD-10-CM | POA: Diagnosis present

## 2015-03-18 DIAGNOSIS — M1990 Unspecified osteoarthritis, unspecified site: Secondary | ICD-10-CM | POA: Diagnosis present

## 2015-03-18 DIAGNOSIS — L89159 Pressure ulcer of sacral region, unspecified stage: Secondary | ICD-10-CM | POA: Diagnosis present

## 2015-03-18 DIAGNOSIS — S72401A Unspecified fracture of lower end of right femur, initial encounter for closed fracture: Secondary | ICD-10-CM | POA: Diagnosis not present

## 2015-03-18 DIAGNOSIS — Z96642 Presence of left artificial hip joint: Secondary | ICD-10-CM | POA: Diagnosis present

## 2015-03-18 DIAGNOSIS — E860 Dehydration: Secondary | ICD-10-CM | POA: Diagnosis present

## 2015-03-18 DIAGNOSIS — Z7952 Long term (current) use of systemic steroids: Secondary | ICD-10-CM | POA: Diagnosis not present

## 2015-03-18 DIAGNOSIS — S7291XA Unspecified fracture of right femur, initial encounter for closed fracture: Secondary | ICD-10-CM

## 2015-03-18 DIAGNOSIS — Z8781 Personal history of (healed) traumatic fracture: Secondary | ICD-10-CM

## 2015-03-18 DIAGNOSIS — T84042A Periprosthetic fracture around internal prosthetic right knee joint, initial encounter: Secondary | ICD-10-CM | POA: Diagnosis present

## 2015-03-18 DIAGNOSIS — I341 Nonrheumatic mitral (valve) prolapse: Secondary | ICD-10-CM | POA: Diagnosis present

## 2015-03-18 DIAGNOSIS — I10 Essential (primary) hypertension: Secondary | ICD-10-CM | POA: Diagnosis present

## 2015-03-18 DIAGNOSIS — Z79899 Other long term (current) drug therapy: Secondary | ICD-10-CM | POA: Diagnosis not present

## 2015-03-18 DIAGNOSIS — D62 Acute posthemorrhagic anemia: Secondary | ICD-10-CM | POA: Diagnosis not present

## 2015-03-18 DIAGNOSIS — L8990 Pressure ulcer of unspecified site, unspecified stage: Secondary | ICD-10-CM | POA: Insufficient documentation

## 2015-03-18 DIAGNOSIS — D649 Anemia, unspecified: Secondary | ICD-10-CM | POA: Diagnosis present

## 2015-03-18 DIAGNOSIS — M810 Age-related osteoporosis without current pathological fracture: Secondary | ICD-10-CM | POA: Diagnosis present

## 2015-03-18 DIAGNOSIS — M316 Other giant cell arteritis: Secondary | ICD-10-CM | POA: Diagnosis present

## 2015-03-18 DIAGNOSIS — F0390 Unspecified dementia without behavioral disturbance: Secondary | ICD-10-CM | POA: Diagnosis not present

## 2015-03-18 DIAGNOSIS — Z9889 Other specified postprocedural states: Secondary | ICD-10-CM

## 2015-03-18 DIAGNOSIS — W19XXXA Unspecified fall, initial encounter: Secondary | ICD-10-CM

## 2015-03-18 DIAGNOSIS — Z66 Do not resuscitate: Secondary | ICD-10-CM | POA: Diagnosis present

## 2015-03-18 DIAGNOSIS — H9190 Unspecified hearing loss, unspecified ear: Secondary | ICD-10-CM | POA: Diagnosis present

## 2015-03-18 DIAGNOSIS — Y92129 Unspecified place in nursing home as the place of occurrence of the external cause: Secondary | ICD-10-CM

## 2015-03-18 DIAGNOSIS — I739 Peripheral vascular disease, unspecified: Secondary | ICD-10-CM | POA: Diagnosis not present

## 2015-03-18 DIAGNOSIS — Z7401 Bed confinement status: Secondary | ICD-10-CM

## 2015-03-18 DIAGNOSIS — Z01811 Encounter for preprocedural respiratory examination: Secondary | ICD-10-CM

## 2015-03-18 DIAGNOSIS — Y792 Prosthetic and other implants, materials and accessory orthopedic devices associated with adverse incidents: Secondary | ICD-10-CM | POA: Diagnosis present

## 2015-03-18 DIAGNOSIS — M79651 Pain in right thigh: Secondary | ICD-10-CM | POA: Diagnosis present

## 2015-03-18 HISTORY — DX: Peripheral vascular disease, unspecified: I73.9

## 2015-03-18 HISTORY — DX: Unspecified psychosis not due to a substance or known physiological condition: F29

## 2015-03-18 HISTORY — DX: Unspecified atrial fibrillation: I48.91

## 2015-03-18 HISTORY — DX: Anemia, unspecified: D64.9

## 2015-03-18 HISTORY — DX: Type 2 diabetes mellitus without complications: E11.9

## 2015-03-18 HISTORY — DX: Osteomyelitis of vertebra, site unspecified: M46.20

## 2015-03-18 LAB — CBC WITH DIFFERENTIAL/PLATELET
Basophils Absolute: 0 10*3/uL (ref 0.0–0.1)
Basophils Relative: 0 % (ref 0–1)
EOS ABS: 0 10*3/uL (ref 0.0–0.7)
EOS PCT: 0 % (ref 0–5)
HEMATOCRIT: 33.4 % — AB (ref 36.0–46.0)
Hemoglobin: 10.7 g/dL — ABNORMAL LOW (ref 12.0–15.0)
LYMPHS ABS: 0.9 10*3/uL (ref 0.7–4.0)
LYMPHS PCT: 6 % — AB (ref 12–46)
MCH: 30.6 pg (ref 26.0–34.0)
MCHC: 32 g/dL (ref 30.0–36.0)
MCV: 95.4 fL (ref 78.0–100.0)
MONO ABS: 1 10*3/uL (ref 0.1–1.0)
Monocytes Relative: 7 % (ref 3–12)
Neutro Abs: 12.6 10*3/uL — ABNORMAL HIGH (ref 1.7–7.7)
Neutrophils Relative %: 87 % — ABNORMAL HIGH (ref 43–77)
Platelets: 257 10*3/uL (ref 150–400)
RBC: 3.5 MIL/uL — AB (ref 3.87–5.11)
RDW: 14.4 % (ref 11.5–15.5)
WBC: 14.5 10*3/uL — ABNORMAL HIGH (ref 4.0–10.5)

## 2015-03-18 LAB — URINALYSIS, ROUTINE W REFLEX MICROSCOPIC
BILIRUBIN URINE: NEGATIVE
Glucose, UA: NEGATIVE mg/dL
HGB URINE DIPSTICK: NEGATIVE
Ketones, ur: NEGATIVE mg/dL
NITRITE: NEGATIVE
PROTEIN: NEGATIVE mg/dL
Specific Gravity, Urine: 1.019 (ref 1.005–1.030)
UROBILINOGEN UA: 0.2 mg/dL (ref 0.0–1.0)
pH: 6 (ref 5.0–8.0)

## 2015-03-18 LAB — URINE MICROSCOPIC-ADD ON

## 2015-03-18 LAB — BASIC METABOLIC PANEL
Anion gap: 4 — ABNORMAL LOW (ref 5–15)
BUN: 31 mg/dL — AB (ref 6–20)
CALCIUM: 8.2 mg/dL — AB (ref 8.9–10.3)
CO2: 27 mmol/L (ref 22–32)
CREATININE: 0.68 mg/dL (ref 0.44–1.00)
Chloride: 106 mmol/L (ref 101–111)
GFR calc Af Amer: >60 mL/min (ref >60)
GLUCOSE: 129 mg/dL — AB (ref 65–99)
Potassium: 4.8 mmol/L (ref 3.5–5.1)
SODIUM: 137 mmol/L (ref 135–145)

## 2015-03-18 LAB — PROTIME-INR
INR: 1.13 (ref 0.00–1.49)
Prothrombin Time: 14.6 seconds (ref 11.6–15.2)

## 2015-03-18 LAB — PREPARE RBC (CROSSMATCH)

## 2015-03-18 MED ORDER — OCUVITE-LUTEIN PO CAPS
2.0000 | ORAL_CAPSULE | Freq: Two times a day (BID) | ORAL | Status: DC
  Filled 2015-03-18: qty 2

## 2015-03-18 MED ORDER — POLYETHYLENE GLYCOL 3350 17 G PO PACK: 17.0000 g | PACK | ORAL | Status: DC

## 2015-03-18 MED ORDER — GABAPENTIN 300 MG PO CAPS
300.0000 mg | ORAL_CAPSULE | Freq: Two times a day (BID) | ORAL | Status: DC
  Administered 2015-03-19 – 2015-03-21 (×5): 300 mg via ORAL
  Filled 2015-03-18 (×6): qty 1

## 2015-03-18 MED ORDER — FAMOTIDINE 20 MG PO TABS
20.0000 mg | ORAL_TABLET | Freq: Two times a day (BID) | ORAL | Status: DC
  Administered 2015-03-19 – 2015-03-21 (×4): 20 mg via ORAL
  Filled 2015-03-18 (×6): qty 1

## 2015-03-18 MED ORDER — HYDRALAZINE HCL 20 MG/ML IJ SOLN: 10.0000 mg | INTRAMUSCULAR | Status: DC | PRN

## 2015-03-18 MED ORDER — HYDROCORTISONE NA SUCCINATE PF 100 MG IJ SOLR
50.0000 mg | Freq: Three times a day (TID) | INTRAMUSCULAR | Status: DC
  Administered 2015-03-19 – 2015-03-20 (×5): 50 mg via INTRAVENOUS
  Filled 2015-03-18 (×8): qty 1

## 2015-03-18 MED ORDER — HYDROCODONE-ACETAMINOPHEN 5-325 MG PO TABS
1.0000 | ORAL_TABLET | Freq: Four times a day (QID) | ORAL | Status: DC | PRN
  Administered 2015-03-20 (×2): 1 via ORAL
  Administered 2015-03-20 (×2): 2 via ORAL
  Administered 2015-03-21 (×2): 1 via ORAL
  Filled 2015-03-18 (×3): qty 1
  Filled 2015-03-18: qty 2
  Filled 2015-03-18 (×2): qty 1
  Filled 2015-03-18: qty 2

## 2015-03-18 MED ORDER — CLONIDINE HCL 0.1 MG PO TABS
0.1000 mg | ORAL_TABLET | Freq: Every day | ORAL | Status: DC
  Administered 2015-03-20 – 2015-03-21 (×2): 0.1 mg via ORAL
  Filled 2015-03-18 (×2): qty 1

## 2015-03-18 MED ORDER — AMLODIPINE BESYLATE 10 MG PO TABS
10.0000 mg | ORAL_TABLET | Freq: Every day | ORAL | Status: DC
  Administered 2015-03-20 – 2015-03-21 (×2): 10 mg via ORAL
  Filled 2015-03-18 (×2): qty 1

## 2015-03-18 MED ORDER — METOPROLOL SUCCINATE ER 25 MG PO TB24
12.5000 mg | ORAL_TABLET | Freq: Every day | ORAL | Status: DC
  Administered 2015-03-19 – 2015-03-21 (×3): 12.5 mg via ORAL
  Filled 2015-03-18 (×3): qty 1

## 2015-03-18 MED ORDER — SODIUM CHLORIDE 0.9 % IV SOLN: Freq: Once | INTRAVENOUS | Status: DC

## 2015-03-18 MED ORDER — SODIUM CHLORIDE 0.9 % IV SOLN
INTRAVENOUS | Status: DC
  Administered 2015-03-18: 10 mL/h via INTRAVENOUS

## 2015-03-18 MED ORDER — SODIUM CHLORIDE 0.9 % IV SOLN
1000.0000 mL | INTRAVENOUS | Status: DC
  Administered 2015-03-18: 1000 mL via INTRAVENOUS

## 2015-03-18 MED ORDER — LEFLUNOMIDE 10 MG PO TABS
15.0000 mg | ORAL_TABLET | Freq: Every morning | ORAL | Status: DC
  Administered 2015-03-19 – 2015-03-21 (×3): 15 mg via ORAL
  Filled 2015-03-18 (×3): qty 1.5

## 2015-03-18 MED ORDER — MORPHINE SULFATE 2 MG/ML IJ SOLN
0.5000 mg | INTRAMUSCULAR | Status: DC | PRN
  Administered 2015-03-18 – 2015-03-20 (×2): 0.5 mg via INTRAVENOUS
  Filled 2015-03-18 (×2): qty 1

## 2015-03-18 MED ORDER — FERROUS SULFATE 325 (65 FE) MG PO TABS
325.0000 mg | ORAL_TABLET | Freq: Every day | ORAL | Status: DC
  Administered 2015-03-19 – 2015-03-21 (×3): 325 mg via ORAL
  Filled 2015-03-18 (×4): qty 1

## 2015-03-18 MED ORDER — CEFAZOLIN SODIUM-DEXTROSE 2-3 GM-% IV SOLR
2.0000 g | INTRAVENOUS | Status: AC
  Administered 2015-03-19: 2 g via INTRAVENOUS
  Filled 2015-03-18: qty 50

## 2015-03-18 MED ORDER — LORAZEPAM 1 MG PO TABS
1.0000 mg | ORAL_TABLET | Freq: Three times a day (TID) | ORAL | Status: DC | PRN
  Administered 2015-03-21: 1 mg via ORAL
  Filled 2015-03-18: qty 1

## 2015-03-18 NOTE — ED Notes (Addendum)
MD at bedside. 

## 2015-03-18 NOTE — ED Notes (Signed)
Called Ortho tech to put knee immobilizer

## 2015-03-18 NOTE — ED Notes (Signed)
Called [place] secretary stated the nurse will call back.

## 2015-03-18 NOTE — ED Notes (Signed)
Bed: WA09 Expected date:  Expected time:  Means of arrival:  Comments: EMS-leg deformity from facility

## 2015-03-18 NOTE — ED Notes (Signed)
Called lab and relayed that blood is for patients surgery tomorrow.

## 2015-03-18 NOTE — ED Provider Notes (Addendum)
CSN: [id number]     Arrival date & time 03/18/15  1752 History   First MD Initiated Contact with Patient 03/18/15 1804     Chief Complaint  Patient presents with  . Knee Injury   HPI Patient presents to the emergency room for evaluation of a right lower extremity injury. The patient is a resident of a nursing facility. Unwitnessed fall 2 days ago. Staff noticed that the patient was developing swelling around her right knee area. The patient had an x-ray at the facility that confirmed a fracture. EMS was called. They noted that the patient had a gross deformity of her right lower extremity. She was splinted and brought to the emergency room. Patient was given morphine orally at the nursing facility and 25 g of fentanyl IV. The patient has history of dementia and has chronically confused. According to the EMS report the patient's that her mental status baseline. Patient will respond to my voice but does not answer my questions. Past Medical History  Diagnosis Date  . HYPONATREMIA 01/01/2009  . ANEMIA 08/20/2009  . LEUKOCYTOSIS 08/20/2009  . DEPRESSION, MAJOR, RECURRENT 02/01/2009  . Unspecified hearing loss 11/17/2008  . HYPERTENSION 11/17/2008  . TEMPORAL ARTERITIS 08/20/2009  . PYELONEPHRITIS 09/10/2009  . Cellulitis and abscess of upper arm and forearm 04/16/2010  . PRESSURE ULCER BUTTOCK 02/01/2009  . BACK PAIN, LUMBAR 07/29/2010  . WEAKNESS 01/01/2009  . Anorexia 12/19/2008  . Headache(784.0) 04/18/2010  . DIARRHEA, CHRONIC 12/19/2008  . Stress incontinence   . Hx: UTI (urinary tract infection)   . Blood transfusion   . Foot drop, bilateral   . Clostridium difficile colitis     History of  . Pancreatitis     History of  . Anxiety   . DEGENERATIVE JOINT DISEASE 11/17/2008    shoulders, arms  . History of skin cancer   . Dysrhythmia     Hx of paroxysmal a-fib  . H/O psoriatic arthritis   . History of SIADH   . H/O hypokalemia   . H/O mitral valve prolapse   . Chronic inflammatory  demyelinating neuropathy     motor and sensory  . GERD (gastroesophageal reflux disease)   . Mitral valve prolapse   . Osteomyelitis of vertebra   . Diabetes mellitus without complication   . Anemia   . Peripheral vascular disease   . Psychosis   . Atrial fibrillation    Past Surgical History  Procedure Laterality Date  . Cholecystectomy    . Abdominal hysterectomy    . Lumbar laminectomy    . Temporal artery biopsy / ligation  06/04/2009  . Right cataract extraction    . Joint replacement      knee and hip unsure of side  . Amputation Right 05/13/2013    Procedure: AMPUTATION RAY;  Surgeon: Nadara Mustard, MD;  Location: [facility] OR;  Service: Orthopedics;  Laterality: Right;  Right Foot 2nd Ray Amputation   Family History  Problem Relation Age of Onset  . Cancer Neg Hx     family  . Hypertension Neg Hx     family  . Arthritis Neg Hx     family   History  Substance Use Topics  . Smoking status: Never Smoker   . Smokeless tobacco: Never Used  . Alcohol Use: No   OB History    No data available     Review of Systems  Unable to perform ROS     Allergies  Benazepril hcl; Epinephrine hcl; and  Lidocaine  Home Medications   Prior to Admission medications   Medication Sig Start Date End Date Taking? Authorizing Provider  acetaminophen (TYLENOL) 325 MG tablet Take 650 mg by mouth every 4 (four) hours as needed for fever.   Yes Historical Provider, MD  amLODipine (NORVASC) 10 MG tablet Take 1 tablet (10 mg total) by mouth daily. 04/02/12  Yes Kristian Covey, MD  Calcium Carbonate-Vitamin D (CALCIUM-VITAMIN D) 500-200 MG-UNIT per tablet Take 1 tablet by mouth 2 (two) times daily with a meal. 09/15/11  Yes Kristian Covey, MD  cloNIDine (CATAPRES) 0.1 MG tablet Take 1 tablet (0.1 mg total) by mouth daily. 10/20/12  Yes Kristian Covey, MD  Cranberry 425 MG CAPS Take 425 mg by mouth 2 (two) times daily.   Yes Historical Provider, MD  ENSURE (ENSURE) Take 237 mLs by mouth 3  (three) times daily between meals.   Yes Historical Provider, MD  ferrous sulfate 325 (65 FE) MG tablet Take 325 mg by mouth daily with breakfast.   Yes Historical Provider, MD  gabapentin (NEURONTIN) 300 MG capsule Take 300 mg by mouth 2 (two) times daily.   Yes Historical Provider, MD  leflunomide (ARAVA) 10 MG tablet Take 15 mg by mouth every morning.    Yes Historical Provider, MD  LORazepam (ATIVAN) 1 MG tablet Take 1 mg by mouth every 8 (eight) hours as needed for anxiety.   Yes Historical Provider, MD  LORazepam (ATIVAN) 2 MG/ML concentrated solution Inject 2 mg into the muscle See admin instructions. Give 30 minutes prior to dental appointments   Yes Historical Provider, MD  metoprolol succinate (TOPROL-XL) 12.5 mg TB24 Take 0.5 tablets (12.5 mg total) by mouth daily. 11/25/12  Yes Renae Fickle, MD  Multiple Vitamins-Minerals (MULTIVITAMINS THER. W/MINERALS) TABS Take 1 tablet by mouth daily. 11/17/12  Yes Kristian Covey, MD  Multiple Vitamins-Minerals (PRESERVISION AREDS) CAPS Take 2 capsules by mouth 2 (two) times daily. 03/22/12  Yes Kristian Covey, MD  ondansetron (ZOFRAN) 4 MG tablet Take 4 mg by mouth every 8 (eight) hours as needed for nausea or vomiting.   Yes Historical Provider, MD  oxyCODONE-acetaminophen (PERCOCET) 10-325 MG per tablet Take 1 tablet by mouth every 12 (twelve) hours.   Yes Historical Provider, MD  oxyCODONE-acetaminophen (ROXICET) 5-325 MG per tablet Take 1 tablet by mouth every 4 (four) hours as needed for pain. 05/16/13  Yes Nadara Mustard, MD  polyethylene glycol (MIRALAX / GLYCOLAX) packet Take 17 g by mouth every other day.    Yes Historical Provider, MD  predniSONE (DELTASONE) 10 MG tablet Take 1 tablet (10 mg total) by mouth daily. 11/19/12  Yes Kristian Covey, MD  ranitidine (ZANTAC) 150 MG tablet Take 1 tablet (150 mg total) by mouth 2 (two) times daily. For reflux. 11/17/12  Yes Kristian Covey, MD  traMADol (ULTRAM) 50 MG tablet Take 50 mg by mouth  every 6 (six) hours as needed for pain.   Yes Historical Provider, MD  vitamin C (ASCORBIC ACID) 500 MG tablet Take 500 mg by mouth 2 (two) times daily.   Yes Historical Provider, MD  diazepam (VALIUM) 5 MG tablet Take 1 tablet (5 mg total) by mouth every 12 (twelve) hours as needed. anxiety Patient not taking: Reported on 03/18/2015 02/06/12   Kristian Covey, MD  nystatin cream (MYCOSTATIN) Apply 1 application topically 2 (two) times daily as needed for dry skin. Patient not taking: Reported on 03/18/2015 04/20/13   Kristian Covey,  MD  oxyCODONE-acetaminophen (PERCOCET/ROXICET) 5-325 MG per tablet Take 1 tablet by mouth every 6 (six) hours. Patient not taking: Reported on 03/18/2015 04/15/13   Kristian CoveyBruce W Burchette, MD   BP 135/61 mmHg  Pulse 73  Temp(Src) 97.8 F (36.6 C) (Oral)  Resp 20  SpO2 97% Physical Exam  Constitutional: No distress.  Frail, elderly  HENT:  Head: Normocephalic and atraumatic.  Right Ear: External ear normal.  Left Ear: External ear normal.  Eyes: Conjunctivae are normal. Right eye exhibits no discharge. Left eye exhibits no discharge. No scleral icterus.  Neck: Neck supple. No tracheal deviation present.  Cardiovascular: Normal rate, regular rhythm and intact distal pulses.   Pulmonary/Chest: Effort normal and breath sounds normal. No stridor. No respiratory distress. She has no wheezes. She has no rales.  Abdominal: Soft. Bowel sounds are normal. She exhibits no distension. There is no tenderness. There is no rebound and no guarding.  Musculoskeletal: She exhibits no edema or tenderness.       Right knee: She exhibits swelling, ecchymosis and deformity.  Patient has gross deformity proximal to the right knee, ecchymoses and edema, distal neurovascularly intact, lower leg appears to be rotated externally compared to the proximal thigh  Neurological: No cranial nerve deficit (no facial droop, extraocular movements intact, no slurred speech). She exhibits normal  muscle tone. She displays no seizure activity. GCS eye subscore is 3. GCS verbal subscore is 4. GCS motor subscore is 6.  The patient is confused, she does not answer most of my questions, does respond intermittently. Patient does not move all extremities when asked to do so,  Skin: Skin is warm and dry. No rash noted. There is pallor.  Psychiatric: She has a normal mood and affect.  Nursing note and vitals reviewed.   ED Course  Procedures (including critical care time) Labs Review Labs Reviewed  BASIC METABOLIC PANEL - Abnormal; Notable for the following:    Glucose, Bld 129 (*)    BUN 31 (*)    Calcium 8.2 (*)    Anion gap 4 (*)    All other components within normal limits  CBC WITH DIFFERENTIAL/PLATELET - Abnormal; Notable for the following:    WBC 14.5 (*)    RBC 3.50 (*)    Hemoglobin 10.7 (*)    HCT 33.4 (*)    Neutrophils Relative % 87 (*)    Neutro Abs 12.6 (*)    Lymphocytes Relative 6 (*)    All other components within normal limits  PROTIME-INR  TYPE AND SCREEN  PREPARE RBC (CROSSMATCH)    Imaging Review Ct Head Wo Contrast  03/18/2015   CLINICAL DATA:  81 year old female with fall and confusion  EXAM: CT HEAD WITHOUT CONTRAST  TECHNIQUE: Contiguous axial images were obtained from the base of the skull through the vertex without intravenous contrast.  COMPARISON:  CT dated 05/29/2011  FINDINGS: The ventricles are dilated and the sulci are prominent compatible with age-related atrophy. Periventricular and deep white matter hypodensities represent chronic microvascular ischemic changes. There is no intracranial hemorrhage. No mass effect or midline shift identified.  The visualized paranasal sinuses and mastoid air cells are well aerated. The calvarium is intact.  Surgical clips noted in the subcutaneous soft tissues of the left parotid area.  IMPRESSION: No acute intracranial pathology.  Age-related atrophy and chronic microvascular ischemic disease.  If symptoms persist  and there are no contraindications, MRI may provide better evaluation if clinically indicated.   Electronically Signed   By: Burtis JunesArash  Radparvar M.D.   On: 03/18/2015 18:51   Dg Femur, Min 2 Views Right  03/18/2015   CLINICAL DATA:  Unwitnessed fall 2 days ago, RIGHT knee swelling  EXAM: RIGHT FEMUR 2 VIEWS  COMPARISON:  None  FINDINGS: Marked osseous demineralization.  RIGHT hip joint space preserved.  Components of a RIGHT knee prosthesis are identified.  Displaced oblique fracture of the distal RIGHT femoral metaphysis extending to the femoral component of the prosthesis.  RIGHT knee/lower leg appear externally rotated approximately 90 degrees.  No additional femoral fracture or dislocation identified.  Extensive atherosclerotic calcifications noted.  IMPRESSION: Osseous demineralization and evidence of a prior RIGHT knee arthroplasty.  Displaced distal RIGHT femoral metaphyseal fracture extending to the femoral component of the prosthesis with approximately 90 degrees of external rotation of the knee/RIGHT lower leg.   Electronically Signed   By: Ulyses Southward M.D.   On: 03/18/2015 18:50     MDM  Pt was sent with outpatient xrays.  I personally reviewed them.  She does have a distal femur fracture proximal to an artificial knee prosthesis, the distal femur is posteriorly located and overlapping several centimeters   Final diagnoses:  Fracture of distal femur, right, closed, initial encounter  Dementia, without behavioral disturbance  Dehydration    I spoke with Dr Turner Daniels who has evaluated the patient in the ED.  Plan is to admit to [facility] hospital with plans for operative treatment tomorrow.  I have spoken to the patient's son and discussed the findings with him.  Plan on medical admission considering her mutlipe medical problems.    Linwood Dibbles, MD 03/18/15 1610  Linwood Dibbles, MD 03/18/15 2018

## 2015-03-18 NOTE — ED Notes (Signed)
Patient transported to X-ray 

## 2015-03-18 NOTE — H&P (Signed)
[facility] History and Physical  Jo Alvarez GNF:[id number] DOB: 01-14-1925 DOA: 03/18/2015  Referring physician: Patsy Lager. PCP: Kristian Covey, MD  Specialists: None.  Chief Complaint: Right thigh pain.  HPI: Jo Alvarez is a 81 y.o. female with history of dementia, hypertension, ischemic colitis, peripheral vascular disease, history of gangrene of the left foot which fell off, chronic anemia, foot drop and osteomyelitis of the right foot second toe requiring surgery was brought to the ER after patient was complaining of pain in her right thigh. As per the son who provided most of the history patient probably had a fall at the nursing home 2 days ago. Patient is usually nonambulatory. X-rays reveal right sided periprosthetic distal femur fracture. On-call orthopedic surgeon Dr. Turner Daniels has been consulted and patient has been admitted for further management. As per the son patient did not have any nausea vomiting abdominal pain diarrhea chest pain or shortness of breath. Patient has dementia and does not provide much to the history.   Review of Systems: As presented in the history of presenting illness, rest negative.  Past Medical History  Diagnosis Date  . HYPONATREMIA 01/01/2009  . ANEMIA 08/20/2009  . LEUKOCYTOSIS 08/20/2009  . DEPRESSION, MAJOR, RECURRENT 02/01/2009  . Unspecified hearing loss 11/17/2008  . HYPERTENSION 11/17/2008  . TEMPORAL ARTERITIS 08/20/2009  . PYELONEPHRITIS 09/10/2009  . Cellulitis and abscess of upper arm and forearm 04/16/2010  . PRESSURE ULCER BUTTOCK 02/01/2009  . BACK PAIN, LUMBAR 07/29/2010  . WEAKNESS 01/01/2009  . Anorexia 12/19/2008  . Headache(784.0) 04/18/2010  . DIARRHEA, CHRONIC 12/19/2008  . Stress incontinence   . Hx: UTI (urinary tract infection)   . Blood transfusion   . Foot drop, bilateral   . Clostridium difficile colitis     History of  . Pancreatitis     History of  . Anxiety   . DEGENERATIVE JOINT DISEASE 11/17/2008     shoulders, arms  . History of skin cancer   . Dysrhythmia     Hx of paroxysmal a-fib  . H/O psoriatic arthritis   . History of SIADH   . H/O hypokalemia   . H/O mitral valve prolapse   . 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 GERD (gastroesophageal reflux disease)   . Mitral valve prolapse   . Osteomyelitis of vertebra   . Diabetes mellitus without complication   . Anemia   . Peripheral vascular disease   . Psychosis   . Atrial fibrillation    Past Surgical History  Procedure Laterality Date  . Cholecystectomy    . Abdominal hysterectomy    . Lumbar laminectomy    . Temporal artery biopsy / ligation  06/04/2009  . Right cataract extraction    . Joint replacement      knee and hip unsure of side  . Amputation Right 05/13/2013    Procedure: AMPUTATION RAY;  Surgeon: Nadara Mustard, MD;  Location: [facility] OR;  Service: Orthopedics;  Laterality: Right;  Right Foot 2nd Ray Amputation   Social History:  reports that she has never smoked. She has never used smokeless tobacco. She reports that she does not drink alcohol or use illicit drugs. Where does patient live nursing home.  Can patient participate in ADLs? No.  Allergies  Allergen Reactions  . Benazepril Hcl Cough  . Epinephrine Hcl Other (See Comments)    REACTION: shakes  . Lidocaine Other (See Comments)    Per MAR    Family History:  Family  History  Problem Relation Age of Onset  . Cancer Neg Hx     family  . Hypertension Neg Hx     family  . Arthritis Neg Hx     family  . Lung cancer Father       Prior to Admission medications   Medication Sig Start Date End Date Taking? Authorizing Provider  acetaminophen (TYLENOL) 325 MG tablet Take 650 mg by mouth every 4 (four) hours as needed for fever.   Yes Historical Provider, MD  amLODipine (NORVASC) 10 MG tablet Take 1 tablet (10 mg total) by mouth daily. 04/02/12  Yes Kristian Covey, MD  Calcium Carbonate-Vitamin D (CALCIUM-VITAMIN D)  500-200 MG-UNIT per tablet Take 1 tablet by mouth 2 (two) times daily with a meal. 09/15/11  Yes Kristian Covey, MD  cloNIDine (CATAPRES) 0.1 MG tablet Take 1 tablet (0.1 mg total) by mouth daily. 10/20/12  Yes Kristian Covey, MD  Cranberry 425 MG CAPS Take 425 mg by mouth 2 (two) times daily.   Yes Historical Provider, MD  ENSURE (ENSURE) Take 237 mLs by mouth 3 (three) times daily between meals.   Yes Historical Provider, MD  ferrous sulfate 325 (65 FE) MG tablet Take 325 mg by mouth daily with breakfast.   Yes Historical Provider, MD  gabapentin (NEURONTIN) 300 MG capsule Take 300 mg by mouth 2 (two) times daily.   Yes Historical Provider, MD  leflunomide (ARAVA) 10 MG tablet Take 15 mg by mouth every morning.    Yes Historical Provider, MD  LORazepam (ATIVAN) 1 MG tablet Take 1 mg by mouth every 8 (eight) hours as needed for anxiety.   Yes Historical Provider, MD  LORazepam (ATIVAN) 2 MG/ML concentrated solution Inject 2 mg into the muscle See admin instructions. Give 30 minutes prior to dental appointments   Yes Historical Provider, MD  metoprolol succinate (TOPROL-XL) 12.5 mg TB24 Take 0.5 tablets (12.5 mg total) by mouth daily. 11/25/12  Yes Renae Fickle, MD  Multiple Vitamins-Minerals (MULTIVITAMINS THER. W/MINERALS) TABS Take 1 tablet by mouth daily. 11/17/12  Yes Kristian Covey, MD  Multiple Vitamins-Minerals (PRESERVISION AREDS) CAPS Take 2 capsules by mouth 2 (two) times daily. 03/22/12  Yes Kristian Covey, MD  ondansetron (ZOFRAN) 4 MG tablet Take 4 mg by mouth every 8 (eight) hours as needed for nausea or vomiting.   Yes Historical Provider, MD  oxyCODONE-acetaminophen (PERCOCET) 10-325 MG per tablet Take 1 tablet by mouth every 12 (twelve) hours.   Yes Historical Provider, MD  oxyCODONE-acetaminophen (ROXICET) 5-325 MG per tablet Take 1 tablet by mouth every 4 (four) hours as needed for pain. 05/16/13  Yes Nadara Mustard, MD  polyethylene glycol (MIRALAX / GLYCOLAX) packet Take  17 g by mouth every other day.    Yes Historical Provider, MD  predniSONE (DELTASONE) 10 MG tablet Take 1 tablet (10 mg total) by mouth daily. 11/19/12  Yes Kristian Covey, MD  ranitidine (ZANTAC) 150 MG tablet Take 1 tablet (150 mg total) by mouth 2 (two) times daily. For reflux. 11/17/12  Yes Kristian Covey, MD  traMADol (ULTRAM) 50 MG tablet Take 50 mg by mouth every 6 (six) hours as needed for pain.   Yes Historical Provider, MD  vitamin C (ASCORBIC ACID) 500 MG tablet Take 500 mg by mouth 2 (two) times daily.   Yes Historical Provider, MD  diazepam (VALIUM) 5 MG tablet Take 1 tablet (5 mg total) by mouth every 12 (twelve) hours as needed. anxiety  Patient not taking: Reported on 03/18/2015 02/06/12   Kristian CoveyBruce W Burchette, MD  nystatin cream (MYCOSTATIN) Apply 1 application topically 2 (two) times daily as needed for dry skin. Patient not taking: Reported on 03/18/2015 04/20/13   Kristian CoveyBruce W Burchette, MD  oxyCODONE-acetaminophen (PERCOCET/ROXICET) 5-325 MG per tablet Take 1 tablet by mouth every 6 (six) hours. Patient not taking: Reported on 03/18/2015 04/15/13   Kristian CoveyBruce W Burchette, MD    Physical Exam: Filed Vitals:   03/18/15 1800 03/18/15 2021  BP: 135/61 142/53  Pulse: 73 75  Temp: 97.8 F (36.6 C)   TempSrc: Oral   Resp: 20 14  SpO2: 97% 96%     General:  Moderately built and poorly nourished.   Eyes: Anicteric no pallor. Poor vision in left eye.   ENT: No discharge from the ears eyes nose and mouth.   Neck: No mass felt.   Cardiovascular:S1-S2 heard.   Respiratory: No rhonchi or crepitations.   Abdomen: Soft nontender bowel sounds present.   Skin: Sacral decubitus ulcer stage I.   Musculoskeletal: Left distal leg is dressed. Right leg is already in traction.   Psychiatric: Patient provides minimal history.  Neurologic: Alert awake oriented to name and recognizes her son. Has poor vision of the left eye.  Labs on Admission:  Basic Metabolic Panel:  Recent Labs Lab  03/18/15 1853  NA 137  K 4.8  CL 106  CO2 27  GLUCOSE 129*  BUN 31*  CREATININE 0.68  CALCIUM 8.2*   Liver Function Tests: No results for input(s): AST, ALT, ALKPHOS, BILITOT, PROT, ALBUMIN in the last 168 hours. No results for input(s): LIPASE, AMYLASE in the last 168 hours. No results for input(s): AMMONIA in the last 168 hours. CBC:  Recent Labs Lab 03/18/15 1853  WBC 14.5*  NEUTROABS 12.6*  HGB 10.7*  HCT 33.4*  MCV 95.4  PLT 257   Cardiac Enzymes: No results for input(s): CKTOTAL, CKMB, CKMBINDEX, TROPONINI in the last 168 hours.  BNP (last 3 results) No results for input(s): BNP in the last 8760 hours.  ProBNP (last 3 results) No results for input(s): PROBNP in the last 8760 hours.  CBG: No results for input(s): GLUCAP in the last 168 hours.  Radiological Exams on Admission: Ct Head Wo Contrast  03/18/2015   CLINICAL DATA:  81 year old female with fall and confusion  EXAM: CT HEAD WITHOUT CONTRAST  TECHNIQUE: Contiguous axial images were obtained from the base of the skull through the vertex without intravenous contrast.  COMPARISON:  CT dated 05/29/2011  FINDINGS: The ventricles are dilated and the sulci are prominent compatible with age-related atrophy. Periventricular and deep white matter hypodensities represent chronic microvascular ischemic changes. There is no intracranial hemorrhage. No mass effect or midline shift identified.  The visualized paranasal sinuses and mastoid air cells are well aerated. The calvarium is intact.  Surgical clips noted in the subcutaneous soft tissues of the left parotid area.  IMPRESSION: No acute intracranial pathology.  Age-related atrophy and chronic microvascular ischemic disease.  If symptoms persist and there are no contraindications, MRI may provide better evaluation if clinically indicated.   Electronically Signed   By: Elgie CollardArash  Radparvar M.D.   On: 03/18/2015 18:51   Dg Femur, Min 2 Views Right  03/18/2015   CLINICAL DATA:   Unwitnessed fall 2 days ago, RIGHT knee swelling  EXAM: RIGHT FEMUR 2 VIEWS  COMPARISON:  None  FINDINGS: Marked osseous demineralization.  RIGHT hip joint space preserved.  Components of a RIGHT  knee prosthesis are identified.  Displaced oblique fracture of the distal RIGHT femoral metaphysis extending to the femoral component of the prosthesis.  RIGHT knee/lower leg appear externally rotated approximately 90 degrees.  No additional femoral fracture or dislocation identified.  Extensive atherosclerotic calcifications noted.  IMPRESSION: Osseous demineralization and evidence of a prior RIGHT knee arthroplasty.  Displaced distal RIGHT femoral metaphyseal fracture extending to the femoral component of the prosthesis with approximately 90 degrees of external rotation of the knee/RIGHT lower leg.   Electronically Signed   By: Ulyses Southward M.D.   On: 03/18/2015 18:50    EKG: Independently reviewed. Normal sinus rhythm.   Assessment/Plan Principal Problem:   Fracture of distal femur Active Problems:   Essential hypertension   TEMPORAL ARTERITIS   Femur fracture   1. Fracture of the distal femur -appreciate orthopedic surgery consult. At this time plan is to do surgery for pain relief and if not possible then made to a below-knee amputation. Patient is high risk for surgery given her multiple medical comorbidities. Patient's son has been briefed about the plan. Patient will be kept nothing by mouth past midnight. 2. History of temporal arteritis - patient at this time is been placed on stress dose steroids may change to oral prednisone when patient can take orally. 3. Hypertension - continue present medications. 4. Mild leukocytosis - chest x-ray is unremarkable. Formal UA. 5. Peripheral vascular disease status post left foot falling off automatically. 6. Chronic anemia - follow CBC. 7. Dementia - no acute issues at this time. 8. History of ischemic colitis.  9. History of paroxysmal atrial  fibrillation not on anticoagulation secondary to GI bleed.  Patient will be transferred to [facility]. Patient's son is agreeable to transfer. Dr. Julian Reil will be the accepting physician.   DVT ProphylaxisSCDs.  Code Status: DO NOT RESUSCITATE.  Family Communication: Discussed patient's son whose health care power of attorney.  Disposition Plan: Admit to inpatient.    Jo Alvarez N. [facility] Pager [phone].  If 7PM-7AM, please contact night-coverage [email or web address] Password [facility] 03/18/2015, 9:18 PM

## 2015-03-18 NOTE — Consult Note (Addendum)
Reason for Consult: Right distal femur periprosthetic fracture Referring Physician: [name] is an 81 y.o. female.  HPI: Moderately demented 81 year old woman resident of [facility], nonambulator for the last 2 years fell out of bed and probably 2 days ago. Was noted to have deformity to the right distal femur above a total knee that had been placed in [place] 15-20 years ago according to her sign. X-ray showed a periprosthetic fracture and she was transferred to the [facility] emergency room for evaluation and treatment. She has multiple medical problems including severe peripheral vascular disease her left foot actually fell off from dry gangrene a few years ago, she's had a second ray amputation with Dr. Lajoyce Corners on the right foot in 2014. She's also had some pressure ulcers in the past. On the left side she's had a total hip that is been revised 3. I've talked with her son [name] who has power of attorney and she is DO NOT RESUSCITATE. His goal is that she has pain relief to allow nursing care. The patient cannot provide any accurate history to questioning.  Past Medical History  Diagnosis Date  . HYPONATREMIA 01/01/2009  . ANEMIA 08/20/2009  . LEUKOCYTOSIS 08/20/2009  . DEPRESSION, MAJOR, RECURRENT 02/01/2009  . Unspecified hearing loss 11/17/2008  . HYPERTENSION 11/17/2008  . TEMPORAL ARTERITIS 08/20/2009  . PYELONEPHRITIS 09/10/2009  . Cellulitis and abscess of upper arm and forearm 04/16/2010  . PRESSURE ULCER BUTTOCK 02/01/2009  . BACK PAIN, LUMBAR 07/29/2010  . WEAKNESS 01/01/2009  . Anorexia 12/19/2008  . Headache(784.0) 04/18/2010  . DIARRHEA, CHRONIC 12/19/2008  . Stress incontinence   . Hx: UTI (urinary tract infection)   . Blood transfusion   . Foot drop, bilateral   . Clostridium difficile colitis     History of  . Pancreatitis     History of  . Anxiety   . DEGENERATIVE JOINT DISEASE 11/17/2008    shoulders, arms  . History of skin  cancer   . Dysrhythmia     Hx of paroxysmal a-fib  . H/O psoriatic arthritis   . History of SIADH   . H/O hypokalemia   . H/O mitral valve prolapse   . 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 GERD (gastroesophageal reflux disease)   . Mitral valve prolapse   . Osteomyelitis of vertebra   . Diabetes mellitus without complication   . Anemia   . Peripheral vascular disease   . Psychosis   . Atrial fibrillation     Past Surgical History  Procedure Laterality Date  . Cholecystectomy    . Abdominal hysterectomy    . Lumbar laminectomy    . Temporal artery biopsy / ligation  06/04/2009  . Right cataract extraction    . Joint replacement      knee and hip unsure of side  . Amputation Right 05/13/2013    Procedure: AMPUTATION RAY;  Surgeon: Nadara Mustard, MD;  [facility];  Service: Orthopedics;  Laterality: Right;  Right Foot 2nd Ray Amputation    Family History  Problem Relation Age of Onset  . Cancer Neg Hx     family  . Hypertension Neg Hx     family  . Arthritis Neg Hx     family    Social History:  reports that she has never smoked. She has never used smokeless tobacco. She reports that she does not drink alcohol or use illicit drugs.  Allergies:  Allergies  Allergen Reactions  . Benazepril Hcl Cough  . Epinephrine Hcl Other (See Comments)    REACTION: shakes  . Lidocaine Other (See Comments)    Per MAR    Medications: I have reviewed the patient's current medications.  Results for orders placed or performed during the hospital encounter of 03/18/15 (from the past 48 hour(s))  CBC WITH DIFFERENTIAL     Status: Abnormal   Collection Time: 03/18/15  6:53 PM  Result Value Ref Range   WBC 14.5 (H) 4.0 - 10.5 K/uL   RBC 3.50 (L) 3.87 - 5.11 MIL/uL   Hemoglobin 10.7 (L) 12.0 - 15.0 g/dL   HCT 16.133.4 (L) 09.636.0 - 04.546.0 %   MCV 95.4 78.0 - 100.0 fL   MCH 30.6 26.0 - 34.0 pg   MCHC 32.0 30.0 - 36.0 g/dL   RDW 40.914.4 81.111.5 - 91.415.5 %    Platelets 257 150 - 400 K/uL   Neutrophils Relative % 87 (H) 43 - 77 %   Neutro Abs 12.6 (H) 1.7 - 7.7 K/uL   Lymphocytes Relative 6 (L) 12 - 46 %   Lymphs Abs 0.9 0.7 - 4.0 K/uL   Monocytes Relative 7 3 - 12 %   Monocytes Absolute 1.0 0.1 - 1.0 K/uL   Eosinophils Relative 0 0 - 5 %   Eosinophils Absolute 0.0 0.0 - 0.7 K/uL   Basophils Relative 0 0 - 1 %   Basophils Absolute 0.0 0.0 - 0.1 K/uL  Protime-INR     Status: None   Collection Time: 03/18/15  6:53 PM  Result Value Ref Range   Prothrombin Time 14.6 11.6 - 15.2 seconds   INR 1.13 0.00 - 1.49  Type and screen     Status: None   Collection Time: 03/18/15  6:53 PM  Result Value Ref Range   ABO/RH(D) O POS    Antibody Screen NEG    Sample Expiration 03/21/2015     Ct Head Wo Contrast  03/18/2015   CLINICAL DATA:  81 year old female with fall and confusion  EXAM: CT HEAD WITHOUT CONTRAST  TECHNIQUE: Contiguous axial images were obtained from the base of the skull through the vertex without intravenous contrast.  COMPARISON:  CT dated 05/29/2011  FINDINGS: The ventricles are dilated and the sulci are prominent compatible with age-related atrophy. Periventricular and deep white matter hypodensities represent chronic microvascular ischemic changes. There is no intracranial hemorrhage. No mass effect or midline shift identified.  The visualized paranasal sinuses and mastoid air cells are well aerated. The calvarium is intact.  Surgical clips noted in the subcutaneous soft tissues of the left parotid area.  IMPRESSION: No acute intracranial pathology.  Age-related atrophy and chronic microvascular ischemic disease.  If symptoms persist and there are no contraindications, MRI may provide better evaluation if clinically indicated.   Electronically Signed   By: Elgie CollardArash  Radparvar M.D.   On: 03/18/2015 18:51   Dg Femur, Min 2 Views Right  03/18/2015   CLINICAL DATA:  Unwitnessed fall 2 days ago, RIGHT knee swelling  EXAM: RIGHT FEMUR 2 VIEWS   COMPARISON:  None  FINDINGS: Marked osseous demineralization.  RIGHT hip joint space preserved.  Components of a RIGHT knee prosthesis are identified.  Displaced oblique fracture of the distal RIGHT femoral metaphysis extending to the femoral component of the prosthesis.  RIGHT knee/lower leg appear externally rotated approximately 90 degrees.  No additional femoral fracture or dislocation identified.  Extensive atherosclerotic calcifications noted.  IMPRESSION: Osseous demineralization  and evidence of a prior RIGHT knee arthroplasty.  Displaced distal RIGHT femoral metaphyseal fracture extending to the femoral component of the prosthesis with approximately 90 degrees of external rotation of the knee/RIGHT lower leg.   Electronically Signed   By: Ulyses Southward M.D.   On: 03/18/2015 18:50    ROS   patient admits to pain to the right lower extremity she denies any specific shortness of breath or chest pains.  Blood pressure 135/61, pulse 73, temperature 97.8 F (36.6 C), temperature source Oral, resp. rate 20, SpO2 97 %. Physical Exam   The right lower extremity is externally rotated and swollen just above the knee and there is some medial bruising. The skin is intact there are no cuts scrapes or abrasions. The right foot has an obvious foot drop but according to her son has been present for many years secondary to back surgery and peripheral neuropathy. She does have some mild sensation intact to the foot but it is definitely diminished.  Assessment: Very frail 81 year old woman with severe osteoporosis secondary to disuse nonambulatory for the last 2 years, severe peripheral vascular disease as evidenced by the loss of her left foot from dry gangrene and of the second ray amputation on the right foot also from gangrene. She is a nonambulator and her goal is to provide pain relief and facilitate her nursing care at the skilled nursing center.  Plan: After discussing options with her son, I applied  traction to the right lower extremity and internally rotated her at the knee and applied a 22 inch knee immobilizer with the orthopedic tech. After the maneuver the foot was no longer rotated and external rotation. She still has significant pain. She is placed on the surgery schedule tomorrow for open reduction internal fixation using either a lateral locking plate or a Phoenix nail depending on the quality of the bone. If the bone quality is so poor that it does not provide a basis for fixation we will proceed with above-knee amputation. Above-knee amputation would provide pain relief and facilitate her nursing care. The patient will be admitted to the medicine service because of her multiple medical issues, she'll be transferred to [facility] where she is on the surgery schedule to follow my elective list tomorrow.  Nestor Lewandowsky 03/18/2015, 7:56 PM

## 2015-03-18 NOTE — ED Notes (Signed)
[facility]. Staff reports pt had an unwitnessed fall 2 days and had R knee swelling. Took imaging at facility (CD in chart) which showed R knee fracture. EMS noted R knee was bent to the side upon arrival. EMS splinted knee prior to ED arrival. Pt was given 0.5mg  PO morphine at 1500 and EMS gave 25 mcg fentanyl to splint leg. R lower thigh and knee swollen. Skin warm to touch. Cap refill under 2 seconds. Pt confused with EMS, facility states this is her norm.

## 2015-03-18 NOTE — ED Notes (Signed)
Patient not in room at this time, will collect labs upon return.

## 2015-03-19 ENCOUNTER — Encounter (HOSPITAL_COMMUNITY): Payer: Self-pay | Admitting: Anesthesiology

## 2015-03-19 ENCOUNTER — Inpatient Hospital Stay (HOSPITAL_COMMUNITY): Payer: Medicare Other | Admitting: Anesthesiology

## 2015-03-19 ENCOUNTER — Inpatient Hospital Stay (HOSPITAL_COMMUNITY): Payer: Medicare Other

## 2015-03-19 ENCOUNTER — Encounter (HOSPITAL_COMMUNITY)
Admission: EM | Disposition: A | Discharge status: Skilled Nursing Facility | Payer: Self-pay | Source: Home / Self Care | Attending: Internal Medicine

## 2015-03-19 DIAGNOSIS — S72401D Unspecified fracture of lower end of right femur, subsequent encounter for closed fracture with routine healing: Secondary | ICD-10-CM

## 2015-03-19 DIAGNOSIS — L8990 Pressure ulcer of unspecified site, unspecified stage: Secondary | ICD-10-CM | POA: Insufficient documentation

## 2015-03-19 HISTORY — PX: ORIF PERIPROSTHETIC FRACTURE: SHX5034

## 2015-03-19 LAB — CBC
HCT: 31.1 % — ABNORMAL LOW (ref 36.0–46.0)
HEMOGLOBIN: 9.7 g/dL — AB (ref 12.0–15.0)
MCH: 29.5 pg (ref 26.0–34.0)
MCHC: 31.2 g/dL (ref 30.0–36.0)
MCV: 94.5 fL (ref 78.0–100.0)
Platelets: 250 10*3/uL (ref 150–400)
RBC: 3.29 MIL/uL — ABNORMAL LOW (ref 3.87–5.11)
RDW: 14.5 % (ref 11.5–15.5)
WBC: 13.8 10*3/uL — ABNORMAL HIGH (ref 4.0–10.5)

## 2015-03-19 LAB — COMPREHENSIVE METABOLIC PANEL
ALT: 12 U/L — ABNORMAL LOW (ref 14–54)
AST: 14 U/L — AB (ref 15–41)
Albumin: 3 g/dL — ABNORMAL LOW (ref 3.5–5.0)
Alkaline Phosphatase: 59 U/L (ref 38–126)
Anion gap: 7 (ref 5–15)
BUN: 23 mg/dL — AB (ref 6–20)
CALCIUM: 8.3 mg/dL — AB (ref 8.9–10.3)
CHLORIDE: 107 mmol/L (ref 101–111)
CO2: 26 mmol/L (ref 22–32)
CREATININE: 0.59 mg/dL (ref 0.44–1.00)
GFR calc Af Amer: >60 mL/min (ref >60)
GFR calc non Af Amer: >60 mL/min (ref >60)
Glucose, Bld: 114 mg/dL — ABNORMAL HIGH (ref 65–99)
Potassium: 4.4 mmol/L (ref 3.5–5.1)
Sodium: 140 mmol/L (ref 135–145)
Total Bilirubin: 0.6 mg/dL (ref 0.3–1.2)
Total Protein: 5.6 g/dL — ABNORMAL LOW (ref 6.5–8.1)

## 2015-03-19 LAB — GLUCOSE, CAPILLARY: GLUCOSE-CAPILLARY: 104 mg/dL — AB (ref 65–99)

## 2015-03-19 LAB — PREPARE RBC (CROSSMATCH)

## 2015-03-19 LAB — ABO/RH: ABO/RH(D): O POS

## 2015-03-19 LAB — MRSA PCR SCREENING: MRSA by PCR: NEGATIVE

## 2015-03-19 SURGERY — OPEN REDUCTION INTERNAL FIXATION (ORIF) PERIPROSTHETIC FRACTURE
Anesthesia: General | Site: Leg Lower | Laterality: Right

## 2015-03-19 MED ORDER — BISACODYL 5 MG PO TBEC: 5.0000 mg | DELAYED_RELEASE_TABLET | Freq: Every day | ORAL | Status: DC | PRN

## 2015-03-19 MED ORDER — GLYCOPYRROLATE 0.2 MG/ML IJ SOLN
INTRAMUSCULAR | Status: AC
  Filled 2015-03-19: qty 2

## 2015-03-19 MED ORDER — ACETAMINOPHEN 325 MG PO TABS
650.0000 mg | ORAL_TABLET | Freq: Four times a day (QID) | ORAL | Status: DC | PRN
  Administered 2015-03-20: 650 mg via ORAL
  Filled 2015-03-19: qty 2

## 2015-03-19 MED ORDER — METOCLOPRAMIDE HCL 5 MG PO TABS: 5.0000 mg | ORAL_TABLET | Freq: Three times a day (TID) | ORAL | Status: DC | PRN

## 2015-03-19 MED ORDER — CEFUROXIME SODIUM 1.5 G IJ SOLR
INTRAMUSCULAR | Status: DC | PRN
  Administered 2015-03-19: 1.5 g

## 2015-03-19 MED ORDER — SODIUM CHLORIDE 0.9 % IV SOLN: Freq: Once | INTRAVENOUS | Status: DC

## 2015-03-19 MED ORDER — ALBUMIN HUMAN 5 % IV SOLN
INTRAVENOUS | Status: DC | PRN
  Administered 2015-03-19 (×2): via INTRAVENOUS

## 2015-03-19 MED ORDER — LIDOCAINE HCL (CARDIAC) 20 MG/ML IV SOLN
INTRAVENOUS | Status: DC | PRN
  Administered 2015-03-19: 50 mg via INTRAVENOUS

## 2015-03-19 MED ORDER — PHENYLEPHRINE 40 MCG/ML (10ML) SYRINGE FOR IV PUSH (FOR BLOOD PRESSURE SUPPORT)
PREFILLED_SYRINGE | INTRAVENOUS | Status: AC
  Filled 2015-03-19: qty 10

## 2015-03-19 MED ORDER — PROSIGHT PO TABS
2.0000 | ORAL_TABLET | Freq: Two times a day (BID) | ORAL | Status: DC
  Administered 2015-03-19 – 2015-03-20 (×4): 2 via ORAL
  Filled 2015-03-19 (×7): qty 2

## 2015-03-19 MED ORDER — FENTANYL CITRATE (PF) 100 MCG/2ML IJ SOLN
INTRAMUSCULAR | Status: DC | PRN
  Administered 2015-03-19: 50 ug via INTRAVENOUS
  Administered 2015-03-19: 100 ug via INTRAVENOUS

## 2015-03-19 MED ORDER — SODIUM CHLORIDE 0.9 % IR SOLN
Status: DC | PRN
Start: 2015-03-19 — End: 2015-03-19
  Administered 2015-03-19: 1000 mL

## 2015-03-19 MED ORDER — PROPOFOL 10 MG/ML IV BOLUS
INTRAVENOUS | Status: AC
  Filled 2015-03-19: qty 20

## 2015-03-19 MED ORDER — OXYCODONE-ACETAMINOPHEN 5-325 MG PO TABS: 1.0000 | ORAL_TABLET | Freq: Four times a day (QID) | ORAL | Status: AC | PRN

## 2015-03-19 MED ORDER — CEFUROXIME SODIUM 1.5 G IJ SOLR
INTRAMUSCULAR | Status: AC
  Filled 2015-03-19: qty 1.5

## 2015-03-19 MED ORDER — ENSURE ENLIVE PO LIQD
237.0000 mL | Freq: Two times a day (BID) | ORAL | Status: DC
  Administered 2015-03-20 (×2): 237 mL via ORAL
  Filled 2015-03-19: qty 237

## 2015-03-19 MED ORDER — MORPHINE SULFATE 2 MG/ML IJ SOLN
1.0000 mg | INTRAMUSCULAR | Status: DC | PRN
  Administered 2015-03-19: 1 mg via INTRAVENOUS

## 2015-03-19 MED ORDER — LIDOCAINE HCL (CARDIAC) 20 MG/ML IV SOLN
INTRAVENOUS | Status: AC
  Filled 2015-03-19: qty 5

## 2015-03-19 MED ORDER — METOCLOPRAMIDE HCL 5 MG/ML IJ SOLN: 5.0000 mg | Freq: Three times a day (TID) | INTRAMUSCULAR | Status: DC | PRN

## 2015-03-19 MED ORDER — FLEET ENEMA 7-19 GM/118ML RE ENEM: 1.0000 | ENEMA | Freq: Once | RECTAL | Status: AC | PRN

## 2015-03-19 MED ORDER — ASPIRIN EC 325 MG PO TBEC: 325.0000 mg | DELAYED_RELEASE_TABLET | Freq: Two times a day (BID) | ORAL | Status: AC

## 2015-03-19 MED ORDER — ZOLPIDEM TARTRATE 5 MG PO TABS: 5.0000 mg | ORAL_TABLET | Freq: Every evening | ORAL | Status: DC | PRN

## 2015-03-19 MED ORDER — HYDROMORPHONE HCL 1 MG/ML IJ SOLN
0.5000 mg | INTRAMUSCULAR | Status: DC | PRN
  Administered 2015-03-19: 0.5 mg via INTRAVENOUS
  Filled 2015-03-19: qty 1

## 2015-03-19 MED ORDER — ROCURONIUM BROMIDE 100 MG/10ML IV SOLN
INTRAVENOUS | Status: DC | PRN
  Administered 2015-03-19: 40 mg via INTRAVENOUS

## 2015-03-19 MED ORDER — PHENYLEPHRINE HCL 10 MG/ML IJ SOLN
10.0000 mg | INTRAVENOUS | Status: DC | PRN
  Administered 2015-03-19: 100 ug/min via INTRAVENOUS

## 2015-03-19 MED ORDER — MORPHINE SULFATE 2 MG/ML IJ SOLN
INTRAMUSCULAR | Status: AC
  Filled 2015-03-19: qty 1

## 2015-03-19 MED ORDER — ONDANSETRON HCL 4 MG/2ML IJ SOLN: 4.0000 mg | Freq: Four times a day (QID) | INTRAMUSCULAR | Status: DC | PRN

## 2015-03-19 MED ORDER — DOCUSATE SODIUM 100 MG PO CAPS
100.0000 mg | ORAL_CAPSULE | Freq: Two times a day (BID) | ORAL | Status: DC
  Administered 2015-03-20 – 2015-03-21 (×3): 100 mg via ORAL
  Filled 2015-03-19 (×3): qty 1

## 2015-03-19 MED ORDER — LACTATED RINGERS IV SOLN
INTRAVENOUS | Status: DC | PRN
  Administered 2015-03-19: 12:00:00 via INTRAVENOUS

## 2015-03-19 MED ORDER — NEOSTIGMINE METHYLSULFATE 10 MG/10ML IV SOLN
INTRAVENOUS | Status: DC | PRN
  Administered 2015-03-19: 3 mg via INTRAVENOUS

## 2015-03-19 MED ORDER — GLYCOPYRROLATE 0.2 MG/ML IJ SOLN
INTRAMUSCULAR | Status: DC | PRN
  Administered 2015-03-19: 0.4 mg via INTRAVENOUS

## 2015-03-19 MED ORDER — CEFTRIAXONE SODIUM IN DEXTROSE 20 MG/ML IV SOLN
1.0000 g | Freq: Every day | INTRAVENOUS | Status: DC
  Administered 2015-03-19 (×2): 1 g via INTRAVENOUS
  Filled 2015-03-19 (×3): qty 50

## 2015-03-19 MED ORDER — KCL IN DEXTROSE-NACL 20-5-0.45 MEQ/L-%-% IV SOLN
INTRAVENOUS | Status: DC
  Administered 2015-03-19: 18:00:00 via INTRAVENOUS
  Filled 2015-03-19 (×2): qty 1000

## 2015-03-19 MED ORDER — PROPOFOL 10 MG/ML IV BOLUS
INTRAVENOUS | Status: DC | PRN
  Administered 2015-03-19: 100 mg via INTRAVENOUS

## 2015-03-19 MED ORDER — NEOSTIGMINE METHYLSULFATE 10 MG/10ML IV SOLN
INTRAVENOUS | Status: AC
  Filled 2015-03-19: qty 1

## 2015-03-19 MED ORDER — SENNOSIDES-DOCUSATE SODIUM 8.6-50 MG PO TABS: 1.0000 | ORAL_TABLET | Freq: Every evening | ORAL | Status: DC | PRN

## 2015-03-19 MED ORDER — ONDANSETRON HCL 4 MG/2ML IJ SOLN
INTRAMUSCULAR | Status: DC | PRN
  Administered 2015-03-19: 4 mg via INTRAVENOUS

## 2015-03-19 MED ORDER — ONDANSETRON HCL 4 MG/2ML IJ SOLN
INTRAMUSCULAR | Status: AC
  Filled 2015-03-19: qty 2

## 2015-03-19 MED ORDER — ACETAMINOPHEN 650 MG RE SUPP: 650.0000 mg | Freq: Four times a day (QID) | RECTAL | Status: DC | PRN

## 2015-03-19 MED ORDER — PROMETHAZINE HCL 25 MG/ML IJ SOLN: 6.2500 mg | INTRAMUSCULAR | Status: DC | PRN

## 2015-03-19 MED ORDER — FENTANYL CITRATE (PF) 250 MCG/5ML IJ SOLN
INTRAMUSCULAR | Status: AC
  Filled 2015-03-19: qty 5

## 2015-03-19 MED ORDER — ONDANSETRON HCL 4 MG PO TABS: 4.0000 mg | ORAL_TABLET | Freq: Four times a day (QID) | ORAL | Status: DC | PRN

## 2015-03-19 SURGICAL SUPPLY — 64 items
BAG DECANTER FOR FLEXI CONT (MISCELLANEOUS) ×3 IMPLANT
BANDAGE ELASTIC 6 VELCRO ST LF (GAUZE/BANDAGES/DRESSINGS) ×3 IMPLANT
BIT DRILL CALIBRATED 4.3MMX365 (DRILL) ×1 IMPLANT
BIT DRILL CROWE PNT TWST 4.5MM (DRILL) ×1 IMPLANT
BNDG GAUZE ELAST 4 BULKY (GAUZE/BANDAGES/DRESSINGS) ×3 IMPLANT
BRUSH FEMORAL CANAL (MISCELLANEOUS) IMPLANT
CEMENT HV SMART SET (Cement) ×6 IMPLANT
COVER BACK TABLE 24X17X13 BIG (DRAPES) IMPLANT
CUFF TOURNIQUET SINGLE 24IN (TOURNIQUET CUFF) ×3 IMPLANT
DRAPE IMP U-DRAPE 54X76 (DRAPES) ×3 IMPLANT
DRAPE INCISE IOBAN 66X45 STRL (DRAPES) ×3 IMPLANT
DRAPE ORTHO SPLIT 77X108 STRL (DRAPES) ×4
DRAPE POUCH INSTRU U-SHP 10X18 (DRAPES) ×3 IMPLANT
DRAPE SURG ORHT 6 SPLT 77X108 (DRAPES) ×2 IMPLANT
DRILL CALIBRATED 4.3MMX365 (DRILL) ×3
DRILL CROWE POINT TWIST 4.5MM (DRILL) ×3
DRSG PAD ABDOMINAL 8X10 ST (GAUZE/BANDAGES/DRESSINGS) ×3 IMPLANT
DURAPREP 26ML APPLICATOR (WOUND CARE) ×3 IMPLANT
ELECT CAUTERY BLADE 6.4 (BLADE) ×3 IMPLANT
ELECT REM PT RETURN 9FT ADLT (ELECTROSURGICAL) ×3
ELECTRODE REM PT RTRN 9FT ADLT (ELECTROSURGICAL) ×1 IMPLANT
FILTER STRAW FLUID ASPIR (MISCELLANEOUS) ×3 IMPLANT
GAUZE SPONGE 4X4 12PLY STRL (GAUZE/BANDAGES/DRESSINGS) ×3 IMPLANT
GAUZE XEROFORM 5X9 LF (GAUZE/BANDAGES/DRESSINGS) ×3 IMPLANT
GLOVE BIO SURGEON STRL SZ8.5 (GLOVE) ×6 IMPLANT
GLOVE BIOGEL PI IND STRL 9 (GLOVE) ×1 IMPLANT
GLOVE BIOGEL PI INDICATOR 9 (GLOVE) ×2
GOWN STRL REUS W/ TWL LRG LVL3 (GOWN DISPOSABLE) ×1 IMPLANT
GOWN STRL REUS W/ TWL XL LVL3 (GOWN DISPOSABLE) ×2 IMPLANT
GOWN STRL REUS W/TWL LRG LVL3 (GOWN DISPOSABLE) ×2
GOWN STRL REUS W/TWL XL LVL3 (GOWN DISPOSABLE) ×4
GUIDEPIN 3.2X17.5 THRD DISP (PIN) ×3 IMPLANT
GUIDEWIRE BEAD TIP (WIRE) ×3 IMPLANT
HANDPIECE INTERPULSE COAX TIP (DISPOSABLE)
HOOD PEEL AWAY FACE SHEILD DIS (HOOD) ×6 IMPLANT
IMMOBILIZER KNEE 22 UNIV (SOFTGOODS) ×3 IMPLANT
KIT BASIN OR (CUSTOM PROCEDURE TRAY) ×3 IMPLANT
KIT ROOM TURNOVER OR (KITS) ×3 IMPLANT
MANIFOLD NEPTUNE II (INSTRUMENTS) ×3 IMPLANT
NAIL FEM RETRO 10.5X380 (Nail) ×3 IMPLANT
NS IRRIG 1000ML POUR BTL (IV SOLUTION) ×6 IMPLANT
PACK TOTAL JOINT (CUSTOM PROCEDURE TRAY) ×3 IMPLANT
PACK UNIVERSAL I (CUSTOM PROCEDURE TRAY) ×3 IMPLANT
PAD ARMBOARD 7.5X6 YLW CONV (MISCELLANEOUS) ×6 IMPLANT
SCREW CORT TI DBL LEAD 5X42 (Screw) ×3 IMPLANT
SCREW CORT TI DBL LEAD 5X50 (Screw) ×3 IMPLANT
SCREW CORT TI DBL LEAD 5X65 (Screw) ×3 IMPLANT
SCREW CORT TI DBL LEAD 5X75 (Screw) ×6 IMPLANT
SET HNDPC FAN SPRY TIP SCT (DISPOSABLE) IMPLANT
SPONGE GAUZE 4X4 12PLY STER LF (GAUZE/BANDAGES/DRESSINGS) ×3 IMPLANT
SPONGE LAP 4X18 X RAY DECT (DISPOSABLE) ×3 IMPLANT
SUCTION FRAZIER TIP 10 FR DISP (SUCTIONS) ×3 IMPLANT
SUT ETHIBOND NAB CT1 #1 30IN (SUTURE) IMPLANT
SUT MNCRL AB 4-0 PS2 18 (SUTURE) ×3 IMPLANT
SUT VIC AB 1 CT1 27 (SUTURE) ×4
SUT VIC AB 1 CT1 27XBRD ANBCTR (SUTURE) ×1 IMPLANT
SUT VIC AB 1 CT1 27XBRD ANTBC (SUTURE) ×1 IMPLANT
SUT VIC AB 2-0 CT1 27 (SUTURE) ×2
SUT VIC AB 2-0 CT1 TAPERPNT 27 (SUTURE) ×1 IMPLANT
TOWEL OR 17X24 6PK STRL BLUE (TOWEL DISPOSABLE) ×3 IMPLANT
TOWEL OR 17X26 10 PK STRL BLUE (TOWEL DISPOSABLE) ×3 IMPLANT
TOWER CARTRIDGE SMART MIX (DISPOSABLE) ×3 IMPLANT
TRAY FOLEY CATH 14FR (SET/KITS/TRAYS/PACK) IMPLANT
WATER STERILE IRR 1000ML POUR (IV SOLUTION) ×6 IMPLANT

## 2015-03-19 NOTE — Anesthesia Preprocedure Evaluation (Addendum)
Anesthesia Evaluation  Patient identified by MRN, date of birth, ID band Patient awake    Reviewed: Allergy & Precautions, NPO status , Patient's Chart, lab work & pertinent test results  History of Anesthesia Complications Negative for: history of anesthetic complications  Airway Mallampati: II  TM Distance: >3 FB Neck ROM: Full    Dental no notable dental hx. (+) Dental Advisory Given   Pulmonary neg pulmonary ROS,    Pulmonary exam normal       Cardiovascular hypertension, + Peripheral Vascular Disease Normal cardiovascular exam+ dysrhythmias Atrial Fibrillation     Neuro/Psych  Headaches, PSYCHIATRIC DISORDERS Anxiety Depression Schizophrenia    GI/Hepatic Neg liver ROS, GERD-  ,  Endo/Other  diabetes  Renal/GU negative Renal ROS     Musculoskeletal   Abdominal   Peds  Hematology   Anesthesia Other Findings   Reproductive/Obstetrics                            Anesthesia Physical Anesthesia Plan  ASA: IV  Anesthesia Plan: General   Post-op Pain Management:    Induction: Intravenous  Airway Management Planned: Oral ETT  Additional Equipment:   Intra-op Plan:   Post-operative Plan: Extubation in OR  Informed Consent: I have reviewed the patients History and Physical, chart, labs and discussed the procedure including the risks, benefits and alternatives for the proposed anesthesia with the patient or authorized representative who has indicated his/her understanding and acceptance.   Dental advisory given and Consent reviewed with POA  Plan Discussed with: CRNA, Anesthesiologist and Surgeon  Anesthesia Plan Comments:        Anesthesia Quick Evaluation

## 2015-03-19 NOTE — Progress Notes (Signed)
ANTIBIOTIC CONSULT NOTE - INITIAL  Pharmacy Consult for Ceftriaxone Indication: UTI  Allergies  Allergen Reactions  . Benazepril Hcl Cough  . Epinephrine Hcl Other (See Comments)    REACTION: shakes  . Lidocaine Other (See Comments)    [facility]    Patient Measurements: Weight: 120 lb (54.432 kg)  Vital Signs: Temp: 98.2 F (36.8 C) (07/18 0001) Temp Source: Axillary (07/18 0001) BP: 125/58 mmHg (07/18 0001) Pulse Rate: 70 (07/18 0001) Intake/Output from previous day: 07/17 0701 - 07/18 0700 In: -  Out: 10 [Urine:10] Intake/Output from this shift: Total I/O In: -  Out: 10 [Urine:10]  Labs:  Recent Labs  03/18/15 1853  WBC 14.5*  HGB 10.7*  PLT 257  CREATININE 0.68   CrCl cannot be calculated (Unknown ideal weight.). No results for input(s): VANCOTROUGH, VANCOPEAK, VANCORANDOM, GENTTROUGH, GENTPEAK, GENTRANDOM, TOBRATROUGH, TOBRAPEAK, TOBRARND, AMIKACINPEAK, AMIKACINTROU, AMIKACIN in the last 72 hours.   Microbiology: No results found for this or any previous visit (from the past 720 hour(s)).  Medical History: Past Medical History  Diagnosis Date  . HYPONATREMIA 01/01/2009  . ANEMIA 08/20/2009  . LEUKOCYTOSIS 08/20/2009  . DEPRESSION, MAJOR, RECURRENT 02/01/2009  . Unspecified hearing loss 11/17/2008  . HYPERTENSION 11/17/2008  . TEMPORAL ARTERITIS 08/20/2009  . PYELONEPHRITIS 09/10/2009  . Cellulitis and abscess of upper arm and forearm 04/16/2010  . PRESSURE ULCER BUTTOCK 02/01/2009  . BACK PAIN, LUMBAR 07/29/2010  . WEAKNESS 01/01/2009  . Anorexia 12/19/2008  . Headache(784.0) 04/18/2010  . DIARRHEA, CHRONIC 12/19/2008  . Stress incontinence   . Hx: UTI (urinary tract infection)   . Blood transfusion   . Foot drop, bilateral   . Clostridium difficile colitis     History of  . Pancreatitis     History of  . Anxiety   . DEGENERATIVE JOINT DISEASE 11/17/2008    shoulders, arms  . History of skin cancer   . Dysrhythmia     Hx of paroxysmal a-fib  . H/O  psoriatic arthritis   . History of SIADH   . H/O hypokalemia   . H/O mitral valve prolapse   . 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 GERD (gastroesophageal reflux disease)   . Mitral valve prolapse   . Osteomyelitis of vertebra   . Diabetes mellitus without complication   . Anemia   . Peripheral vascular disease   . Psychosis   . Atrial fibrillation     Assessment: 81 y.o. female presents from SNF after fall. [facility]. To begin Rocephin for UTI.  Goal of Therapy:  Resolution of infection  Plan:  Rocephin 1gm IV q24h Pharmacy will sign off - please reconsult if needed  Christoper Fabianaron Diana Davenport, PharmD, BCPS Clinical pharmacist, pager [phone] 03/19/2015,1:02 AM

## 2015-03-19 NOTE — Op Note (Signed)
Pre Op Dx: Right distal femur periprosthetic fracture badly comminuted with osteoporotic bone  Post Op Dx: Same  Procedure: Open reduction internal fixation of periprosthetic fracture right knee using a 38 cm x 10.5 mm Biomet Phoenix nail with 4 distal locking screws and cement augmentation for the comminution and osteoporosis  Surgeon: Nestor LewandowskyFrank J Paulanthony Gleaves, MD  Assistant: Tomi LikensEric K. [employer]illips PA-C  (present throughout entire procedure and necessary for timely completion of the procedure)  Anesthesia: General  EBL: 300 mL  Fluids: 1800 mL of crystalloid  Tourniquet Time: None  Indications: 81 year old female who is been bedbound for the last couple of years secondary to dementia, severe peripheral vascular disease with loss of her left foot from dry gangrene in her right foot had a second ray resection and she fell out of bed sustaining a right distal femur periprosthetic fracture above a total knee that even placed in [place] 12 years ago cemented. She is knows will be a nonambulator the fracture was badly displaced he can see all the arteries in her leg on the plain x-ray secondary to calcifications. Options were discussed at length with her son is power of attorney these included lateral plating, intramedullary nailing with cement augmentation, and above-knee amputation. The patient does not want an amputation if at all possible. Since she is a nonambulator one consideration would be retrograde nailing with cement augmentation which should tolerate nonambulatory stress and keep the total knee on the femur. Risks and benefits of surgery discussed the patient has been DO NOT RESUSCITATE for the last 3 years.  Procedure: Patient was identified by arm band received preoperative IV antibodies in the holding area at [facility]. She was then taken to operating room 6 the appropriate preanesthetic monitors were attached and general endotracheal anesthesia induced with the patient in the supine position  on the flat Jackson radiolucent table. A tourniquet was applied high to the right thigh and the right lower extremity prepped and draped in usual fashion from the ankle to the tourniquet. A timeout procedure was performed. With the knee over a radiolucent triangle we then re-created the old total knee incision anteriorly and extended proximally for another 4 cm a medial parapatellar arthrotomy was accomplished there was minimal bleeding and the tourniquet was not used. We immediately identified the displaced fracture site which had numerous pieces of cancellus and cortical bone and this fracture was grossly unstable. In addition she had impressive osteoporosis. Utilizing the box the total knee guidewire was then placed up through the fracture site followed by the initiating reamer for the [facility] nail and then sequential reaming up to a 12.5 mm. We measured for a 38 cm nail which was passed up through the box of the total knee femoral component and into the femur to the appropriate depth. A traction applied we then reduced the fracture as much as possible the nail was placed as far distal and the box is possible to get the best purchase of bone and then the 4 crossing screws were placed through the [facility] nail 3 of them were in the metaphyseal and epiphyseal bone the fourth was in the diaphyseal bone. The screws were then locked and despite this there is still some wiggle of the femoral component on the distal femur. At this point we elected to do cement augmentation and a double batch of DePuy HV cement with 1500 mg of Zinacef was mixed and placed in the cement gun and injected up through the box posterior to the nail  and around the flanges of the femoral component and allowed to harden. The femoral component had no visible motion on the femur as we took the knee through range of motion and was now stable. At this point the wound was irrigated out normal saline solution and we closed in layers with running #1  Vicryls suture and the parapatellar arthrotomy and subcutaneous 20 Vicryls suture and 30 Vicryls subcuticular suture. No drain was required. A dressing of Xerofoam 4 x 4 dressing sponges web roll and Ace wrap was applied the patient was awakened extubated and taken to the recovery without difficulty.

## 2015-03-19 NOTE — Discharge Instructions (Signed)
Femur Fracture °A femur fracture is a complete or incomplete break in the thighbone (femur). This is a serious injury, but is uncommon in sports. Usually the ankle, lower leg, or knee will become injured before the thighbone does.  °SYMPTOMS  °· Severe pain in the thigh, at the time of injury. °· Tenderness and inflammation in the thigh. °· Bleeding and bruising in the thigh. °· Inability to bear weight on the injured leg. °· Visible deformity, if the fracture is complete and bone fragments separate enough to distort the leg shape. °· Numbness and coldness in the leg and foot, beyond the fracture site, if blood supply is impaired. °CAUSES  °· A fracture results when the force applied to a bone is greater that the bone can withstand. Thighbone fractures often result from a direct hit (trauma). °· Indirect stress, caused by twisting or violent muscle contraction. °RISK INCREASES WITH:  °· Contact sports (i.e. football, soccer, hockey), motor sports, and track and field events. °· Previous or current bone problems (i.e. osteoporosis, tumors). °· Metabolism disorders or hormone problems. °· Nutrition deficiency or disorder (i.e. anorexia and bulimia). °· Poor strength and flexibility. °PREVENTION  °· Warm up and stretch properly before activity. °· Maintain physical fitness: °¨ Muscle strength. °¨ Endurance and flexibility. °¨ Cardiovascular fitness. °· Wear proper protective equipment (i.e. thigh pads for football or hockey). °PROGNOSIS  °This condition can often be cured with proper treatment, though it may take 6 to 8 weeks to heal.  °RELATED COMPLICATIONS  °· Low blood volume (hypovolemic) shock, due to blood loss in the thigh. °· Failure of bone to heal (nonunion). °· Bone heals in a poor position (malunion). °· Increased pressure inside the leg(compartment syndrome), due to injury that disrupts blood supply to the leg and foot and injures the nerves and muscles of the leg and foot (uncommon). °· Shortening of the  injured bones. °· Increased chance of repeated leg injury. °· Stiff hip or knee. °· Hindrance of normal bone growth in children. °· Risks of surgery: infection, bleeding, injury to nerves (numbness, weakness, paralysis), need for further surgery. °· Infection of open fractures (skin broken over fracture site). °· Bone forming within the muscle (myositis ossificans). °· Longer healing time, if activity is resumed too soon. °TREATMENT  °Treatment first involves the use of ice and medicine to reduce pain and inflammation. Treatment of thighbone fractures often requires surgery, to allow the bone to heal in proper alignment, and to reduce the risk of possible complications. Surgery often involves placing a metal rod down the center of the bone, or fixing plates and screws over the fracture line. Use of a cast is not common, because the cast would need to involve the stomach, low back, pelvis, and extend to the foot. For adults, traction (applying pressure using a device) is not often advised, due to the need for prolonged bed rest (6 to 8 weeks). In certain cases, bone growth stimulators may be advised. After the bone heals (with or without surgery), stretching and strengthening exercise is needed. Exercises may be done at home or with a therapist. The rod, plate, and screws from surgery are only removed if they cause further discomfort.  °MEDICATION  °· If pain medicine is needed, nonsteroidal anti-inflammatory medicines (aspirin and ibuprofen), or other minor pain relievers (acetaminophen), are often advised. °· Do not take pain medicine for 7 days before surgery. °· Stronger pain relievers may be prescribed by your caregiver. Use only as directed and only as much   as you need. °SEEK MEDICAL CARE IF:  °· Symptoms get worse or do not improve in 2 weeks, despite treatment. °· The following occur after restraint or surgery. (Report any of these signs immediately): °¨ Swelling above or below the fracture site. °¨ Severe,  persistent pain. °¨ Blue or gray skin below the fracture site, especially under the toenails. Numbness or loss of feeling below the fracture site. °¨ New, unexplained symptoms develop. (Drugs used in treatment may produce side effects.) °Document Released: 08/18/2005 Document Revised: 11/10/2011 Document Reviewed: 11/30/2008 °ExitCare® Patient Information ©2015 [employer]. This information is not intended to replace advice given to you by your health care provider. Make sure you discuss any questions you have with your health care provider. °

## 2015-03-19 NOTE — Progress Notes (Signed)
[facility] hospital[facility] progress note. Chief complaint. Transfer note. This 81 year old female from skilled nursing home facility fell and was noted to have a deformity of the right distal femur above a total knee replacement done 15-20 years ago. [facility]. Discussion with the patient's son patient [facility]. The patient is now arrived in transfer and I'm seeing her at bedside to ensure she remains clinically stable and that her orders transferred appropriately. Patient is demented and a poor historian. Physical exam. Vital signs. Temperature 98.2, pulse 70, respiration 14, blood pressure 125/58. O2 sats 100%. General appearance. Frail elderly female who is alert but nonverbal. Cardiac. Rate and rhythm regular. Lungs. Breath sounds are clear. Abdomen. Soft with positive bowel sounds. Impression/plan. Problem #1. Right femoral fracture. Plan for operative fixation to be done in the next 24 hours. We'll follow for further orders and recommendations. Patient appears clinically stable post transfer. All orders appear to have transferred appropriately.

## 2015-03-19 NOTE — Consult Note (Signed)
WOC consult requested prior to ortho service involvement.  They are now following for assessment and plan of care;  refer to them for further questions. Please re-consult if further assistance is needed.  Thank-you,  Cammie Mcgeeawn Jarmarcus Wambold MSN, RN, CWOCN, LydiaWCN-AP, CNS [phone]

## 2015-03-19 NOTE — Anesthesia Procedure Notes (Signed)
Procedure Name: Intubation Date/Time: 03/19/2015 12:06 PM Performed by: Orlinda BlalockMCMILLEN, Quitman Norberto L Pre-anesthesia Checklist: Patient identified, Emergency Drugs available, Suction available, Patient being monitored and Timeout performed Patient Re-evaluated:Patient Re-evaluated prior to inductionOxygen Delivery Method: Circle system utilized Preoxygenation: Pre-oxygenation with 100% oxygen Intubation Type: IV induction Ventilation: Mask ventilation without difficulty Laryngoscope Size: Mac and 3 Grade View: Grade I Tube type: Oral Tube size: 7.5 mm Number of attempts: 1 Airway Equipment and Method: Stylet Placement Confirmation: ETT inserted through vocal cords under direct vision,  positive ETCO2 and breath sounds checked- equal and bilateral Secured at: 21 cm Tube secured with: Tape Dental Injury: Teeth and Oropharynx as per pre-operative assessment

## 2015-03-19 NOTE — Progress Notes (Signed)
PROGRESS NOTE    Jo Alvarez KGM:[id number] DOB: 05/21/1925 DOA: 03/18/2015 PCP: Kristian Covey, MD  HPI/Brief narrative 81 year old female, SNF resident, history of dementia, HTN, ischemic colitis, PVD, gangrene of left foot which fell off, chronic anemia, foot drop, osteomyelitis of right second toe status post amputation, presented with right thigh pain following fall at SNF 2 days ago. Patient apparently is nonambulatory at baseline. X-rays revealed right periprosthetic distal femur fracture. Orthopedics consulted and planned ORIF.   Assessment/Plan:  Displaced, distal, right femur periprosthetic fracture - Orthopedics was consulted. Admitting M.D. discussed patient's high risk for surgery given advanced age, frail health and multiple comorbidities, with patient's son. - Patient underwent ORIF 7/18. (Seen prior to surgery) - Postop weightbearing, pain management, DVT prophylaxis and wound care per orthopedics  Temporal arteritis - On chronic prednisone 10 mg daily PTA - Placed on stress dose IV hydrocortisone 50 mg every 8 hourly  Essential hypertension - Controlled on amlodipine, clonidine and metoprolol.  Chronic anemia - EBL at surgery: 300 ML. Expect postop ABLA.? Transfusing in PACU - Follow CBC in a.m. and transfuse if hemoglobin less than 7 g per DL  Moderate dementia - Mental status probably at baseline  PAF - Controlled ventricular rate. Not candidate for GI bleed secondary to frequent falls.  PAD - Left foot fell off spontaneously. Most of the left distal leg stump is healed. Spot of minimal drainage. Wound care consultation.    DVT prophylaxis: Deferred to orthopedics Code Status: DO NOT RESUSCITATE Family Communication: None at bedside Disposition Plan: return to SNF when medically stable.   Consultants:  Orthopedics  Procedures:  ORIF of right distal femur periprosthetic fracture.  Antibiotics:  Perioperative IV Rocephin.    Subjective: Seen this morning prior to surgery. Complained of right thigh pain-worse with movements. No chest pain, dyspnea or palpitations.  Objective: Filed Vitals:   03/19/15 0001 03/19/15 0604 03/19/15 1455 03/19/15 1457  BP: 125/58 126/62  109/73  Pulse: 70 74  88  Temp: 98.2 F (36.8 C) 98.2 F (36.8 C) 97.8 F (36.6 C)   TempSrc: Axillary Axillary    Resp: Weight:      SpO2: 100% 100%  98%    Intake/Output Summary (Last 24 hours) at 03/19/15 1521 Last data filed at 03/19/15 1451  Gross per 24 hour  Intake   1250 ml  Output    735 ml  Net    515 ml   Filed Weights   03/18/15 2208  Weight: 54.432 kg (120 lb)     Exam:  General exam:small built and thinly nourished pleasant elderly female lying comfortably supine in bed. Respiratory system: Clear. No increased work of breathing. Cardiovascular system: S1 & S2 heard, irregularly irregular but rate controlled. No JVD, murmurs, gallops, clicks or pedal edema. Gastrointestinal system: Abdomen is nondistended, soft and nontender. Normal bowel sounds heard. Central nervous system: Alert and oriented to self and partly to place . No focal neurological deficits. Extremities: Symmetric 5 x 5 power. Right lower extremity power assessment limited secondary to pain. Amputated right second toe. Auto amputation of distal left leg with part of stump healed with scar tissue and a small area distally with minimal yellow drainage soaking the dressing.    Data Reviewed: Basic Metabolic Panel:  Recent Labs Lab 03/18/15 1853 03/19/15 0529  NA 137 140  K 4.8 4.4  CL 106 107  CO2 27 26  GLUCOSE 129* 114*  BUN 31* 23*  CREATININE 0.68  0.59  CALCIUM 8.2* 8.3*   Liver Function Tests:  Recent Labs Lab 03/19/15 0529  AST 14*  ALT 12*  ALKPHOS 59  BILITOT 0.6  PROT 5.6*  ALBUMIN 3.0*   No results for input(s): LIPASE, AMYLASE in the last 168 hours. No results for input(s): AMMONIA in the last 168  hours. CBC:  Recent Labs Lab 03/18/15 1853 03/19/15 0529  WBC 14.5* 13.8*  NEUTROABS 12.6*  --   HGB 10.7* 9.7*  HCT 33.4* 31.1*  MCV 95.4 94.5  PLT 257 250   Cardiac Enzymes: No results for input(s): CKTOTAL, CKMB, CKMBINDEX, TROPONINI in the last 168 hours. BNP (last 3 results) No results for input(s): PROBNP in the last 8760 hours. CBG:  Recent Labs Lab 03/19/15 1457  GLUCAP 104*    Recent Results (from the past 240 hour(s))  MRSA PCR Screening     Status: None   Collection Time: 03/19/15  6:25 AM  Result Value Ref Range Status   MRSA by PCR NEGATIVE NEGATIVE Final    Comment:        The GeneXpert MRSA Assay (FDA approved for NASAL specimens only), is one component of a comprehensive MRSA colonization surveillance program. It is not intended to diagnose MRSA infection nor to guide or monitor treatment for MRSA infections.          Studies: Ct Head Wo Contrast  03/18/2015   CLINICAL DATA:  81 year old female with fall and confusion  EXAM: CT HEAD WITHOUT CONTRAST  TECHNIQUE: Contiguous axial images were obtained from the base of the skull through the vertex without intravenous contrast.  COMPARISON:  CT dated 05/29/2011  FINDINGS: The ventricles are dilated and the sulci are prominent compatible with age-related atrophy. Periventricular and deep white matter hypodensities represent chronic microvascular ischemic changes. There is no intracranial hemorrhage. No mass effect or midline shift identified.  The visualized paranasal sinuses and mastoid air cells are well aerated. The calvarium is intact.  Surgical clips noted in the subcutaneous soft tissues of the left parotid area.  IMPRESSION: No acute intracranial pathology.  Age-related atrophy and chronic microvascular ischemic disease.  If symptoms persist and there are no contraindications, MRI may provide better evaluation if clinically indicated.   Electronically Signed   By: Elgie Collard M.D.   On:  03/18/2015 18:51   Dg Chest Port 1 View  03/18/2015   CLINICAL DATA:  Fall at nursing home today with femoral fracture  EXAM: PORTABLE CHEST - 1 VIEW  COMPARISON:  01/29/2013  FINDINGS: Cardiac shadow is mildly prominent. Left basilar scarring is again seen. No focal infiltrate or sizable effusion is noted. No definitive bony abnormality is seen.  IMPRESSION: No active disease.   Electronically Signed   By: Alcide Clever M.D.   On: 03/18/2015 21:43   Dg Femur, Min 2 Views Right  03/18/2015   CLINICAL DATA:  Unwitnessed fall 2 days ago, RIGHT knee swelling  EXAM: RIGHT FEMUR 2 VIEWS  COMPARISON:  None  FINDINGS: Marked osseous demineralization.  RIGHT hip joint space preserved.  Components of a RIGHT knee prosthesis are identified.  Displaced oblique fracture of the distal RIGHT femoral metaphysis extending to the femoral component of the prosthesis.  RIGHT knee/lower leg appear externally rotated approximately 90 degrees.  No additional femoral fracture or dislocation identified.  Extensive atherosclerotic calcifications noted.  IMPRESSION: Osseous demineralization and evidence of a prior RIGHT knee arthroplasty.  Displaced distal RIGHT femoral metaphyseal fracture extending to the femoral component of the prosthesis  with approximately 90 degrees of external rotation of the knee/RIGHT lower leg.   Electronically Signed   By: Ulyses SouthwardMark  Boles M.D.   On: 03/18/2015 18:50        Scheduled Meds: . sodium chloride   Intravenous Once  . [MAR Hold] amLODipine  10 mg Oral Daily  . [MAR Hold] cefTRIAXone (ROCEPHIN)  IV  1 g Intravenous QHS  . [MAR Hold] cloNIDine  0.1 mg Oral Daily  . [MAR Hold] famotidine  20 mg Oral BID  . [START ON 03/20/2015] feeding supplement (ENSURE ENLIVE)  237 mL Oral BID BM  . [MAR Hold] ferrous sulfate  325 mg Oral Q breakfast  . [MAR Hold] gabapentin  300 mg Oral BID  . [MAR Hold] hydrocortisone sod succinate (SOLU-CORTEF) inj  50 mg Intravenous Q8H  . [MAR Hold] leflunomide  15  mg Oral q morning - 10a  . [MAR Hold] metoprolol succinate  12.5 mg Oral Daily  . [MAR Hold] multivitamin  2 tablet Oral BID  . [MAR Hold] polyethylene glycol  17 g Oral QODAY   Continuous Infusions: . sodium chloride 10 mL/hr (03/18/15 2207)    Principal Problem:   Fracture of distal femur Active Problems:   Essential hypertension   TEMPORAL ARTERITIS   Femur fracture   Pressure ulcer    Time spent: 30 minutes.    Marcellus ScottHONGALGI,Sharyl Panchal, MD, FACP, FHM. [facility] Pager [phone]  If 7PM-7AM, please contact night-coverage [email or web address] Password [email or web address] 03/19/2015, 3:21 PM    LOS: 1 day

## 2015-03-19 NOTE — Progress Notes (Signed)
Initial Nutrition Assessment  DOCUMENTATION CODES:   Not applicable  INTERVENTION:   Provide Ensure Enlive po BID, each supplement provides 350 kcal and 20 grams of protein once diet advances.   NUTRITION DIAGNOSIS:   Increased nutrient needs related to wound healing as evidenced by estimated needs.  GOAL:   Patient will meet greater than or equal to 90% of their needs  MONITOR:   Diet advancement, Weight trends, Labs, I & O's  REASON FOR ASSESSMENT:   Consult Assessment of nutrition requirement/status  ASSESSMENT:   81 y.o. female with history of dementia, hypertension, ischemic colitis, peripheral vascular disease, history of gangrene of the left foot which fell off, chronic anemia, foot drop and osteomyelitis of the right foot second toe requiring surgery was brought to the ER after patient was complaining of pain in her right thigh. Presents with fall. X-rays reveal right sided periprosthetic distal femur fracture.  Pt is currently NPO for surgery today. Unable to obtain nutrition hx or perform the nutrition focused physical exam as pt in the OR during time of visit. RD to order Ensure to aid in adequate caloric and protein needs as well as in healing once diet advances.   Labs: Low calcium, ALT, AST. High BUN.  Diet Order:  Diet NPO time specified Except for: Sips with Meds  Skin:   Stage II pressure ulcer on sacrum, wound on L lower leg, and on R big toe and in between 5th and 6th toe, non-pitting RLE edema  Last BM:  PTA  Height:   Ht Readings from Last 1 Encounters:  05/13/13 5\' 4"  (1.626 m)    Weight:   Wt Readings from Last 1 Encounters:  03/18/15 120 lb (54.432 kg)    Ideal Body Weight:  54.5 kg  Wt Readings from Last 10 Encounters:  03/18/15 120 lb (54.432 kg)  05/13/13 120 lb (54.432 kg)  11/30/12 119 lb (53.978 kg)  11/23/12 118 lb 3.2 oz (53.615 kg)  10/22/12 134 lb 11.2 oz (61.1 kg)  03/02/12 122 lb (55.339 kg)  02/25/12 121 lb (54.885  kg)  10/24/11 127 lb (57.607 kg)  10/06/11 132 lb (59.875 kg)  07/25/11 125 lb (56.7 kg)    BMI:  Body mass index is 20.59 kg/(m^2).  Estimated Nutritional Needs:   Kcal:  1650-1850  Protein:  75-85 grams  Fluid:  1.6 - 1.8 L/day  EDUCATION NEEDS:   No education needs identified at this time  Roslyn SmilingStephanie Seynabou Fults, MS, RD, LDN Pager # [phone] After hours/ weekend pager # [phone]

## 2015-03-19 NOTE — Transfer of Care (Signed)
Immediate Anesthesia Transfer of Care Note  Patient: Jo Alvarez  Procedure(s) Performed: Procedure(s): OPEN REDUCTION INTERNAL FIXATION (ORIF) PERIPROSTHETIC FRACTURE (Right)  Patient Location: PACU  Anesthesia Type:General  Level of Consciousness: awake and confused  Airway & Oxygen Therapy: Patient Spontanous Breathing and Patient connected to nasal cannula oxygen  Post-op Assessment: Report given to RN, Post -op Vital signs reviewed and stable and Patient moving all extremities  Post vital signs: Reviewed and stable  Last Vitals:  Filed Vitals:   03/19/15 1455  BP:   Pulse:   Temp: 36.6 C  Resp:     Complications: No apparent anesthesia complications

## 2015-03-19 NOTE — Progress Notes (Signed)
Orthopedic Tech Progress Note Patient Details:  Claris GladdenSarah Beehler 10-01-1924 [id number]  Patient ID: Claris GladdenSarah Fairclough, female   DOB: 10-01-1924, 81 y.o.   MRN: [id number] Pt unable to use trapeze bar patient helper  Nikki DomCrawford, Devyon Keator 03/19/2015, 6:11 AM

## 2015-03-20 ENCOUNTER — Encounter (HOSPITAL_COMMUNITY): Payer: Self-pay | Admitting: Orthopedic Surgery

## 2015-03-20 DIAGNOSIS — D62 Acute posthemorrhagic anemia: Secondary | ICD-10-CM

## 2015-03-20 LAB — URINE CULTURE: CULTURE: NO GROWTH

## 2015-03-20 LAB — BASIC METABOLIC PANEL
Anion gap: 5 (ref 5–15)
BUN: 15 mg/dL (ref 6–20)
CHLORIDE: 108 mmol/L (ref 101–111)
CO2: 22 mmol/L (ref 22–32)
Calcium: 7.8 mg/dL — ABNORMAL LOW (ref 8.9–10.3)
Creatinine, Ser: 0.49 mg/dL (ref 0.44–1.00)
GFR calc Af Amer: >60 mL/min (ref >60)
GFR calc non Af Amer: >60 mL/min (ref >60)
GLUCOSE: 164 mg/dL — AB (ref 65–99)
Potassium: 3.9 mmol/L (ref 3.5–5.1)
Sodium: 135 mmol/L (ref 135–145)

## 2015-03-20 LAB — POCT I-STAT 4, (NA,K, GLUC, HGB,HCT)
Glucose, Bld: 131 mg/dL — ABNORMAL HIGH (ref 65–99)
HCT: 26 % — ABNORMAL LOW (ref 36.0–46.0)
Hemoglobin: 8.8 g/dL — ABNORMAL LOW (ref 12.0–15.0)
Potassium: 3.2 mmol/L — ABNORMAL LOW (ref 3.5–5.1)
SODIUM: 137 mmol/L (ref 135–145)

## 2015-03-20 LAB — CBC
HCT: 23.2 % — ABNORMAL LOW (ref 36.0–46.0)
Hemoglobin: 7.5 g/dL — ABNORMAL LOW (ref 12.0–15.0)
MCH: 30.5 pg (ref 26.0–34.0)
MCHC: 32.3 g/dL (ref 30.0–36.0)
MCV: 94.3 fL (ref 78.0–100.0)
Platelets: 189 10*3/uL (ref 150–400)
RBC: 2.46 MIL/uL — AB (ref 3.87–5.11)
RDW: 14.4 % (ref 11.5–15.5)
WBC: 11.6 10*3/uL — ABNORMAL HIGH (ref 4.0–10.5)

## 2015-03-20 LAB — PREPARE RBC (CROSSMATCH)

## 2015-03-20 MED ORDER — PREDNISONE 10 MG PO TABS
10.0000 mg | ORAL_TABLET | Freq: Every day | ORAL | Status: DC
  Administered 2015-03-21: 10 mg via ORAL
  Filled 2015-03-20: qty 1

## 2015-03-20 MED ORDER — SODIUM CHLORIDE 0.9 % IV SOLN
Freq: Once | INTRAVENOUS | Status: AC
  Administered 2015-03-20: 14:00:00 via INTRAVENOUS

## 2015-03-20 NOTE — Progress Notes (Signed)
PATIENT ID: Jo GladdenSarah Alvarez  MRN: [id number]  DOB/AGE:  79-Jul-1926 / 81 y.o.  1 Day Post-Op Procedure(s) (LRB): OPEN REDUCTION INTERNAL FIXATION (ORIF) PERIPROSTHETIC FRACTURE (Right)    PROGRESS NOTE Subjective:   Patient is alert, but has obvious dementia, no Nausea, no Vomiting, yes passing gas, yes Bowel Movement at time of surgery. Denies SOB, Chest or Calf Pain. Using Incentive Spirometer, PAS in place. Ambulate Non weight bearing, Patient reports pain as mild,  Objective: Vital signs in last 24 hours: Temp:  [97.7 F (36.5 C)-98.4 F (36.9 C)] 98.4 F (36.9 C) (07/19 0546) Pulse Rate:  [87-92] 92 (07/19 0546) Resp:  [13-16] 16 (07/19 0546) BP: (109-135)/(45-84) 135/54 mmHg (07/19 0546) SpO2:  [98 %-100 %] 100 % (07/19 0546)    Intake/Output from previous day: I/O last 3 completed shifts: In: 1961.7 [I.V.:1461.7; IV Piggyback:500] Out: 885 [Urine:635; Blood:250]   Intake/Output this shift:     LABORATORY DATA:  Recent Labs  03/18/15 1853 03/19/15 0529 03/19/15 1316 03/19/15 1457 03/20/15 0508  WBC 14.5* 13.8*  --   --  11.6*  HGB 10.7* 9.7* 8.8*  --  7.5*  HCT 33.4* 31.1* 26.0*  --  23.2*  PLT 257 250  --   --  189  NA 137 140 137  --  135  K 4.8 4.4 3.2*  --  3.9  CL 106 107  --   --  108  CO2 27 26  --   --  22  BUN 31* 23*  --   --  15  CREATININE 0.68 0.59  --   --  0.49  GLUCOSE 129* 114* 131*  --  164*  GLUCAP  --   --   --  104*  --   INR 1.13  --   --   --   --   CALCIUM 8.2* 8.3*  --   --  7.8*    Examination: Neurologically intact Neurovascular intact Sensation intact distally No cellulitis present Compartment soft pt unable to follow cues to dorsiflex or plantar flex.}  Assessment:   1 Day Post-Op Procedure(s) (LRB): OPEN REDUCTION INTERNAL FIXATION (ORIF) PERIPROSTHETIC FRACTURE (Right) ADDITIONAL DIAGNOSIS:  Anemia, will plan to transfuse 2 units  Plan:  Non Weight Bearing (NWB)  DVT Prophylaxis:  Aspirin  DISCHARGE PLAN: Skilled  Nursing Facility/Rehab  DISCHARGE NEEDS: HHPT, HHRN and 3-in-1 comode seat     Jo Alvarez R 03/20/2015, 8:15 AM

## 2015-03-20 NOTE — Clinical Social Work Note (Signed)
Clinical Social Work Assessment  Patient Details  Name: Jo GladdenSarah Alvarez MRN: [id number] Date of Birth: 1924/12/12  Date of referral:  03/20/15               Reason for consult:  Facility Placement, Discharge Planning (Admitted from facility)                Permission sought to share information with:  Facility Medical sales representativeContact Representative, Family Supports Permission granted to share information::  Yes, Verbal Permission Granted  Name::     Rogene HoustonDon Collins  Agency::  [facility]  Relationship::  Patient's son  Contact Information:  [phone]  Housing/Transportation Living arrangements for the past 2 months:  Skilled Nursing Facility [facility]) Source of Information:  Adult Children, Facility Patient Interpreter Needed:  None Criminal Activity/Legal Involvement Pertinent to Current Situation/Hospitalization:  No - Comment as needed Significant Relationships:  Adult Children Lives with:  Facility Resident Do you feel safe going back to the place where you live?  Yes (Patient's son agreeable to patient returning at discharge.) Need for family participation in patient care:  Yes (Comment) (Patient's son active in patient's care.)  Care giving concerns:  Patient's son expressed no concerns at this time.   Social Worker assessment / plan:  CSW received [facility] CSW spoke with patient's son regarding discharge disposition. Per patient's son, [facility] and patient's son anticipates patient to return once medically stable. CS[facility]. CSW to continue to follow and assist with discharge once medically stable for discharge.  Employment status:  Retired Health and safety inspectornsurance information:  Medicare PT Recommendations:  Not assessed at this time Information / Referral to community resources:  Skilled Nursing  Facility  Patient/Family's Response to care:  Patient's son understanding and agreeable to CSW plan of care.  Patient/Family's Understanding of and Emotional Response to Diagnosis, Current Treatment, and Prognosis:  Patient's son understanding and agreeable to CSW plan of care.  Emotional Assessment Appearance:  Other (Comment Required (CSW spoke with patient's son regarding discharge.) Attitude/Demeanor/Rapport:  Other (CSW spoke with patient's son regarding discharge.) Affect (typically observed):  Other (CSW spoke with patient's son regarding discharge.) Orientation:  Oriented to  Time, Oriented to Self Alcohol / Substance use:  Not Applicable Psych involvement (Current and /or in the community):  No (Comment) (Not appropriate on this admission.)  Discharge Needs  Concerns to be addressed:  No discharge needs identified Readmission within the last 30 days:  No Current discharge risk:  None Barriers to Discharge:  No Barriers Identified   Rod MaeVaughn, Bettylou Frew S, LCSW 03/20/2015, 3:29 PM [phone]

## 2015-03-20 NOTE — Care Management Note (Signed)
Case Management Note  Patient Details  Name: Claris GladdenSarah Robinson MRN: [id number] Date of Birth: Nov 29, 1924  Subjective/Objective:     81 yr old female s/p ORIF of right periprostetic fracture               Action/Plan:   Expected Discharge Date:                  Expected Discharge Plan:     In-House Referral:     Discharge planning Services     Post Acute Care Choice:    Choice offered to:     DME Arranged:    DME Agency:     HH Arranged:    HH Agency:     Status of Service:     Medicare Important Message Given:  Yes-second notification given Date Medicare IM Given:    Medicare IM give by:    Date Additional Medicare IM Given:    Additional Medicare Important Message give by:     If discussed at Long Length of Stay Meetings, dates discussed:    Additional Comments:  Durenda GuthrieBrady, Corrie Reder Naomi, RN 03/20/2015, 12:36 PM

## 2015-03-20 NOTE — Evaluation (Signed)
Physical Therapy Evaluation Patient Details Name: Jo Alvarez MRN: [id number] DOB: Apr 19, 1925 Today's Date: 03/20/2015   History of Present Illness  81 year old female, SNF resident, history of dementia, HTN, ischemic colitis, PVD, gangrene of left foot which fell off, chronic anemia, foot drop, osteomyelitis of right second toe status post amputation, presented with right thigh pain following fall and X-rays revealed right periprosthetic distal femur fracture. Pt s/p ORIF peri-prosthetic fracture (7/18).  Clinical Impression  Pt admitted with above diagnosis. Pt currently with functional limitations due to the deficits listed below (see PT Problem List). Pt will benefit from skilled PT to increase their independence and safety with mobility to allow discharge to the venue listed below.  Limited evaluation due to pain level and dementia.  Pt was only able to tolerate rolling to L with total A with increased agitation and cursing/swatting at therapist. Will follow acutely for bed mobility to increase mobility to allow for decreased caregiver burden with hygiene and for pressure relief. Recommend returning to SNF.     Follow Up Recommendations SNF    Equipment Recommendations  None recommended by PT    Recommendations for Other Services       Precautions / Restrictions Restrictions Weight Bearing Restrictions: Yes RLE Weight Bearing: Non weight bearing      Mobility  Bed Mobility Overal bed mobility: Needs Assistance Bed Mobility: Rolling Rolling: Total assist         General bed mobility comments: Pt resistant to any movement.  Performed small roll to the L to position pillow behind pt for pressure reduction on buttocks. Pt yelling out and cursing at therapist.  Transfers                    Ambulation/Gait                Stairs            Wheelchair Mobility    Modified Rankin (Stroke Patients Only)       Balance Overall balance assessment:  History of Falls                                           Pertinent Vitals/Pain Pain Assessment: Faces Faces Pain Scale: Hurts worst Pain Location: B legs Pain Descriptors / Indicators: Moaning;Grimacing;Guarding;Other (Comment) Pain Intervention(s): Repositioned;Other (comment);Patient requesting pain meds-RN notified;Relaxation (offered ice, but pt refused)    Home Living Family/patient expects to be discharged to:: Skilled nursing facility                      Prior Function           Comments: Unable to get clear picture of PLOF.  Called facility but unable to speak with anyone regarding if pt got up in w/c or not. Per notes she was non-ambulatory and possibly bedbound.     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  Attempted to speak with rehab department at current SNF and unable to speak with anyone regarding PLOF.    Exercises        Assessment/Plan    PT Assessment Patient needs continued PT services  PT Diagnosis Acute pain;Generalized weakness   PT Problem List Decreased activity tolerance;Pain;Decreased mobility;Decreased cognition  PT Treatment Interventions Functional mobility training;Therapeutic activities;Therapeutic exercise   PT Goals (Current goals can be found in the Care Plan section) Acute Rehab PT Goals PT  Goal Formulation: Patient unable to participate in goal setting Time For Goal Achievement: 03/27/15 Potential to Achieve Goals: Fair    Frequency Min 2X/week   Barriers to discharge        Co-evaluation               End of Session   Activity Tolerance: Patient limited by pain;Treatment limited secondary to agitation Patient left: in bed;with call bell/phone within reach;with bed alarm set Nurse Communication: Mobility status;Patient requests pain meds;Weight bearing status         Time: 1035-1047 PT Time Calculation (min) (ACUTE ONLY): 12 min   Charges:   PT Evaluation $Initial PT Evaluation Tier I: 1 Procedure     PT G Codes:        Jo Alvarez 03/20/2015, 12:38 PM

## 2015-03-20 NOTE — Care Management Important Message (Signed)
Important Message  Patient Details  Name: Jo GladdenSarah Alvarez MRN: [id number] Date of Birth: 1925-01-12   Medicare Important Message Given:  Yes-second notification given    Kyla BalzarineShealy, Deadrick Stidd Abena 03/20/2015, 12:17 PMImportant Message  Patient Details  Name: Jo GladdenSarah Alvarez MRN: [id number] Date of Birth: 1925-01-12   Medicare Important Message Given:  Yes-second notification given    Kyla BalzarineShealy, Joseeduardo Brix Abena 03/20/2015, 12:17 PM

## 2015-03-20 NOTE — Progress Notes (Signed)
Orders for administration PRBC's, IV site malpositioned Lt AC. Sent ticket to IV team start new IV site for administration of blood.

## 2015-03-20 NOTE — Progress Notes (Signed)
PROGRESS NOTE    Arvella Massingale ZOX:[id number] DOB: 06-21-1925 DOA: 03/18/2015 PCP: Kristian Covey, MD  HPI/Brief narrative 81 year old female, SNF resident, history of dementia, HTN, ischemic colitis, PVD, gangrene of left foot which fell off, chronic anemia, foot drop, osteomyelitis of right second toe status post amputation, presented with right thigh pain following fall at SNF 2 days ago. Patient apparently is nonambulatory at baseline. X-rays revealed right periprosthetic distal femur fracture. Orthopedics consulted and planned ORIF.   Assessment/Plan:  Displaced, distal, right femur periprosthetic fracture, s/p ORIF 7/18 - Orthopedics was consulted. Admitting M.D. discussed patient's high risk for surgery given advanced age, frail health and multiple comorbidities, with patient's son. - Status post ORIF 7/18 - As per orthopedics, nonweightbearing and aspirin for DVT prophylaxis. SNF at discharge. - No UTI on urine culture. DC Rocephin.  Temporal arteritis - On chronic prednisone 10 mg daily PTA - Placed on stress dose IV hydrocortisone 50 mg every 8 hourly. Transition back to home dose of prednisone.  Essential hypertension - Controlled on amlodipine, clonidine and metoprolol.  Postop acute blood loss anemia complicating Chronic anemia - EBL at surgery: 300 ML. Expect postop ABLA.? Transfusing in PACU - Hemoglobin: 7.5 - Orthopedics planning to transfuse 2 units of PRBC on 7/19  Moderate dementia - Mental status probably at baseline  PAF - Controlled ventricular rate. Not candidate for GI bleed secondary to frequent falls.  PAD - Left foot fell off spontaneously. Most of the left distal leg stump is healed. Spot of minimal drainage. Wound care consultation - deferred to Ortho for Mx.    DVT prophylaxis: Deferred to orthopedics: Currently SCDs and ASA at discharge Code Status: DO NOT RESUSCITATE Family Communication: None at bedside Disposition Plan: return to SNF  when medically stable.   Consultants:  Orthopedics  Procedures:  ORIF of right distal femur periprosthetic fracture 7/18.  Antibiotics:  Perioperative IV Rocephin.   Subjective: Appropriate RLE pain post op. Denies any other complaints.  Objective: Filed Vitals:   03/20/15 0131 03/20/15 0546 03/20/15 1300 03/20/15 1417  BP: 131/84 135/54 94/37 98/36   Pulse: 92 92 81 78  Temp: 98.4 F (36.9 C) 98.4 F (36.9 C) 98.2 F (36.8 C) 98.6 F (37 C)  TempSrc:      Resp: 16 16 14 16   Weight:      SpO2: 100% 100% 97% 96%    Intake/Output Summary (Last 24 hours) at 03/20/15 1442 Last data filed at 03/20/15 1030  Gross per 24 hour  Intake 1581.67 ml  Output    875 ml  Net 706.67 ml   Filed Weights   03/18/15 2208  Weight: 54.432 kg (120 lb)     Exam:  General exam:small built and thinly nourished pleasant elderly female lying comfortably supine in bed. Respiratory system: Clear. No increased work of breathing. Cardiovascular system: S1 & S2 heard, irregularly irregular but rate controlled. No JVD, murmurs, gallops, clicks or pedal edema. Gastrointestinal system: Abdomen is nondistended, soft and nontender. Normal bowel sounds heard. Central nervous system: Alert and oriented to self and partly to place . No focal neurological deficits. Extremities: Symmetric 5 x 5 power. Right lower extremity power assessment limited secondary to pain. Amputated right second toe. Auto amputation of distal left leg -dressing dry and clean. Operative site right knee dressing clean and dry.   Data Reviewed: Basic Metabolic Panel:  Recent Labs Lab 03/18/15 1853 03/19/15 0529 03/19/15 1316 03/20/15 0508  NA 137 140 137 135  K 4.8 4.4 3.2*  3.9  CL 106 107  --  108  CO2 27 26  --  22  GLUCOSE 129* 114* 131* 164*  BUN 31* 23*  --  15  CREATININE 0.68 0.59  --  0.49  CALCIUM 8.2* 8.3*  --  7.8*   Liver Function Tests:  Recent Labs Lab 03/19/15 0529  AST 14*  ALT 12*    ALKPHOS 59  BILITOT 0.6  PROT 5.6*  ALBUMIN 3.0*   No results for input(s): LIPASE, AMYLASE in the last 168 hours. No results for input(s): AMMONIA in the last 168 hours. CBC:  Recent Labs Lab 03/18/15 1853 03/19/15 0529 03/19/15 1316 03/20/15 0508  WBC 14.5* 13.8*  --  11.6*  NEUTROABS 12.6*  --   --   --   HGB 10.7* 9.7* 8.8* 7.5*  HCT 33.4* 31.1* 26.0* 23.2*  MCV 95.4 94.5  --  94.3  PLT 257 250  --  189   Cardiac Enzymes: No results for input(s): CKTOTAL, CKMB, CKMBINDEX, TROPONINI in the last 168 hours. BNP (last 3 results) No results for input(s): PROBNP in the last 8760 hours. CBG:  Recent Labs Lab 03/19/15 1457  GLUCAP 104*    Recent Results (from the past 240 hour(s))  Culture, Urine     Status: None   Collection Time: 03/18/15 10:03 PM  Result Value Ref Range Status   Specimen Description URINE, CATHETERIZED  Final   Special Requests NONE  Final   Culture   Final    NO GROWTH 1 DAY Performed at [facility]    Report Status 03/20/2015 FINAL  Final  MRSA PCR Screening     Status: None   Collection Time: 03/19/15  6:25 AM  Result Value Ref Range Status   MRSA by PCR NEGATIVE NEGATIVE Final    Comment:        The GeneXpert MRSA Assay (FDA approved for NASAL specimens only), is one component of a comprehensive MRSA colonization surveillance program. It is not intended to diagnose MRSA infection nor to guide or monitor treatment for MRSA infections.          Studies: Ct Head Wo Contrast  03/18/2015   CLINICAL DATA:  81 year old female with fall and confusion  EXAM: CT HEAD WITHOUT CONTRAST  TECHNIQUE: Contiguous axial images were obtained from the base of the skull through the vertex without intravenous contrast.  COMPARISON:  CT dated 05/29/2011  FINDINGS: The ventricles are dilated and the sulci are prominent compatible with age-related atrophy. Periventricular and deep white matter hypodensities represent chronic microvascular  ischemic changes. There is no intracranial hemorrhage. No mass effect or midline shift identified.  The visualized paranasal sinuses and mastoid air cells are well aerated. The calvarium is intact.  Surgical clips noted in the subcutaneous soft tissues of the left parotid area.  IMPRESSION: No acute intracranial pathology.  Age-related atrophy and chronic microvascular ischemic disease.  If symptoms persist and there are no contraindications, MRI may provide better evaluation if clinically indicated.   Electronically Signed   By: Elgie Collard M.D.   On: 03/18/2015 18:51   Dg Chest Port 1 View  03/18/2015   CLINICAL DATA:  Fall at nursing home today with femoral fracture  EXAM: PORTABLE CHEST - 1 VIEW  COMPARISON:  01/29/2013  FINDINGS: Cardiac shadow is mildly prominent. Left basilar scarring is again seen. No focal infiltrate or sizable effusion is noted. No definitive bony abnormality is seen.  IMPRESSION: No active disease.   Electronically Signed  By: Mark  Lukens M.D.   On: 03/18/2015 21:43   Dg Knee RightAlcide Clever Port  03/19/2015   CLINICAL DATA:  Status post open reduction with internal fixation of right knee fracture.  EXAM: PORTABLE RIGHT KNEE - 1-2 VIEW  COMPARISON:  March 18, 2015.  FINDINGS: Patient is status post intra medullary rod fixation but distal right femoral fracture. Significantly improved alignment of fracture components is noted. The femoral and tibial components of right knee arthroplasty are intact. Vascular calcifications are noted.  IMPRESSION: Status post intra medullary rod fixation of distal right femoral fracture with improved alignment of fracture components.   Electronically Signed   By: Lupita RaiderJames  Green Jr, M.D.   On: 03/19/2015 15:46   Dg Femur, Min 2 Views Right  03/18/2015   CLINICAL DATA:  Unwitnessed fall 2 days ago, RIGHT knee swelling  EXAM: RIGHT FEMUR 2 VIEWS  COMPARISON:  None  FINDINGS: Marked osseous demineralization.  RIGHT hip joint space preserved.  Components of a  RIGHT knee prosthesis are identified.  Displaced oblique fracture of the distal RIGHT femoral metaphysis extending to the femoral component of the prosthesis.  RIGHT knee/lower leg appear externally rotated approximately 90 degrees.  No additional femoral fracture or dislocation identified.  Extensive atherosclerotic calcifications noted.  IMPRESSION: Osseous demineralization and evidence of a prior RIGHT knee arthroplasty.  Displaced distal RIGHT femoral metaphyseal fracture extending to the femoral component of the prosthesis with approximately 90 degrees of external rotation of the knee/RIGHT lower leg.   Electronically Signed   By: Ulyses SouthwardMark  Boles M.D.   On: 03/18/2015 18:50        Scheduled Meds: . amLODipine  10 mg Oral Daily  . cefTRIAXone (ROCEPHIN)  IV  1 g Intravenous QHS  . cloNIDine  0.1 mg Oral Daily  . docusate sodium  100 mg Oral BID  . famotidine  20 mg Oral BID  . feeding supplement (ENSURE ENLIVE)  237 mL Oral BID BM  . ferrous sulfate  325 mg Oral Q breakfast  . gabapentin  300 mg Oral BID  . hydrocortisone sod succinate (SOLU-CORTEF) inj  50 mg Intravenous Q8H  . leflunomide  15 mg Oral q morning - 10a  . metoprolol succinate  12.5 mg Oral Daily  . multivitamin  2 tablet Oral BID  . polyethylene glycol  17 g Oral QODAY   Continuous Infusions: . dextrose 5 % and 0.45 % NaCl with KCl 20 mEq/L 100 mL/hr at 03/19/15 1753    Principal Problem:   Fracture of distal femur Active Problems:   Essential hypertension   TEMPORAL ARTERITIS   Femur fracture   Pressure ulcer    Time spent: 20 minutes.    Marcellus ScottHONGALGI,Gustie Bobb, MD, FACP, FHM. [facility] Pager [phone]  If 7PM-7AM, please contact night-coverage [email or web address] Password [email or web address] 03/20/2015, 2:42 PM    LOS: 2 days

## 2015-03-20 NOTE — Anesthesia Postprocedure Evaluation (Signed)
  Anesthesia Post-op Note  Patient: Jo GladdenSarah Alvarez  Procedure(s) Performed: Procedure(s): OPEN REDUCTION INTERNAL FIXATION (ORIF) PERIPROSTHETIC FRACTURE (Right)  Patient Location: PACU  Anesthesia Type:General  Level of Consciousness: awake  Airway and Oxygen Therapy: Patient Spontanous Breathing and Patient connected to nasal cannula oxygen  Post-op Pain: none  Post-op Assessment: Post-op Vital signs reviewed, Patient's Cardiovascular Status Stable, Respiratory Function Stable, Patent Airway, No signs of Nausea or vomiting and Pain level controlled              Post-op Vital Signs: Reviewed and stable  Last Vitals:  Filed Vitals:   03/20/15 0546  BP: 135/54  Pulse: 92  Temp: 36.9 C  Resp: 16    Complications: No apparent anesthesia complications

## 2015-03-21 DIAGNOSIS — I10 Essential (primary) hypertension: Secondary | ICD-10-CM

## 2015-03-21 DIAGNOSIS — I739 Peripheral vascular disease, unspecified: Secondary | ICD-10-CM

## 2015-03-21 DIAGNOSIS — S72401A Unspecified fracture of lower end of right femur, initial encounter for closed fracture: Secondary | ICD-10-CM

## 2015-03-21 DIAGNOSIS — M316 Other giant cell arteritis: Secondary | ICD-10-CM

## 2015-03-21 LAB — CBC
HCT: 29.7 % — ABNORMAL LOW (ref 36.0–46.0)
Hemoglobin: 9.9 g/dL — ABNORMAL LOW (ref 12.0–15.0)
MCH: 30.4 pg (ref 26.0–34.0)
MCHC: 33.3 g/dL (ref 30.0–36.0)
MCV: 91.1 fL (ref 78.0–100.0)
Platelets: 146 10*3/uL — ABNORMAL LOW (ref 150–400)
RBC: 3.26 MIL/uL — ABNORMAL LOW (ref 3.87–5.11)
RDW: 16 % — ABNORMAL HIGH (ref 11.5–15.5)
WBC: 9.5 10*3/uL (ref 4.0–10.5)

## 2015-03-21 LAB — TYPE AND SCREEN
ABO/RH(D): O POS
ANTIBODY SCREEN: NEGATIVE
UNIT DIVISION: 0
UNIT DIVISION: 0
Unit division: 0
Unit division: 0

## 2015-03-21 MED ORDER — SENNOSIDES-DOCUSATE SODIUM 8.6-50 MG PO TABS: 1.0000 | ORAL_TABLET | Freq: Every day | ORAL | Status: AC

## 2015-03-21 NOTE — Discharge Planning (Signed)
[facility]. Patient's son updated at bedside.  [facility] RN report number:[phone] Transportation: EMS ([address])  Marcelline Deistmily Rastus [place], [place] - [phone] Clinical Social Work Department Orthopedics [phone]([id number]) and Surgical [phone]([id number])

## 2015-03-21 NOTE — Discharge Summary (Addendum)
Discharge Summary  Jo Alvarez WUJ:[id number] DOB: [birth year]  PCP: Kristian Covey, MD  Admit date: 03/18/2015 Discharge date: 03/21/2015  Time spent: >28mins  Recommendations for Outpatient Follow-up:  1. F/u with PMD within a week, pmd to repeat cbc/bmp 2. SNF to arrange podiatry appointment for right foot. 3. F/u with orthopedics as scheduled.  Discharge Diagnoses:  Active Hospital Problems   Diagnosis Date Noted  . Fracture of distal femur 03/18/2015  . Pressure ulcer 03/19/2015  . Femur fracture 03/18/2015  . TEMPORAL ARTERITIS 08/20/2009  . Essential hypertension 11/17/2008    Resolved Hospital Problems   Diagnosis Date Noted Date Resolved  No resolved problems to display.    Discharge Condition: stable  Diet recommendation: heart healthy/carb modified  Filed Weights   03/18/15 2208  Weight: 54.432 kg (120 lb)    History of present illness:  Jo Alvarez is a 81 y.o. female with history of dementia, hypertension, ischemic colitis, peripheral vascular disease, history of gangrene of the left foot which fell off, chronic anemia, foot drop and osteomyelitis of the right foot second toe requiring surgery was brought to the ER after patient was complaining of pain in her right thigh. As per the son who provided most of the history patient probably had a fall at the nursing home 2 days ago. Patient is usually nonambulatory. X-rays reveal right sided periprosthetic distal femur fracture. On-call orthopedic surgeon Dr. Turner Daniels has been consulted and patient has been admitted for further management. As per the son patient did not have any nausea vomiting abdominal pain diarrhea chest pain or shortness of breath. Patient has dementia and does not provide much to the history.   Hospital Course:  Principal Problem:   Fracture of distal femur Active Problems:   Essential hypertension   TEMPORAL ARTERITIS   Femur fracture   Pressure ulcer  Displaced, distal, right femur  periprosthetic fracture, s/p ORIF 7/18 - Orthopedics was consulted. Admitting M.D. discussed patient's high risk for surgery given advanced age, frail health and multiple comorbidities, with patient's son. - Status post ORIF 7/18 - As per orthopedics, nonweightbearing and aspirin for DVT prophylaxis. SNF at discharge. - No UTI on urine culture. DC Rocephin.  Temporal arteritis - On chronic prednisone 10 mg daily PTA - Placed on stress dose IV hydrocortisone 50 mg every 8 hourly. T -ransition back to home dose of prednisone.  Essential hypertension - Controlled on amlodipine, clonidine and metoprolol.  Postop acute blood loss anemia complicating Chronic anemia - EBL at surgery: 300 ML. Expect postop ABLA.? Transfusing in PACU - Hemoglobin: 7.5 - Orthopedics ordered transfuse 2 units of PRBC on 7/19 -h/h stable, post transfusion. pmd to repeat cbc at hospital discharge follow up.  Moderate dementia - Mental status probably at baseline -oriented to person only  PAF - Controlled ventricular rate. Not candidate for GI bleed secondary to frequent falls.  PAD - Left foot fell off spontaneously. Most of the left distal leg stump is healed. Spot of minimal drainage.  -Wound care consultation - deferred to Ortho for Mx. 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  sacral decubitus ulcer   DVT prophylaxis: Deferred to orthopedics: Currently SCDs and ASA at discharge Code Status: DO NOT RESUSCITATE Family Communication: None at bedside Disposition Plan: return to SNF    Consultants:  Orthopedics  Procedures:  ORIF of right distal femur periprosthetic fracture 7/18.  Antibiotics:  Perioperative IV Rocephin.   Discharge Exam: BP 129/44 mmHg  Pulse 67  Temp(Src) 98.2 F (36.8 C) (Axillary)   Resp 16  Wt 54.432 kg (120 lb)  SpO2 100%  General: alert, oriented to person only Cardiovascular: RRR Respiratory: CTABL Extremities:  Auto amputation of distal left leg -dressing dry and clean. Operative site right leg dressing clean and dry. On knee immobilizer  Discharge Instructions You were cared for by a hospitalist during your hospital stay. If you have any questions about your discharge medications or the care you received while you were in the hospital after you are discharged, you can call the unit and asked to speak with the hospitalist on call if the hospitalist that took care of you is not available. Once you are discharged, your primary care physician will handle any further medical issues. Please note that NO REFILLS for any discharge medications will be authorized once you are discharged, as it is imperative that you return to your primary care physician (or establish a relationship with a primary care physician if you do not have one) for your aftercare needs so that they can reassess your need for medications and monitor your lab values.  Discharge Instructions    Diet - low sodium heart healthy    Complete by:  As directed      Increase activity slowly    Complete by:  As directed      Partial weight bearing    Complete by:  As directed   % Body Weight:  touch toe  Laterality:  right  Extremity:  Lower            Medication List    TAKE these medications        acetaminophen 325 MG tablet  Commonly known as:  TYLENOL  Take 650 mg by mouth every 4 (four) hours as needed for fever.     amLODipine 10 MG tablet  Commonly known as:  NORVASC  Take 1 tablet (10 mg total) by mouth daily.     aspirin EC 325 MG tablet  Take 1 tablet (325 mg total) by mouth 2 (two) times daily.     calcium-vitamin D 500-200 MG-UNIT per tablet  Take 1 tablet by mouth 2 (two) times daily with a meal.     cloNIDine 0.1 MG tablet  Commonly known as:  CATAPRES  Take 1 tablet (0.1 mg  total) by mouth daily.     Cranberry 425 MG Caps  Take 425 mg by mouth 2 (two) times daily.     diazepam 5 MG tablet  Commonly known as:  VALIUM  Take 1 tablet (5 mg total) by mouth every 12 (twelve) hours as needed. anxiety     ENSURE  Take 237 mLs by mouth 3 (three) times daily between meals.     ferrous sulfate 325 (65 FE) MG tablet  Take 325 mg by mouth daily with breakfast.     gabapentin 300 MG capsule  Commonly known as:  NEURONTIN  Take 300 mg by mouth 2 (two) times daily.     leflunomide 10 MG tablet  Commonly known as:  ARAVA  Take 15 mg by mouth every morning.     LORazepam 2 MG/ML concentrated solution  Commonly known as:  ATIVAN  Inject 2 mg into the muscle See admin instructions. Give 30 minutes prior to dental appointments     LORazepam 1 MG  tablet  Commonly known as:  ATIVAN  Take 1 mg by mouth every 8 (eight) hours as needed for anxiety.     metoprolol succinate 12.5 mg Tb24 24 hr tablet  Commonly known as:  TOPROL-XL  Take 0.5 tablets (12.5 mg total) by mouth daily.     nystatin cream  Commonly known as:  MYCOSTATIN  Apply 1 application topically 2 (two) times daily as needed for dry skin.     ondansetron 4 MG tablet  Commonly known as:  ZOFRAN  Take 4 mg by mouth every 8 (eight) hours as needed for nausea or vomiting.     oxyCODONE-acetaminophen 5-325 MG per tablet  Commonly known as:  ROXICET  Take 1 tablet by mouth every 4 (four) hours as needed for pain.     oxyCODONE-acetaminophen 5-325 MG per tablet  Commonly known as:  PERCOCET/ROXICET  Take 1 tablet by mouth every 6 (six) hours as needed for severe pain.     oxyCODONE-acetaminophen 10-325 MG per tablet  Commonly known as:  PERCOCET  Take 1 tablet by mouth every 12 (twelve) hours.     polyethylene glycol packet  Commonly known as:  MIRALAX / GLYCOLAX  Take 17 g by mouth every other day.     predniSONE 10 MG tablet  Commonly known as:  DELTASONE  Take 1 tablet (10 mg total) by mouth  daily.     PRESERVISION AREDS Caps  Take 2 capsules by mouth 2 (two) times daily.     multivitamins ther. w/minerals Tabs tablet  Take 1 tablet by mouth daily.     ranitidine 150 MG tablet  Commonly known as:  ZANTAC  Take 1 tablet (150 mg total) by mouth 2 (two) times daily. For reflux.     senna-docusate 8.6-50 MG per tablet  Commonly known as:  Senokot-S  Take 1 tablet by mouth at bedtime.     traMADol 50 MG tablet  Commonly known as:  ULTRAM  Take 50 mg by mouth every 6 (six) hours as needed for pain.     vitamin C 500 MG tablet  Commonly known as:  ASCORBIC ACID  Take 500 mg by mouth 2 (two) times daily.       Allergies  Allergen Reactions  . Benazepril Hcl Cough  . Epinephrine Hcl Other (See Comments)    REACTION: shakes  . Lidocaine Other (See Comments)    Per MAR       Follow-up Information    Follow up with Nestor Lewandowsky, MD In 2 weeks.   Specialty:  Orthopedic Surgery   Contact information:   [address] [place] [address] [phone]       Follow up with Kristian Covey, MD In 1 week.   Specialty:  Family Medicine   Why:  hospital discharge follow up, to repeat cbc/bmp in one week   Contact information:   [address]ena Flake [facility] [place] [address] [phone]        The results of significant diagnostics from this hospitalization (including imaging, microbiology, ancillary and laboratory) are listed below for reference.    Significant Diagnostic Studies: Ct Head Wo Contrast  03/18/2015   CLINICAL DATA:  81 year old female with fall and confusion  EXAM: CT HEAD WITHOUT CONTRAST  TECHNIQUE: Contiguous axial images were obtained from the base of the skull through the vertex without intravenous contrast.  COMPARISON:  CT dated 05/29/2011  FINDINGS: The ventricles are dilated and the sulci are prominent compatible with age-related atrophy. Periventricular and  deep white matter hypodensities represent chronic microvascular ischemic changes.  There is no intracranial hemorrhage. No mass effect or midline shift identified.  The visualized paranasal sinuses and mastoid air cells are well aerated. The calvarium is intact.  Surgical clips noted in the subcutaneous soft tissues of the left parotid area.  IMPRESSION: No acute intracranial pathology.  Age-related atrophy and chronic microvascular ischemic disease.  If symptoms persist and there are no contraindications, MRI may provide better evaluation if clinically indicated.   Electronically Signed   By: Elgie Collard M.D.   On: 03/18/2015 18:51   Dg Chest Port 1 View  03/18/2015   CLINICAL DATA:  Fall at nursing home today with femoral fracture  EXAM: PORTABLE CHEST - 1 VIEW  COMPARISON:  01/29/2013  FINDINGS: Cardiac shadow is mildly prominent. Left basilar scarring is again seen. No focal infiltrate or sizable effusion is noted. No definitive bony abnormality is seen.  IMPRESSION: No active disease.   Electronically Signed   By: Alcide Clever M.D.   On: 03/18/2015 21:43   Dg Knee Right Port  03/19/2015   CLINICAL DATA:  Status post open reduction with internal fixation of right knee fracture.  EXAM: PORTABLE RIGHT KNEE - 1-2 VIEW  COMPARISON:  March 18, 2015.  FINDINGS: Patient is status post intra medullary rod fixation but distal right femoral fracture. Significantly improved alignment of fracture components is noted. The femoral and tibial components of right knee arthroplasty are intact. Vascular calcifications are noted.  IMPRESSION: Status post intra medullary rod fixation of distal right femoral fracture with improved alignment of fracture components.   Electronically Signed   By: Lupita Raider, M.D.   On: 03/19/2015 15:46   Dg Femur, Min 2 Views Right  03/18/2015   CLINICAL DATA:  Unwitnessed fall 2 days ago, RIGHT knee swelling  EXAM: RIGHT FEMUR 2 VIEWS  COMPARISON:  None  FINDINGS: Marked osseous demineralization.  RIGHT hip joint space preserved.  Components of a RIGHT knee  prosthesis are identified.  Displaced oblique fracture of the distal RIGHT femoral metaphysis extending to the femoral component of the prosthesis.  RIGHT knee/lower leg appear externally rotated approximately 90 degrees.  No additional femoral fracture or dislocation identified.  Extensive atherosclerotic calcifications noted.  IMPRESSION: Osseous demineralization and evidence of a prior RIGHT knee arthroplasty.  Displaced distal RIGHT femoral metaphyseal fracture extending to the femoral component of the prosthesis with approximately 90 degrees of external rotation of the knee/RIGHT lower leg.   Electronically Signed   By: Ulyses Southward M.D.   On: 03/18/2015 18:50    Microbiology: Recent Results (from the past 240 hour(s))  Culture, Urine     Status: None   Collection Time: 03/18/15 10:03 PM  Result Value Ref Range Status   Specimen Description URINE, CATHETERIZED  Final   Special Requests NONE  Final   Culture   Final    NO GROWTH 1 DAY Performed at [facility]    Report Status 03/20/2015 FINAL  Final  MRSA PCR Screening     Status: None   Collection Time: 03/19/15  6:25 AM  Result Value Ref Range Status   MRSA by PCR NEGATIVE NEGATIVE Final    Comment:        The GeneXpert MRSA Assay (FDA approved for NASAL specimens only), is one component of a comprehensive MRSA colonization surveillance program. It is not intended to diagnose MRSA infection nor to guide or monitor treatment for MRSA infections.  Labs: Basic Metabolic Panel:  Recent Labs Lab 03/18/15 1853 03/19/15 0529 03/19/15 1316 03/20/15 0508  NA 137 140 137 135  K 4.8 4.4 3.2* 3.9  CL 106 107  --  108  CO2 27 26  --  22  GLUCOSE 129* 114* 131* 164*  BUN 31* 23*  --  15  CREATININE 0.68 0.59  --  0.49  CALCIUM 8.2* 8.3*  --  7.8*   Liver Function Tests:  Recent Labs Lab 03/19/15 0529  AST 14*  ALT 12*  ALKPHOS 59  BILITOT 0.6  PROT 5.6*  ALBUMIN 3.0*   No results for input(s): LIPASE,  AMYLASE in the last 168 hours. No results for input(s): AMMONIA in the last 168 hours. CBC:  Recent Labs Lab 03/18/15 1853 03/19/15 0529 03/19/15 1316 03/20/15 0508 03/21/15 0440  WBC 14.5* 13.8*  --  11.6* 9.5  NEUTROABS 12.6*  --   --   --   --   HGB 10.7* 9.7* 8.8* 7.5* 9.9*  HCT 33.4* 31.1* 26.0* 23.2* 29.7*  MCV 95.4 94.5  --  94.3 91.1  PLT 257 250  --  189 146*   Cardiac Enzymes: No results for input(s): CKTOTAL, CKMB, CKMBINDEX, TROPONINI in the last 168 hours. BNP: BNP (last 3 results) No results for input(s): BNP in the last 8760 hours.  ProBNP (last 3 results) No results for input(s): PROBNP in the last 8760 hours.  CBG:  Recent Labs Lab 03/19/15 1457  GLUCAP 104*       Signed:  Lashunda Greis MD, PhD  [facility] 03/21/2015, 10:25 AM

## 2015-03-21 NOTE — Progress Notes (Signed)
Patient left via [facility] on stretcher to skilled facility @1610 . Repeated attempts to call [facility] - operator would pass this RN through to "the floor", phone would ring > 10-15 times w/o staff answering x 4. On 5th attempt, operator put me on hold, a clinician did pick up - report given.

## 2015-03-21 NOTE — Progress Notes (Signed)
PATIENT ID: Jo GladdenSarah Alvarez  MRN: [id number]  DOB/AGE:  Jan 16, 1925 / 81 y.o.  2 Days Post-Op Procedure(s) (LRB): OPEN REDUCTION INTERNAL FIXATION (ORIF) PERIPROSTHETIC FRACTURE (Right)    PROGRESS NOTE Subjective:   Patient is alert, but obvious dementia no Nausea, no Vomiting, yes passing gas, yes Bowel Movement. Taking PO sips. Denies SOB, Chest or Calf Pain. Using Incentive Spirometer, PAS in place. Ambulate Nonweight bearing, Patient reports pain as moderate and severe,   Objective: Vital signs in last 24 hours: Temp:  [97.9 F (36.6 C)-98.8 F (37.1 C)] 98.2 F (36.8 C) (07/20 0415) Pulse Rate:  [67-90] 67 (07/20 0415) Resp:  [14-18] 16 (07/20 0415) BP: (94-152)/(36-74) 129/44 mmHg (07/20 0415) SpO2:  [96 %-100 %] 100 % (07/20 0415)    Intake/Output from previous day: I/O last 3 completed shifts: In: 1904.7 [P.O.:480; I.V.:711.7; Blood:713] Out: 1575 [Urine:1575]   Intake/Output this shift:     LABORATORY DATA:  Recent Labs  03/18/15 1853 03/19/15 0529 03/19/15 1316 03/19/15 1457 03/20/15 0508 03/21/15 0440  WBC 14.5* 13.8*  --   --  11.6* 9.5  HGB 10.7* 9.7* 8.8*  --  7.5* 9.9*  HCT 33.4* 31.1* 26.0*  --  23.2* 29.7*  PLT 257 250  --   --  189 146*  NA 137 140 137  --  135  --   K 4.8 4.4 3.2*  --  3.9  --   CL 106 107  --   --  108  --   CO2 27 26  --   --  22  --   BUN 31* 23*  --   --  15  --   CREATININE 0.68 0.59  --   --  0.49  --   GLUCOSE 129* 114* 131*  --  164*  --   GLUCAP  --   --   --  104*  --   --   INR 1.13  --   --   --   --   --   CALCIUM 8.2* 8.3*  --   --  7.8*  --     Examination: Neurovascular intact Intact pulses distally No cellulitis present Compartment soft} Right leg has obvious pain with any motion.  Readjusted knee immobilizer as it has slipped down leg.  Assessment:   2 Days Post-Op Procedure(s) (LRB): OPEN REDUCTION INTERNAL FIXATION (ORIF) PERIPROSTHETIC FRACTURE (Right) ADDITIONAL DIAGNOSIS:  Acute Blood Loss Anemia  and dementia, dry gangreen left leg  Plan:  Non Weight Bearing (NWB)  DVT Prophylaxis:  Aspirin  DISCHARGE PLAN: Skilled Nursing Facility/Rehab  DISCHARGE NEEDS: HHPT, HHRN and 3-in-1 comode seat     Ilamae Geng R 03/21/2015, 7:56 AM

## 2015-03-22 LAB — TYPE AND SCREEN
ABO/RH(D): O POS
Antibody Screen: NEGATIVE
UNIT DIVISION: 0
UNIT DIVISION: 0
UNIT DIVISION: 0
Unit division: 0

## 2016-06-01 DEATH — deceased

## 2016-12-09 IMAGING — CR DG FEMUR 2+V*R*
5 series · 5 of 5 positions shown · non-contrast
Comparison: None

CLINICAL DATA: Unwitnessed fall 2 days ago, RIGHT knee swelling

EXAM:
RIGHT FEMUR 2 VIEWS

[x femur proximal ap right (1 of 2)]
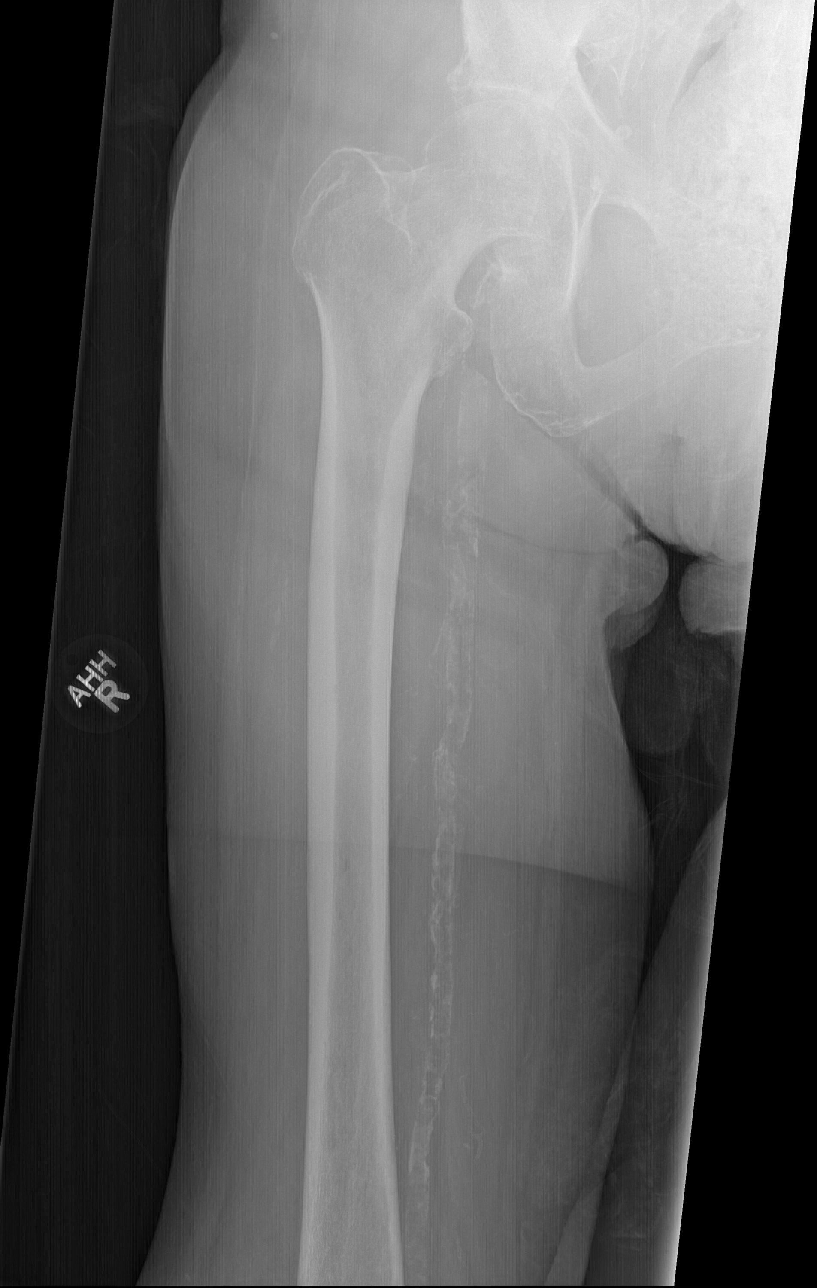

[x femur proximal ap right (2 of 2)]
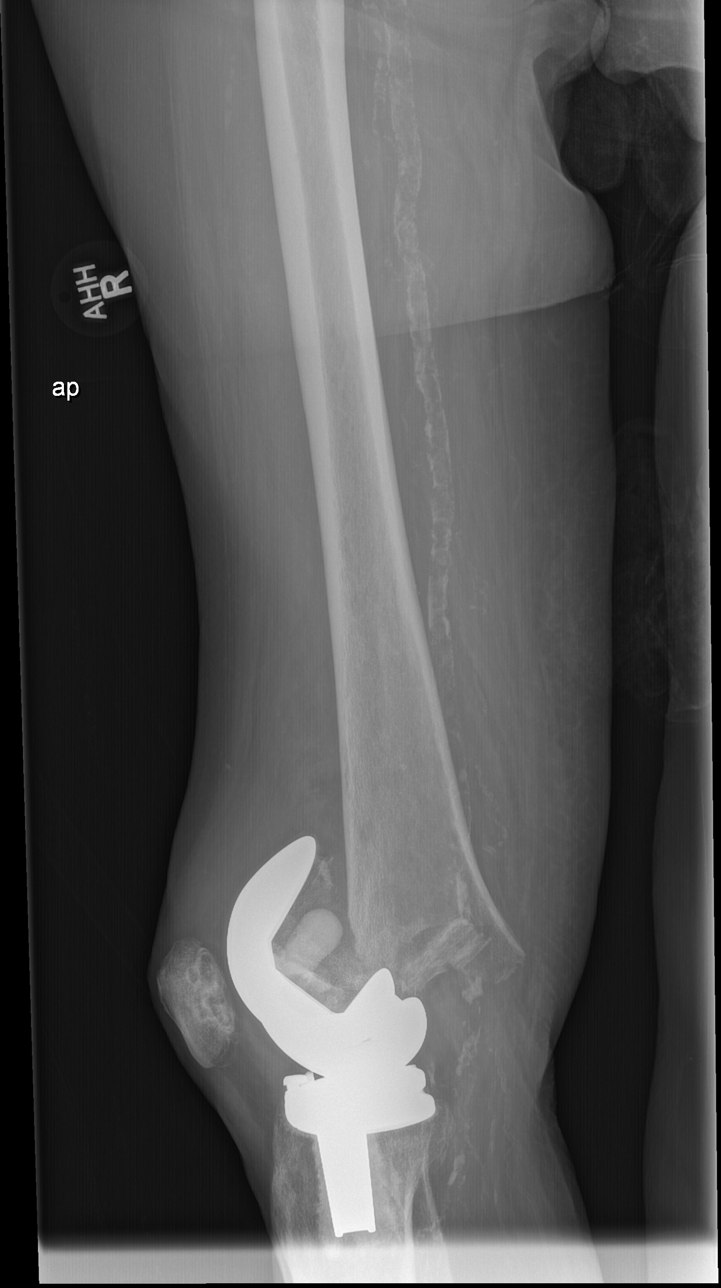

[x femur distal lat right]
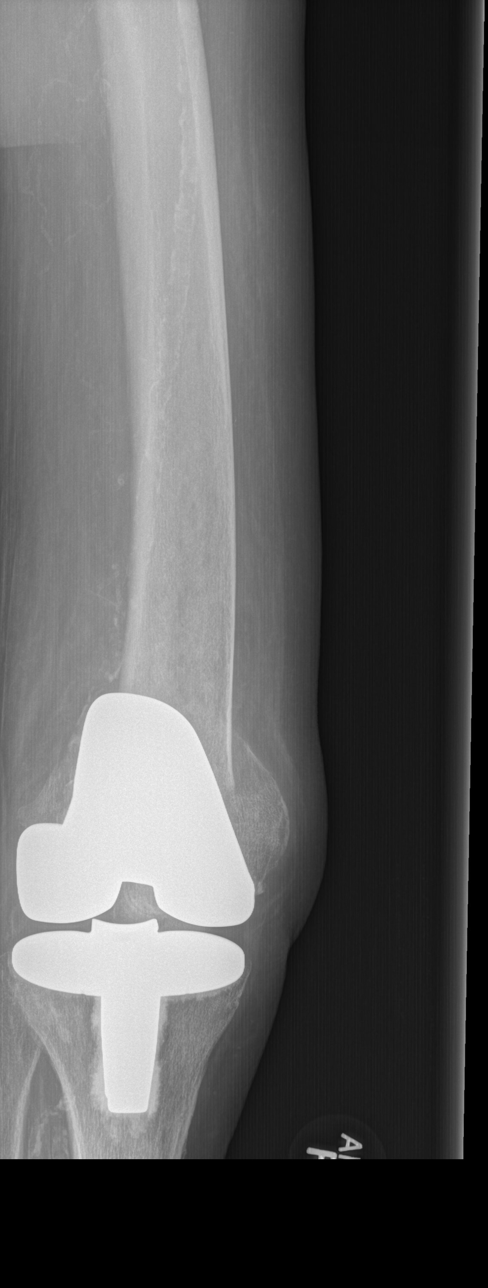

[w femur distal lat right (1 of 2)]
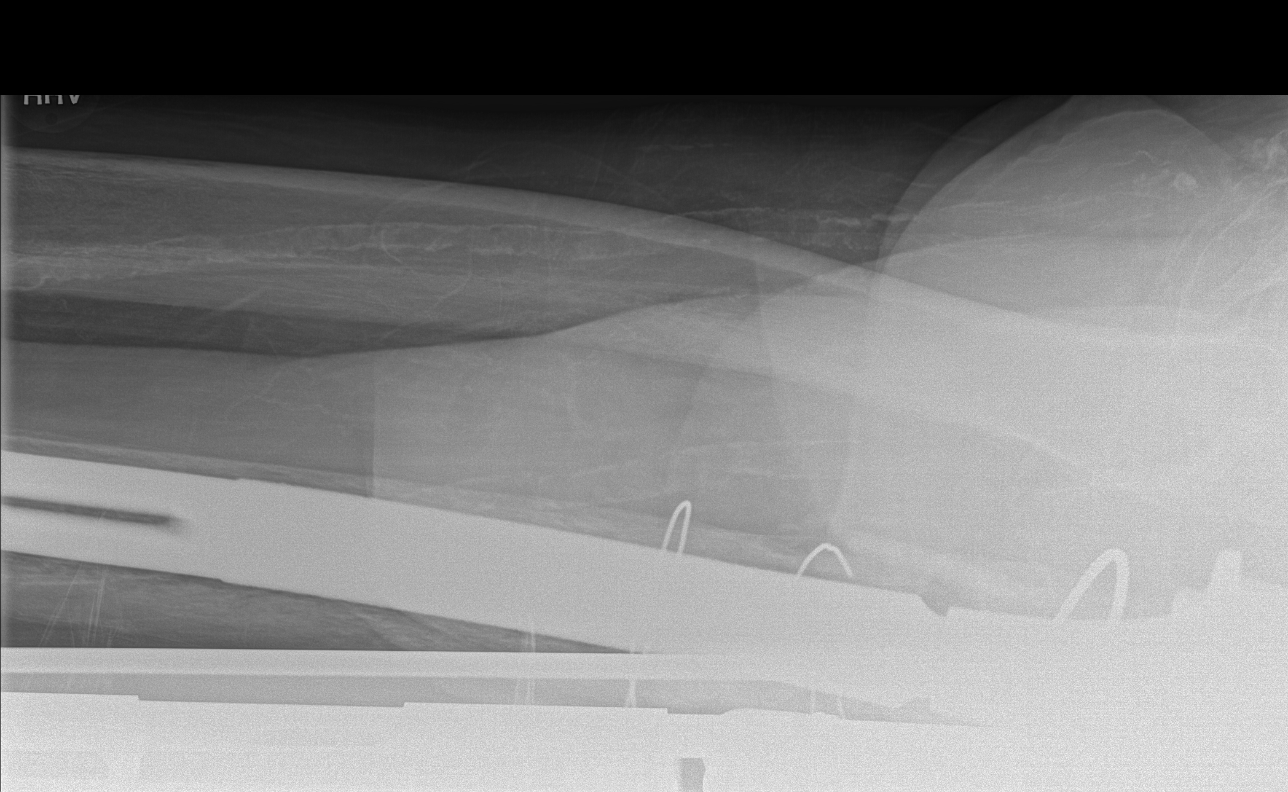

[w femur distal lat right (2 of 2)]
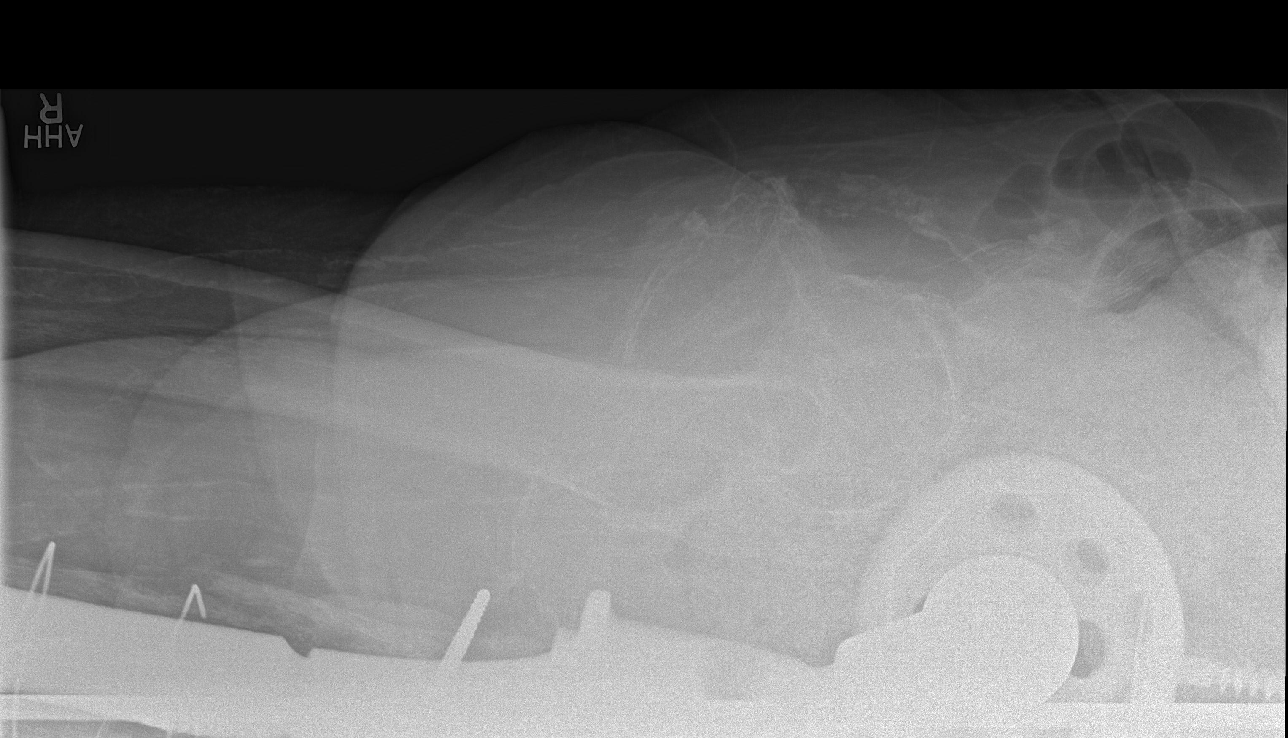

[5 of 5 positions shown; findings below may reference images not displayed]

FINDINGS: Marked osseous demineralization.

RIGHT hip joint space preserved.

Components of a RIGHT knee prosthesis are identified.

Displaced oblique fracture of the distal RIGHT femoral metaphysis
extending to the femoral component of the prosthesis.

RIGHT knee/lower leg appear externally rotated approximately 90
degrees.

No additional femoral fracture or dislocation identified.

Extensive atherosclerotic calcifications noted.
IMPRESSION: Osseous demineralization and evidence of a prior RIGHT knee
arthroplasty.

Displaced distal RIGHT femoral metaphyseal fracture extending to the
femoral component of the prosthesis with approximately 90 degrees of
external rotation of the knee/RIGHT lower leg.

## 2024-03-18 ENCOUNTER — Other Ambulatory Visit (HOSPITAL_COMMUNITY): Payer: Self-pay
